# Patient Record
Sex: Male | Born: 1940 | Race: White | Hispanic: No | Smoking: Former smoker
Health system: Southern US, Community
[De-identification: ages and names within clinical notes are randomized; demographics above are authoritative.]

## PROBLEM LIST (undated history)

## (undated) DIAGNOSIS — N189 Chronic kidney disease, unspecified: Secondary | ICD-10-CM

## (undated) DIAGNOSIS — C801 Malignant (primary) neoplasm, unspecified: Secondary | ICD-10-CM

## (undated) DIAGNOSIS — J45909 Unspecified asthma, uncomplicated: Secondary | ICD-10-CM

## (undated) DIAGNOSIS — I10 Essential (primary) hypertension: Secondary | ICD-10-CM

## (undated) DIAGNOSIS — I509 Heart failure, unspecified: Secondary | ICD-10-CM

## (undated) DIAGNOSIS — J449 Chronic obstructive pulmonary disease, unspecified: Secondary | ICD-10-CM

## (undated) DIAGNOSIS — K709 Alcoholic liver disease, unspecified: Secondary | ICD-10-CM

## (undated) DIAGNOSIS — E119 Type 2 diabetes mellitus without complications: Secondary | ICD-10-CM

## (undated) DIAGNOSIS — I739 Peripheral vascular disease, unspecified: Secondary | ICD-10-CM

## (undated) DIAGNOSIS — Z944 Liver transplant status: Secondary | ICD-10-CM

## (undated) DIAGNOSIS — K219 Gastro-esophageal reflux disease without esophagitis: Secondary | ICD-10-CM

## (undated) DIAGNOSIS — E785 Hyperlipidemia, unspecified: Secondary | ICD-10-CM

## (undated) HISTORY — PX: HERNIA REPAIR: SHX51

## (undated) HISTORY — DX: Liver transplant status: Z94.4

## (undated) HISTORY — DX: Chronic obstructive pulmonary disease, unspecified: J44.9

## (undated) HISTORY — DX: Heart failure, unspecified: I50.9

## (undated) HISTORY — PX: IR ANGIOGRAM EXTREMITY BILATERAL: IMG653

## (undated) HISTORY — PX: CAROTID ENDARTERECTOMY: SUR193

## (undated) HISTORY — PX: TONSILLECTOMY: SUR1361

## (undated) HISTORY — DX: Unspecified asthma, uncomplicated: J45.909

## (undated) HISTORY — PX: CHOLECYSTECTOMY: SHX55

## (undated) HISTORY — DX: Hyperlipidemia, unspecified: E78.5

## (undated) HISTORY — DX: Chronic kidney disease, unspecified: N18.9

## (undated) HISTORY — DX: Type 2 diabetes mellitus without complications: E11.9

---

## 1975-11-01 HISTORY — PX: VASECTOMY: SHX75

## 2001-10-31 HISTORY — PX: LIVER TRANSPLANT: SHX410

## 2011-07-18 DIAGNOSIS — E119 Type 2 diabetes mellitus without complications: Secondary | ICD-10-CM | POA: Insufficient documentation

## 2011-07-18 DIAGNOSIS — K7689 Other specified diseases of liver: Secondary | ICD-10-CM | POA: Insufficient documentation

## 2011-07-18 DIAGNOSIS — Z944 Liver transplant status: Secondary | ICD-10-CM | POA: Insufficient documentation

## 2011-07-18 DIAGNOSIS — E1121 Type 2 diabetes mellitus with diabetic nephropathy: Secondary | ICD-10-CM | POA: Insufficient documentation

## 2011-08-12 DIAGNOSIS — N1832 Chronic kidney disease, stage 3b: Secondary | ICD-10-CM | POA: Insufficient documentation

## 2011-08-12 DIAGNOSIS — F329 Major depressive disorder, single episode, unspecified: Secondary | ICD-10-CM | POA: Insufficient documentation

## 2011-08-12 DIAGNOSIS — M109 Gout, unspecified: Secondary | ICD-10-CM | POA: Insufficient documentation

## 2011-08-12 DIAGNOSIS — E785 Hyperlipidemia, unspecified: Secondary | ICD-10-CM | POA: Insufficient documentation

## 2011-08-12 DIAGNOSIS — F39 Unspecified mood [affective] disorder: Secondary | ICD-10-CM | POA: Insufficient documentation

## 2011-08-12 DIAGNOSIS — I10 Essential (primary) hypertension: Secondary | ICD-10-CM | POA: Insufficient documentation

## 2011-08-14 DIAGNOSIS — F17210 Nicotine dependence, cigarettes, uncomplicated: Secondary | ICD-10-CM | POA: Insufficient documentation

## 2011-08-14 DIAGNOSIS — E669 Obesity, unspecified: Secondary | ICD-10-CM | POA: Insufficient documentation

## 2011-08-14 DIAGNOSIS — C4490 Unspecified malignant neoplasm of skin, unspecified: Secondary | ICD-10-CM | POA: Insufficient documentation

## 2011-08-14 DIAGNOSIS — M8580 Other specified disorders of bone density and structure, unspecified site: Secondary | ICD-10-CM | POA: Insufficient documentation

## 2015-08-20 DIAGNOSIS — I739 Peripheral vascular disease, unspecified: Secondary | ICD-10-CM | POA: Insufficient documentation

## 2017-01-05 DIAGNOSIS — D849 Immunodeficiency, unspecified: Secondary | ICD-10-CM | POA: Insufficient documentation

## 2018-10-10 ENCOUNTER — Other Ambulatory Visit (INDEPENDENT_AMBULATORY_CARE_PROVIDER_SITE_OTHER): Payer: Self-pay | Admitting: Nurse Practitioner

## 2018-10-10 ENCOUNTER — Ambulatory Visit (INDEPENDENT_AMBULATORY_CARE_PROVIDER_SITE_OTHER): Payer: Medicare Other | Admitting: Nurse Practitioner

## 2018-10-10 ENCOUNTER — Other Ambulatory Visit: Payer: Self-pay

## 2018-10-10 ENCOUNTER — Ambulatory Visit (INDEPENDENT_AMBULATORY_CARE_PROVIDER_SITE_OTHER): Payer: Medicare Other

## 2018-10-10 ENCOUNTER — Encounter (INDEPENDENT_AMBULATORY_CARE_PROVIDER_SITE_OTHER): Payer: Self-pay | Admitting: Nurse Practitioner

## 2018-10-10 VITALS — BP 140/75 | HR 97 | Resp 18 | Ht 67.0 in | Wt 210.8 lb

## 2018-10-10 DIAGNOSIS — E785 Hyperlipidemia, unspecified: Secondary | ICD-10-CM

## 2018-10-10 DIAGNOSIS — I739 Peripheral vascular disease, unspecified: Secondary | ICD-10-CM | POA: Diagnosis not present

## 2018-10-10 DIAGNOSIS — F17200 Nicotine dependence, unspecified, uncomplicated: Secondary | ICD-10-CM | POA: Diagnosis not present

## 2018-10-10 DIAGNOSIS — I10 Essential (primary) hypertension: Secondary | ICD-10-CM

## 2018-10-10 NOTE — Progress Notes (Signed)
Subjective:    Patient ID: Brian Pacheco, male    DOB: 1941-01-11, 77 y.o.   MRN: [id number] Chief Complaint  Patient presents with  . New Patient (Initial Visit)    consult    HPI  Brian Pacheco is a 77 y.o. male that is referred by Dr. Jimmie Molly for complaints of bilateral lower extremity claudication.  The patient is relocating from [facility] and has an extensive vascular history.  The patient states that he has had stents placed in both lower extremities.  He states that a stent in his right lower extremity was placed approximately 8 years ago and that he had 2-3 placed in his left.  He also had a recent left carotid endarterectomy which he estimates have been in early 2018 or late 2017.  The patient is seen for evaluation of painful lower extremities and diminished pulses. Patient notes the pain is always associated with activity and is very consistent day today. Typically, the pain occurs at less than one block, progress is as activity continues to the point that the patient must stop walking. Resting including standing still for several minutes allowed resumption of the activity and the ability to walk a similar distance before stopping again. Uneven terrain and inclined shorten the distance. The pain has been progressive over the past several years. The patient states the inability to walk is now having a profound negative impact on quality of life and daily activities.  The patient denies rest pain or dangling of an extremity off the side of the bed during the night for relief. No open wounds or sores at this time.  No history of back problems or DJD of the lumbar sacral spine.   The patient denies changes in claudication symptoms or new rest pain symptoms.  No new ulcers or wounds of the foot.  The patient is a current smoker.  The patient's blood pressure has been stable and relatively well controlled. The patient denies amaurosis fugax or recent TIA symptoms. There are no  recent neurological changes noted. The patient denies history of DVT, PE or superficial thrombophlebitis. The patient denies recent episodes of angina or shortness of breath.   The patient underwent bilateral ABIs today which revealed an ABI of 0.84 on the right and an ABI of 0.47 on the left.  His TBI's were 0.49 on the right and 0.00 on the left.  His tibial waveforms in the left lower extremity were monophasic with a nearly flat toe waveform.  The right lower extremity tibials have biphasic waveforms. Past Medical History:  Diagnosis Date  . Asthma   . Diabetes mellitus without complication ([place])   . Hyperlipidemia     Past Surgical History:  Procedure Laterality Date  . APPENDECTOMY    . CAROTID ENDARTERECTOMY    . IR ANGIOGRAM EXTREMITY BILATERAL Bilateral    patient states he has stents in both legs    Social History   Socioeconomic History  . Marital status: Unknown    Spouse name: Not on file  . Number of children: Not on file  . Years of education: Not on file  . Highest education level: Not on file  Occupational History  . Not on file  Social Needs  . Financial resource strain: Not on file  . Food insecurity:    Worry: Not on file    Inability: Not on file  . Transportation needs:    Medical: Not on file    Non-medical: Not on file  Tobacco  Use  . Smoking status: Current Every Day Smoker  . Smokeless tobacco: Never Used  Substance and Sexual Activity  . Alcohol use: Not on file  . Drug use: Not on file  . Sexual activity: Not on file  Lifestyle  . Physical activity:    Days per week: Not on file    Minutes per session: Not on file  . Stress: Not on file  Relationships  . Social connections:    Talks on phone: Not on file    Gets together: Not on file    Attends religious service: Not on file    Active member of club or organization: Not on file    Attends meetings of clubs or organizations: Not on file    Relationship status: Not on file  . Intimate  partner violence:    Fear of current or ex partner: Not on file    Emotionally abused: Not on file    Physically abused: Not on file    Forced sexual activity: Not on file  Other Topics Concern  . Not on file  Social History Narrative  . Not on file    History reviewed. No pertinent family history.  Allergies  Allergen Reactions  . Sitagliptin Other (See Comments)    blisters     Review of Systems   Review of Systems: Negative Unless Checked Constitutional: [] Weight loss  [] Fever  [] Chills Cardiac: [] Chest pain   []  Atrial Fibrillation  [] Palpitations   [] Shortness of breath when laying flat   [] Shortness of breath with exertion. Vascular:  [x] Pain in legs with walking   [x] Pain in legs with standing  [] History of DVT   [] Phlebitis   [] Swelling in legs   [x] Varicose veins   [] Non-healing ulcers Pulmonary:   [] Uses home oxygen   [] Productive cough   [] Hemoptysis   [] Wheeze  [] COPD   [] Asthma Neurologic:  [] Dizziness   [] Seizures   [] History of stroke   [] History of TIA  [] Aphasia   [] Vissual changes   [] Weakness or numbness in arm   [x] Weakness or numbness in leg Musculoskeletal:   [] Joint swelling   [] Joint pain   [] Low back pain  []  History of Knee Replacement Hematologic:  [] Easy bruising  [] Easy bleeding   [] Hypercoagulable state   [] Anemic Gastrointestinal:  [] Diarrhea   [] Vomiting  [] Gastroesophageal reflux/heartburn   [] Difficulty swallowing. Genitourinary:  [] Chronic kidney disease   [] Difficult urination  [] Anuric   [] Blood in urine Skin:  [] Rashes   [] Ulcers  Psychological:  [] History of anxiety   []  History of major depression  []  Memory Difficulties     Objective:   Physical Exam  BP 140/75 (BP Location: Right Arm, Patient Position: Sitting)   Pulse 97   Resp 18   Ht 5\' 7"  (1.702 m)   Wt 210 lb 12.8 oz (95.6 kg)   BMI 33.02 kg/m   Gen: WD/WN, NAD Head: [place]/AT, No temporalis wasting.  Ear/Nose/Throat: Hearing grossly intact, nares w/o erythema or  drainage Eyes: PER, EOMI, sclera nonicteric.  Neck: Supple, no masses.  No JVD.  Pulmonary:  Good air movement, no use of accessory muscles.  Cardiac: RRR Vascular:  Vessel Right Left  Radial Palpable Palpable  Dorsalis Pedis Not Palpable Not Palpable  Posterior Tibial Not Palpable Not Palpable   Gastrointestinal: soft, non-distended. No guarding/no peritoneal signs.  Musculoskeletal: M/S 5/5 throughout.  No deformity or atrophy.  Neurologic: Pain and light touch intact in extremities.  Symmetrical.  Speech is fluent. Motor exam as  listed above. Psychiatric: Judgment intact, Mood & affect appropriate for pt's clinical situation. Dermatologic: No Venous rashes. No Ulcers Noted.  No changes consistent with cellulitis. Lymph : No Cervical lymphadenopathy, no lichenification or skin changes of chronic lymphedema.      Assessment & Plan:   1. 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) The patient underwent bilateral ABIs today which revealed an ABI of 0.84 on the right and an ABI of 0.47 on the left.  His TBI's were 0.49 on the right and 0.00 on the left.  His tibial waveforms in the left lower extremity were monophasic with a nearly flat toe waveform.  The right lower extremity tibials have biphasic waveforms.  Recommend:  The patient has experienced increased symptoms and is now describing lifestyle limiting claudication and mild rest pain.  Given the severity of the patient's left lower extremity symptoms the patient should undergo angiography and intervention.  Risk and benefits were reviewed the patient.  Indications for the procedure were reviewed.  All questions were answered, the patient agrees to proceed.   The patient should continue walking and begin a more formal exercise program.  The patient should continue antiplatelet therapy and aggressive treatment of the lipid abnormalities  I also counseled the patient to stop smoking as this may decrease the  length of effectiveness of interventions.  He states that he is currently enrolled in a smoking cessation clinic.  The patient will follow up with me after the angiogram.   2. Essential hypertension Continue antihypertensive medications as already ordered, these medications have been reviewed and there are no changes at this time.   3. Dyslipidemia Continue statin as ordered and reviewed, no changes at this time    Current Outpatient Medications on File Prior to Visit  Medication Sig Dispense Refill  . fenofibrate 160 MG tablet Take 160 mg by mouth daily.     Marland Kitchen glipiZIDE (GLUCOTROL) 5 MG tablet Take 5 mg by mouth 2 (two) times daily before a meal.     . metFORMIN (GLUCOPHAGE) 500 MG tablet Take 500 mg by mouth 2 (two) times daily with a meal.     . omeprazole (PRILOSEC) 20 MG capsule Take 20 mg by mouth daily.     . rosuvastatin (CRESTOR) 5 MG tablet Take 5 mg by mouth at bedtime.     Marland Kitchen albuterol (PROVENTIL HFA;VENTOLIN HFA) 108 (90 Base) MCG/ACT inhaler Inhale into the lungs.    Marland Kitchen amLODipine (NORVASC) 5 MG tablet Take 5 mg by mouth daily.     . clopidogrel (PLAVIX) 75 MG tablet Take 75 mg by mouth daily.     . insulin aspart (NOVOLOG) 100 UNIT/ML FlexPen Inject into the skin. Sliding scale    . insulin glargine (LANTUS) 100 UNIT/ML injection Inject into the skin. Sliding scale    . losartan (COZAAR) 100 MG tablet Take 100 mg by mouth daily.     . mycophenolate (CELLCEPT) 500 MG tablet Take 500 mg by mouth 2 (two) times daily.     . sildenafil (VIAGRA) 100 MG tablet Take by mouth.    . tamsulosin (FLOMAX) 0.4 MG CAPS capsule Take 0.4 mg by mouth daily.     . traMADol (ULTRAM) 50 MG tablet Take by mouth.    . venlafaxine (EFFEXOR) 75 MG tablet Take 75 mg by mouth 2 (two) times daily with a meal.      No current facility-administered medications on file prior to visit.     There are no Patient  Instructions on file for this visit. No follow-ups on file.   Kris Hartmann,  NP  This note was completed with Sales executive.  Any errors are purely unintentional.

## 2018-10-11 ENCOUNTER — Encounter (INDEPENDENT_AMBULATORY_CARE_PROVIDER_SITE_OTHER): Payer: Self-pay

## 2018-10-11 ENCOUNTER — Telehealth (INDEPENDENT_AMBULATORY_CARE_PROVIDER_SITE_OTHER): Payer: Self-pay

## 2018-10-11 NOTE — Telephone Encounter (Signed)
Spoke with the patient and gave him pre-procedure instructions as well as the day/time of his procedure and pre-op. Patient is scheduled for 1219/19, arrival time 8:15 am. Pre-op is 10/16/18 arrival time 10:45 am.

## 2018-10-15 ENCOUNTER — Encounter (INDEPENDENT_AMBULATORY_CARE_PROVIDER_SITE_OTHER): Payer: Medicare Other

## 2018-10-15 ENCOUNTER — Encounter (INDEPENDENT_AMBULATORY_CARE_PROVIDER_SITE_OTHER): Payer: Medicare Other | Admitting: Vascular Surgery

## 2018-10-15 NOTE — Telephone Encounter (Signed)
Patient called back and was given his instructions again as to where and when of his pre-admit appt.

## 2018-10-16 ENCOUNTER — Encounter
Admission: RE | Admit: 2018-10-16 | Discharge: 2018-10-16 | Disposition: A | Discharge status: Home / Self Care | Payer: Medicare Other | Source: Ambulatory Visit | Attending: Vascular Surgery | Admitting: Vascular Surgery

## 2018-10-16 ENCOUNTER — Other Ambulatory Visit (INDEPENDENT_AMBULATORY_CARE_PROVIDER_SITE_OTHER): Payer: Self-pay | Admitting: Nurse Practitioner

## 2018-10-16 ENCOUNTER — Other Ambulatory Visit: Payer: Self-pay

## 2018-10-16 DIAGNOSIS — E119 Type 2 diabetes mellitus without complications: Secondary | ICD-10-CM | POA: Diagnosis not present

## 2018-10-16 DIAGNOSIS — Z01812 Encounter for preprocedural laboratory examination: Secondary | ICD-10-CM

## 2018-10-16 DIAGNOSIS — Z95820 Peripheral vascular angioplasty status with implants and grafts: Secondary | ICD-10-CM | POA: Diagnosis not present

## 2018-10-16 DIAGNOSIS — Z888 Allergy status to other drugs, medicaments and biological substances status: Secondary | ICD-10-CM | POA: Diagnosis not present

## 2018-10-16 DIAGNOSIS — Z794 Long term (current) use of insulin: Secondary | ICD-10-CM | POA: Diagnosis not present

## 2018-10-16 DIAGNOSIS — J45909 Unspecified asthma, uncomplicated: Secondary | ICD-10-CM | POA: Diagnosis not present

## 2018-10-16 DIAGNOSIS — F17200 Nicotine dependence, unspecified, uncomplicated: Secondary | ICD-10-CM | POA: Diagnosis not present

## 2018-10-16 DIAGNOSIS — I70212 Atherosclerosis of native arteries of extremities with intermittent claudication, left leg: Secondary | ICD-10-CM | POA: Diagnosis not present

## 2018-10-16 DIAGNOSIS — E785 Hyperlipidemia, unspecified: Secondary | ICD-10-CM | POA: Diagnosis not present

## 2018-10-16 DIAGNOSIS — Z7902 Long term (current) use of antithrombotics/antiplatelets: Secondary | ICD-10-CM | POA: Diagnosis not present

## 2018-10-16 DIAGNOSIS — Z79899 Other long term (current) drug therapy: Secondary | ICD-10-CM | POA: Diagnosis not present

## 2018-10-16 DIAGNOSIS — I10 Essential (primary) hypertension: Secondary | ICD-10-CM | POA: Diagnosis not present

## 2018-10-16 HISTORY — DX: Gastro-esophageal reflux disease without esophagitis: K21.9

## 2018-10-16 HISTORY — DX: Peripheral vascular disease, unspecified: I73.9

## 2018-10-16 LAB — CREATININE, SERUM
Creatinine, Ser: 1.6 mg/dL — ABNORMAL HIGH (ref 0.61–1.24)
GFR calc non Af Amer: 42 mL/min — ABNORMAL LOW (ref >60)
GFR, EST AFRICAN AMERICAN: 48 mL/min — AB (ref >60)

## 2018-10-16 LAB — BUN: BUN: 28 mg/dL — ABNORMAL HIGH (ref 8–23)

## 2018-10-16 NOTE — Pre-Procedure Instructions (Signed)
Abnormal labs faxed to Dr Bunnie Domino office

## 2018-10-17 MED ORDER — DEXTROSE 5 % IV SOLN
2.0000 g | Freq: Once | INTRAVENOUS | Status: AC
  Administered 2018-10-18: 2 g via INTRAVENOUS
  Filled 2018-10-17: qty 20

## 2018-10-18 ENCOUNTER — Ambulatory Visit
Admission: RE | Admit: 2018-10-18 | Discharge: 2018-10-18 | Disposition: A | Discharge status: Home / Self Care | Payer: Medicare Other | Attending: Vascular Surgery | Admitting: Vascular Surgery

## 2018-10-18 ENCOUNTER — Encounter
Admission: RE | Disposition: A | Discharge status: Home / Self Care | Payer: Self-pay | Source: Home / Self Care | Attending: Vascular Surgery

## 2018-10-18 ENCOUNTER — Other Ambulatory Visit: Payer: Self-pay

## 2018-10-18 ENCOUNTER — Encounter: Payer: Self-pay | Admitting: *Deleted

## 2018-10-18 DIAGNOSIS — I70219 Atherosclerosis of native arteries of extremities with intermittent claudication, unspecified extremity: Secondary | ICD-10-CM

## 2018-10-18 DIAGNOSIS — Z79899 Other long term (current) drug therapy: Secondary | ICD-10-CM | POA: Insufficient documentation

## 2018-10-18 DIAGNOSIS — Z95820 Peripheral vascular angioplasty status with implants and grafts: Secondary | ICD-10-CM | POA: Insufficient documentation

## 2018-10-18 DIAGNOSIS — F17200 Nicotine dependence, unspecified, uncomplicated: Secondary | ICD-10-CM | POA: Insufficient documentation

## 2018-10-18 DIAGNOSIS — Z888 Allergy status to other drugs, medicaments and biological substances status: Secondary | ICD-10-CM | POA: Insufficient documentation

## 2018-10-18 DIAGNOSIS — Z794 Long term (current) use of insulin: Secondary | ICD-10-CM | POA: Insufficient documentation

## 2018-10-18 DIAGNOSIS — I70212 Atherosclerosis of native arteries of extremities with intermittent claudication, left leg: Secondary | ICD-10-CM | POA: Diagnosis not present

## 2018-10-18 DIAGNOSIS — I10 Essential (primary) hypertension: Secondary | ICD-10-CM | POA: Insufficient documentation

## 2018-10-18 DIAGNOSIS — I70222 Atherosclerosis of native arteries of extremities with rest pain, left leg: Secondary | ICD-10-CM

## 2018-10-18 DIAGNOSIS — Z7902 Long term (current) use of antithrombotics/antiplatelets: Secondary | ICD-10-CM | POA: Insufficient documentation

## 2018-10-18 DIAGNOSIS — E119 Type 2 diabetes mellitus without complications: Secondary | ICD-10-CM | POA: Insufficient documentation

## 2018-10-18 DIAGNOSIS — J45909 Unspecified asthma, uncomplicated: Secondary | ICD-10-CM | POA: Insufficient documentation

## 2018-10-18 DIAGNOSIS — E785 Hyperlipidemia, unspecified: Secondary | ICD-10-CM | POA: Insufficient documentation

## 2018-10-18 HISTORY — DX: Malignant (primary) neoplasm, unspecified: C80.1

## 2018-10-18 HISTORY — DX: Alcoholic liver disease, unspecified: K70.9

## 2018-10-18 HISTORY — DX: Essential (primary) hypertension: I10

## 2018-10-18 HISTORY — PX: LOWER EXTREMITY ANGIOGRAPHY: CATH118251

## 2018-10-18 LAB — GLUCOSE, CAPILLARY
GLUCOSE-CAPILLARY: 91 mg/dL (ref 70–99)
Glucose-Capillary: 103 mg/dL — ABNORMAL HIGH (ref 70–99)
Glucose-Capillary: 70 mg/dL (ref 70–99)

## 2018-10-18 SURGERY — LOWER EXTREMITY ANGIOGRAPHY
Anesthesia: Moderate Sedation | Site: Leg Lower | Laterality: Left

## 2018-10-18 MED ORDER — HYDROMORPHONE HCL 1 MG/ML IJ SOLN: 1.0000 mg | Freq: Once | INTRAMUSCULAR | Status: DC | PRN

## 2018-10-18 MED ORDER — SODIUM CHLORIDE 0.9 % IV SOLN: INTRAVENOUS | Status: DC

## 2018-10-18 MED ORDER — HEPARIN (PORCINE) IN NACL 1000-0.9 UT/500ML-% IV SOLN
INTRAVENOUS | Status: AC
  Filled 2018-10-18: qty 1000

## 2018-10-18 MED ORDER — ONDANSETRON HCL 4 MG/2ML IJ SOLN: 4.0000 mg | Freq: Four times a day (QID) | INTRAMUSCULAR | Status: DC | PRN

## 2018-10-18 MED ORDER — FENTANYL CITRATE (PF) 100 MCG/2ML IJ SOLN
INTRAMUSCULAR | Status: AC
  Filled 2018-10-18: qty 2

## 2018-10-18 MED ORDER — HEPARIN SODIUM (PORCINE) 1000 UNIT/ML IJ SOLN
INTRAMUSCULAR | Status: AC
  Filled 2018-10-18: qty 1

## 2018-10-18 MED ORDER — HEPARIN SODIUM (PORCINE) 1000 UNIT/ML IJ SOLN
INTRAMUSCULAR | Status: DC | PRN
  Administered 2018-10-18: 5000 [IU] via INTRAVENOUS

## 2018-10-18 MED ORDER — SODIUM CHLORIDE 0.9 % IV SOLN
INTRAVENOUS | Status: DC
  Administered 2018-10-18: 09:00:00 via INTRAVENOUS

## 2018-10-18 MED ORDER — SODIUM CHLORIDE 0.9% FLUSH: 3.0000 mL | INTRAVENOUS | Status: DC | PRN

## 2018-10-18 MED ORDER — SODIUM CHLORIDE 0.9 % IV SOLN: 250.0000 mL | INTRAVENOUS | Status: DC | PRN

## 2018-10-18 MED ORDER — ASPIRIN EC 81 MG PO TBEC: 81.0000 mg | DELAYED_RELEASE_TABLET | Freq: Every day | ORAL | 2 refills | Status: DC

## 2018-10-18 MED ORDER — HYDRALAZINE HCL 20 MG/ML IJ SOLN: 5.0000 mg | INTRAMUSCULAR | Status: DC | PRN

## 2018-10-18 MED ORDER — MIDAZOLAM HCL 2 MG/2ML IJ SOLN
INTRAMUSCULAR | Status: DC | PRN
  Administered 2018-10-18 (×2): 1 mg via INTRAVENOUS
  Administered 2018-10-18: 2 mg via INTRAVENOUS

## 2018-10-18 MED ORDER — SODIUM CHLORIDE 0.9 % IV SOLN
INTRAVENOUS | Status: AC | PRN
  Administered 2018-10-18: 250 mL via INTRAVENOUS

## 2018-10-18 MED ORDER — CEFAZOLIN SODIUM-DEXTROSE 2-4 GM/100ML-% IV SOLN
INTRAVENOUS | Status: AC
  Filled 2018-10-18: qty 100

## 2018-10-18 MED ORDER — MIDAZOLAM HCL 5 MG/5ML IJ SOLN
INTRAMUSCULAR | Status: AC
  Filled 2018-10-18: qty 5

## 2018-10-18 MED ORDER — IOPAMIDOL (ISOVUE-300) INJECTION 61%
INTRAVENOUS | Status: DC | PRN
  Administered 2018-10-18: 90 mL via INTRA_ARTERIAL

## 2018-10-18 MED ORDER — DIPHENHYDRAMINE HCL 50 MG/ML IJ SOLN
INTRAMUSCULAR | Status: DC | PRN
  Administered 2018-10-18: 50 mg via INTRAVENOUS

## 2018-10-18 MED ORDER — ASPIRIN EC 81 MG PO TBEC: 81.0000 mg | DELAYED_RELEASE_TABLET | Freq: Every day | ORAL | Status: DC

## 2018-10-18 MED ORDER — LABETALOL HCL 5 MG/ML IV SOLN: 10.0000 mg | INTRAVENOUS | Status: DC | PRN

## 2018-10-18 MED ORDER — DIPHENHYDRAMINE HCL 50 MG/ML IJ SOLN
INTRAMUSCULAR | Status: AC
  Filled 2018-10-18: qty 1

## 2018-10-18 MED ORDER — ACETAMINOPHEN 325 MG PO TABS: 650.0000 mg | ORAL_TABLET | ORAL | Status: DC | PRN

## 2018-10-18 MED ORDER — FENTANYL CITRATE (PF) 100 MCG/2ML IJ SOLN
INTRAMUSCULAR | Status: DC | PRN
  Administered 2018-10-18 (×3): 50 ug via INTRAVENOUS

## 2018-10-18 MED ORDER — SODIUM CHLORIDE 0.9% FLUSH: 3.0000 mL | Freq: Two times a day (BID) | INTRAVENOUS | Status: DC

## 2018-10-18 MED ORDER — LIDOCAINE-EPINEPHRINE (PF) 1 %-1:200000 IJ SOLN
INTRAMUSCULAR | Status: AC
  Filled 2018-10-18: qty 10

## 2018-10-18 SURGICAL SUPPLY — 24 items
BALLN LUTONIX 018 5X220X130 (BALLOONS) ×3
BALLN LUTONIX 018 6X220X130 (BALLOONS) ×3
BALLN ULTRVRSE 3X300X150 (BALLOONS) ×2
BALLN ULTRVRSE 3X300X150 OTW (BALLOONS) ×1
BALLN ULTRVRSE 6X250X130 (BALLOONS) ×3
BALLOON LUTONIX 018 5X220X130 (BALLOONS) ×1 IMPLANT
BALLOON LUTONIX 018 6X220X130 (BALLOONS) ×1 IMPLANT
BALLOON ULTRVRSE 3X300X150 OTW (BALLOONS) ×1 IMPLANT
BALLOON ULTRVRSE 6X250X130 (BALLOONS) ×1 IMPLANT
CATH BEACON 5 .038 100 VERT TP (CATHETERS) ×3 IMPLANT
CATH CXI SUPP ANG 4FR 135 (CATHETERS) ×1 IMPLANT
CATH CXI SUPP ANG 4FR 135CM (CATHETERS) ×3
CATH PIG 70CM (CATHETERS) ×3 IMPLANT
DEVICE PRESTO INFLATION (MISCELLANEOUS) ×3 IMPLANT
DEVICE STARCLOSE SE CLOSURE (Vascular Products) ×3 IMPLANT
DEVICE TORQUE .025-.038 (MISCELLANEOUS) ×3 IMPLANT
GLIDEWIRE ADV .035X260CM (WIRE) ×3 IMPLANT
PACK ANGIOGRAPHY (CUSTOM PROCEDURE TRAY) ×3 IMPLANT
SHEATH ANL2 6FRX45 HC (SHEATH) ×3 IMPLANT
SHEATH BRITE TIP 5FRX11 (SHEATH) ×3 IMPLANT
STENT VIABAHN 6X250X120 (Permanent Stent) ×3 IMPLANT
TUBING CONTRAST HIGH PRESS 72 (TUBING) ×3 IMPLANT
WIRE G V18X300CM (WIRE) ×3 IMPLANT
WIRE J 3MM .035X145CM (WIRE) ×3 IMPLANT

## 2018-10-18 NOTE — Op Note (Signed)
[facility]  Percutaneous Study/Intervention Procedural Note   Date of Surgery: 10/18/2018  Surgeon(s):Jeanne Diefendorf    Assistants:none  Pre-operative Diagnosis: PAD with claudication and early rest pain LLE  Post-operative diagnosis:  Same  Procedure(s) Performed:             1.  Ultrasound guidance for vascular access right femoral artery             2.  Catheter placement into left common femoral artery from right femoral approach             3.  Aortogram and selective left lower extremity angiogram             4.  Percutaneous transluminal angioplasty of left peroneal artery and tibioperoneal trunk with 3 mm diameter by 30 cm length angioplasty balloon             5.   Percutaneous transluminal angioplasty of the left SFA and popliteal arteries with 5 and 6 mm diameter Lutonix drug-coated angioplasty balloons  6.  Viabahn stent placement to left SFA and popliteal arteries with 6 mm diameter by 25 cm length Viabahn stent  7.  Percutaneous transluminal angioplasty of the left posterior tibial artery and tibioperoneal trunk with 3 mm diameter by 30 cm length angioplasty balloon             8.  StarClose closure device right femoral artery  EBL: 5 cc  Contrast: 90 cc  Fluoro Time: 11 minutes  Moderate Conscious Sedation Time: approximately 50 minutes using 4 mg of Versed and 150 mcg of Fentanyl              Indications:  Patient is a 77 y.o.male with labeling claudication and early rest pain of the left leg.  He has a history of peripheral arterial disease treated elsewhere and has had both lower extremities treated in the past. The patient has noninvasive study showing mildly reduced right ABI but a markedly reduced left ABI of less than 0.5. The patient is brought in for angiography for further evaluation and potential treatment.  Due to the limb threatening nature of the situation, angiogram was performed for attempted limb salvage. The patient is aware that  if the procedure fails, amputation would be expected.  The patient also understands that even with successful revascularization, amputation may still be required due to the severity of the situation. Risks and benefits are discussed and informed consent is obtained.   Procedure:  The patient was identified and appropriate procedural time out was performed.  The patient was then placed supine on the table and prepped and draped in the usual sterile fashion. Moderate conscious sedation was administered during a face to face encounter with the patient throughout the procedure with my supervision of the RN administering medicines and monitoring the patient's vital signs, pulse oximetry, telemetry and mental status throughout from the start of the procedure until the patient was taken to the recovery room. Ultrasound was used to evaluate the right common femoral artery.  It was patent .  A digital ultrasound image was acquired.  A Seldinger needle was used to access the right common femoral artery under direct ultrasound guidance and a permanent image was performed.  A 0.035 J wire was advanced without resistance and a 5Fr sheath was placed.  Pigtail catheter was placed into the aorta and an AP aortogram was performed. This demonstrated that the previously placed iliac stents were patent without significant stenosis.  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  There appeared to be mild left renal artery stenosis but no hemodynamically significant stenosis in either renal artery. I then crossed the aortic bifurcation and advanced to the left femoral head. Selective left lower extremity angiogram was then performed. This demonstrated reasonably normal common femoral artery, profunda femoris artery, and proximal superficial femoral artery.  The mid to distal superficial femoral artery became disease and then occluded several centimeters above the previously placed stent.  The stent terminated in the below-knee popliteal  artery and there was faint reconstitution of the popliteal artery but then occlusion of the distal popliteal artery and tibioperoneal trunk as well as the proximal portions of the peroneal artery and posterior tibial arteries.  The anterior tibial artery was patent proximally, but then occluded in the proximal segment without good reconstitution distally. It was felt that it was in the patient's best interest to proceed with intervention after these images to avoid a second procedure and a larger amount of contrast and fluoroscopy based off of the findings from the initial angiogram. The patient was systemically heparinized and a 6 [place] Ansell sheath was then placed over the [employer] wire. I then used a Kumpe catheter and the advantage wire to navigate through the SFA and popliteal occlusions and down into the area of the tibioperoneal trunk.  The wire initially wanted to go out the anterior tibial artery with each pass, but this was not a good distal runoff target.  Tediously, I was able to advance into the tibioperoneal trunk and then across the occlusion of the tibioperoneal trunk and the peroneal artery confirming intraluminal flow in the peroneal artery beyond the occlusion with a CXI catheter.  I then replaced a 0.018 wire.  A 3 mm diameter by 30 cm length angioplasty balloon was then inflated in the proximal and mid peroneal arteries up to the tibioperoneal trunk up to 8 atm for 1 minute.  I then used a 5 mm diameter by 22 cm length Lutonix drug-coated angioplasty balloon inflated from the below-knee popliteal artery up through Hunter's canal.  This was taken up to 14 atm for a minute.  Proximally, this was somewhat undersized and I used a 6 mm diameter by 22 cm length Lutonix drug-coated angioplasty balloon from the mid to distal SFA down to the midportion of the previously placed stent in the popliteal artery.  This was inflated to 8 atm for 1 minute.  Completion imaging showed greater than 50%  residual stenosis proximal to the previously placed stent as well as within the proximal portion of the previously placed stent that appeared to be a combination of chronic thrombus and plaque.  I elected to place a covered stent in this area and from the mid to distal SFA down to the below-knee popliteal artery a 6 mm diameter by 25 cm length Viabahn stent was then deployed and postdilated with a 6 mm balloon.  Following this, there was less than 10% residual stenosis.  Now, visualization of the posterior tibial artery was better and this being the better runoff distally I proceeded with cannulation of the posterior tibial artery with a Kumpe catheter and the V 18 wire.  I crossed the occlusion of the proximal portion of the posterior tibial artery without difficulty and then confirmed intraluminal flow with a CXI catheter in the midportion of the posterior tibial artery.  The wire was then replaced and treatment was performed.  The 3 mm diameter by 30 cm length angioplasty  balloon was inflated from the mid posterior tibial artery up to the proximal posterior tibial artery in the tibioperoneal trunk.  This was taken to 10 atm for 1 minute.  Completion imaging showed no greater than 10% residual stenosis in the posterior tibial artery or tibioperoneal trunk or within the SFA or popliteal arteries after treatment.  There remained some disease that was likely a combination of thrombus and chronic disease in the peroneal artery and its proximal portion.  I did not feel treating this would be of benefit as it may harm the posterior tibial artery at this point which has now been restored and was widely patent and provide the better runoff distally. I elected to terminate the procedure. The sheath was removed and StarClose closure device was deployed in the right femoral artery with excellent hemostatic result. The patient was taken to the recovery room in stable condition having tolerated the procedure well.  Findings:                Aortogram:  Previously placed iliac stents were patent without significant stenosis.  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  There appeared to be mild left renal artery stenosis but no hemodynamically significant stenosis in either renal artery             Left lower Extremity:  This demonstrated reasonably normal common femoral artery, profunda femoris artery, and proximal superficial femoral artery.  The mid to distal superficial femoral artery became disease and then occluded several centimeters above the previously placed stent.  The stent terminated in the below-knee popliteal artery and there was faint reconstitution of the popliteal artery but then occlusion of the distal popliteal artery and tibioperoneal trunk as well as the proximal portions of the peroneal artery and posterior tibial arteries.  The anterior tibial artery was patent proximally, but then occluded in the proximal segment without good reconstitution distally   Disposition: Patient was taken to the recovery room in stable condition having tolerated the procedure well.  Complications: None  Leotis Pain 10/18/2018 11:03 AM   This note was created with Dragon Medical transcription system. Any errors in dictation are purely unintentional.

## 2018-10-18 NOTE — H&P (Signed)
[facility] History & Physical Update  The patient was interviewed and re-examined.  The patient's previous History and Physical has been reviewed and is unchanged.  There is no change in the plan of care. We plan to proceed with the scheduled procedure.  Leotis Pain, MD  10/18/2018, 8:48 AM

## 2018-10-18 NOTE — Discharge Instructions (Signed)
Moderate Conscious Sedation, Adult, Care After  These instructions provide you with information about caring for yourself after your procedure. Your health care provider may also give you more specific instructions. Your treatment has been planned according to current medical practices, but problems sometimes occur. Call your health care provider if you have any problems or questions after your procedure.  What can I expect after the procedure?  After your procedure, it is common:   To feel sleepy for several hours.   To feel clumsy and have poor balance for several hours.   To have poor judgment for several hours.   To vomit if you eat too soon.  Follow these instructions at home:  For at least 24 hours after the procedure:     Do not:  ? Participate in activities where you could fall or become injured.  ? Drive.  ? Use heavy machinery.  ? Drink alcohol.  ? Take sleeping pills or medicines that cause drowsiness.  ? Make important decisions or sign legal documents.  ? Take care of children on your own.   Rest.  Eating and drinking   Follow the diet recommended by your health care provider.   If you vomit:  ? Drink water, juice, or soup when you can drink without vomiting.  ? Make sure you have little or no nausea before eating solid foods.  General instructions   Have a responsible adult stay with you until you are awake and alert.   Take over-the-counter and prescription medicines only as told by your health care provider.   If you smoke, do not smoke without supervision.   Keep all follow-up visits as told by your health care provider. This is important.  Contact a health care provider if:   You keep feeling nauseous or you keep vomiting.   You feel light-headed.   You develop a rash.   You have a fever.  Get help right away if:   You have trouble breathing.  This information is not intended to replace advice given to you by your health care provider. Make sure you discuss any questions you have  with your health care provider.  Document Released: 08/07/2013 Document Revised: 03/21/2016 Document Reviewed: 02/06/2016  Elsevier Interactive Patient Education  2019 [employer]  Angiogram, Care After  This sheet gives you information about how to care for yourself after your procedure. Your health care provider may also give you more specific instructions. If you have problems or questions, contact your health care provider.  What can I expect after the procedure?  After the procedure, it is common to have bruising and tenderness at the catheter insertion area.  Follow these instructions at home:  Insertion site care   Follow instructions from your health care provider about how to take care of your insertion site. Make sure you:  ? Wash your hands with soap and water before you change your bandage (dressing). If soap and water are not available, use hand sanitizer.  ? Change your dressing as told by your health care provider.  ? Leave stitches (sutures), skin glue, or adhesive strips in place. These skin closures may need to stay in place for 2 weeks or longer. If adhesive strip edges start to loosen and curl up, you may trim the loose edges. Do not remove adhesive strips completely unless your health care provider tells you to do that.   Do not take baths, swim, or use a hot tub until your health care provider   approves.   You may shower 24-48 hours after the procedure or as told by your health care provider.  ? Gently wash the site with plain soap and water.  ? Pat the area dry with a clean towel.  ? Do not rub the site. This may cause bleeding.   Do not apply powder or lotion to the site. Keep the site clean and dry.   Check your insertion site every day for signs of infection. Check for:  ? Redness, swelling, or pain.  ? Fluid or blood.  ? Warmth.  ? Pus or a bad smell.  Activity   Rest as told by your health care provider, usually for 1-2 days.   Do not lift anything that is heavier than 10 lbs.  (4.5 kg) or as told by your health care provider.   Do not drive for 24 hours if you were given a medicine to help you relax (sedative).   Do not drive or use heavy machinery while taking prescription pain medicine.  General instructions     Return to your normal activities as told by your health care provider, usually in about a week. Ask your health care provider what activities are safe for you.   If the catheter site starts bleeding, lie flat and put pressure on the site. If the bleeding does not stop, get help right away. This is a medical emergency.   Drink enough fluid to keep your urine clear or pale yellow. This helps flush the contrast dye from your body.   Take over-the-counter and prescription medicines only as told by your health care provider.   Keep all follow-up visits as told by your health care provider. This is important.  Contact a health care provider if:   You have a fever or chills.   You have redness, swelling, or pain around your insertion site.   You have fluid or blood coming from your insertion site.   The insertion site feels warm to the touch.   You have pus or a bad smell coming from your insertion site.   You have bruising around the insertion site.   You notice blood collecting in the tissue around the catheter site (hematoma). The hematoma may be painful to the touch.  Get help right away if:   You have severe pain at the catheter insertion area.   The catheter insertion area swells very fast.   The catheter insertion area is bleeding, and the bleeding does not stop when you hold steady pressure on the area.   The area near or just beyond the catheter insertion site becomes pale, cool, tingly, or numb.  These symptoms may represent a serious problem that is an emergency. Do not wait to see if the symptoms will go away. Get medical help right away. Call your local emergency services (911 in the [place] Do not drive yourself to the hospital.  Summary   After the  procedure, it is common to have bruising and tenderness at the catheter insertion area.   After the procedure, it is important to rest and drink plenty of fluids.   Do not take baths, swim, or use a hot tub until your health care provider says it is okay to do so. You may shower 24-48 hours after the procedure or as told by your health care provider.   If the catheter site starts bleeding, lie flat and put pressure on the site. If the bleeding does not stop, get help right away.   This is a medical emergency.  This information is not intended to replace advice given to you by your health care provider. Make sure you discuss any questions you have with your health care provider.  Document Released: 05/05/2005 Document Revised: 09/21/2016 Document Reviewed: 09/21/2016  Elsevier Interactive Patient Education  2019 [employer]

## 2018-10-29 ENCOUNTER — Telehealth (INDEPENDENT_AMBULATORY_CARE_PROVIDER_SITE_OTHER): Payer: Self-pay

## 2018-10-29 NOTE — Telephone Encounter (Signed)
Spoke with patient and gave the recommendation from Dr. Lucky Cowboy and let the patient know that if his swelling got worse to give [place] a call back.

## 2018-10-29 NOTE — Telephone Encounter (Signed)
Patient called stating that 3 days after his left leg angio on 10/18/18, his left leg started swelling up to the knee. Patient states he does elevate his leg and it goes down at night, but swells in the morning.

## 2018-11-14 ENCOUNTER — Other Ambulatory Visit (INDEPENDENT_AMBULATORY_CARE_PROVIDER_SITE_OTHER): Payer: Self-pay | Admitting: Vascular Surgery

## 2018-11-14 DIAGNOSIS — Z95820 Peripheral vascular angioplasty status with implants and grafts: Secondary | ICD-10-CM

## 2018-11-15 ENCOUNTER — Ambulatory Visit (INDEPENDENT_AMBULATORY_CARE_PROVIDER_SITE_OTHER): Payer: Medicare Other

## 2018-11-15 ENCOUNTER — Ambulatory Visit (INDEPENDENT_AMBULATORY_CARE_PROVIDER_SITE_OTHER): Payer: Medicare Other | Admitting: Nurse Practitioner

## 2018-11-15 ENCOUNTER — Encounter (INDEPENDENT_AMBULATORY_CARE_PROVIDER_SITE_OTHER): Payer: Self-pay | Admitting: Nurse Practitioner

## 2018-11-15 VITALS — BP 140/81 | HR 93 | Resp 18 | Ht 67.0 in | Wt 212.2 lb

## 2018-11-15 DIAGNOSIS — I10 Essential (primary) hypertension: Secondary | ICD-10-CM | POA: Diagnosis not present

## 2018-11-15 DIAGNOSIS — Z95820 Peripheral vascular angioplasty status with implants and grafts: Secondary | ICD-10-CM

## 2018-11-15 DIAGNOSIS — E785 Hyperlipidemia, unspecified: Secondary | ICD-10-CM

## 2018-11-15 DIAGNOSIS — I739 Peripheral vascular disease, unspecified: Secondary | ICD-10-CM

## 2018-11-15 NOTE — Progress Notes (Signed)
Subjective:    Patient ID: Brian Pacheco, male    DOB: [birth year], 78 y.o.   MRN: [id number] Chief Complaint  Patient presents with  . Follow-up    HPI  Brian Pacheco is a 78 y.o. male The patient returns to the office for followup and review status post angiogram with intervention. The patient notes improvement in the lower extremity symptoms. No interval shortening of the patient's claudication distance or rest pain symptoms. Previous wounds have now healed.  No new ulcers or wounds have occurred since the last visit.  There have been no significant changes to the patient's overall health care.  The patient denies amaurosis fugax or recent TIA symptoms. There are no recent neurological changes noted. The patient denies history of DVT, PE or superficial thrombophlebitis. The patient denies recent episodes of angina or shortness of breath.   ABI's Rt=0.61 and Lt=0.94  (previous ABI's Rt=0.84 and Lt=0.47) TBI's Rt=0.65 and Lt=0.65  (previous TBI's Rt=0.49 and Lt=0.00) The patient has triphasic tibial waveforms of the left lower extremity with strong toe waveforms, the right lower extremity has biphasic waveforms in the tibial arteries.  Past Medical History:  Diagnosis Date  . Asthma   . Cancer ([place])    skin cancer  . Diabetes mellitus without complication ([place])   . GERD (gastroesophageal reflux disease)   . Hyperlipidemia   . Hypertension   . 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 Peripheral vascular disease [facility])     Past Surgical History:  Procedure Laterality Date  . CAROTID ENDARTERECTOMY    . CHOLECYSTECTOMY    . HERNIA REPAIR     umbilical incarcerated  . IR ANGIOGRAM EXTREMITY BILATERAL Bilateral    patient states he has stents in both legs  . LIVER TRANSPLANT  2003  . LOWER EXTREMITY ANGIOGRAPHY Left 10/18/2018   Procedure: LOWER EXTREMITY ANGIOGRAPHY;  Surgeon: Algernon Huxley, MD;  Location: [place] [facility];  Service:  Cardiovascular;  Laterality: Left;  . TONSILLECTOMY    . VASECTOMY  1977    Social History   Socioeconomic History  . Marital status: Married    Spouse name: Not on file  . Number of children: Not on file  . Years of education: Not on file  . Highest education level: Not on file  Occupational History  . Not on file  Social Needs  . Financial resource strain: Not on file  . Food insecurity:    Worry: Not on file    Inability: Not on file  . Transportation needs:    Medical: Not on file    Non-medical: Not on file  Tobacco Use  . Smoking status: Former Smoker    Packs/day: 0.25    Years: 60.00    Pack years: 15.00    Last attempt to quit: 10/15/2018    Years since quitting: 0.0  . Smokeless tobacco: Never Used  . Tobacco comment: currently going to smoking cessation clinic at [facility], smoked 1 ppd until recently, down to 7 a day  Substance and Sexual Activity  . Alcohol use: Not Currently    Comment: history of alcohol abuse quit 2003  . Drug use: Never  . Sexual activity: Not on file  Lifestyle  . Physical activity:    Days per week: Not on file    Minutes per session: Not on file  . Stress: Not on file  Relationships  . Social connections:    Talks on  phone: Not on file    Gets together: Not on file    Attends religious service: Not on file    Active member of club or organization: Not on file    Attends meetings of clubs or organizations: Not on file    Relationship status: Not on file  . Intimate partner violence:    Fear of current or ex partner: Not on file    Emotionally abused: Not on file    Physically abused: Not on file    Forced sexual activity: Not on file  Other Topics Concern  . Not on file  Social History Narrative  . Not on file    History reviewed. No pertinent family history.  Allergies  Allergen Reactions  . Sitagliptin Other (See Comments)    blisters     Review of Systems   Review of Systems: Negative Unless  Checked Constitutional: [] Weight loss  [] Fever  [] Chills Cardiac: [] Chest pain   []  Atrial Fibrillation  [] Palpitations   [] Shortness of breath when laying flat   [] Shortness of breath with exertion. [] Shortness of breath at rest Vascular:  [] Pain in legs with walking   [] Pain in legs with standing [] Pain in legs when laying flat   [] Claudication    [] Pain in feet when laying flat    [] History of DVT   [] Phlebitis   [x] Swelling in legs   [x] Varicose veins   [] Non-healing ulcers Pulmonary:   [] Uses home oxygen   [] Productive cough   [] Hemoptysis   [] Wheeze  [] COPD   [] Asthma Neurologic:  [] Dizziness   [] Seizures  [] Blackouts [] History of stroke   [] History of TIA  [] Aphasia   [] Temporary Blindness   [] Weakness or numbness in arm   [] Weakness or numbness in leg Musculoskeletal:   [] Joint swelling   [] Joint pain   [] Low back pain  []  History of Knee Replacement [] Arthritis [] back Surgeries  []  Spinal Stenosis    Hematologic:  [] Easy bruising  [] Easy bleeding   [] Hypercoagulable state   [] Anemic Gastrointestinal:  [] Diarrhea   [] Vomiting  [] Gastroesophageal reflux/heartburn   [] Difficulty swallowing. [] Abdominal pain Genitourinary:  [] Chronic kidney disease   [] Difficult urination  [] Anuric   [] Blood in urine [] Frequent urination  [] Burning with urination   [] Hematuria Skin:  [] Rashes   [] Ulcers [] Wounds Psychological:  [] History of anxiety   []  History of major depression  []  Memory Difficulties     Objective:   Physical Exam  BP 140/81 (BP Location: Right Arm, Patient Position: Sitting)   Pulse 93   Resp 18   Ht 5\' 7"  (1.702 m)   Wt 212 lb 3.2 oz (96.3 kg)   BMI 33.24 kg/m   Gen: WD/WN, NAD Head: [place]/AT, No temporalis wasting.  Ear/Nose/Throat: Hearing grossly intact, nares w/o erythema or drainage Eyes: PER, EOMI, sclera nonicteric.  Neck: Supple, no masses.  No JVD.  Pulmonary:  Good air movement, no use of accessory muscles.  Cardiac: RRR Vascular:  1+ soft edema on left lower  extremity Vessel Right Left  Radial Palpable Palpable  Dorsalis Pedis  not palpable Palpable  Posterior Tibial  not palpable Palpable   Gastrointestinal: soft, non-distended. No guarding/no peritoneal signs.  Musculoskeletal: M/S 5/5 throughout.  No deformity or atrophy.  Neurologic: Pain and light touch intact in extremities.  Symmetrical.  Speech is fluent. Motor exam as listed above. Psychiatric: Judgment intact, Mood & affect appropriate for pt's clinical situation. Dermatologic: No Venous rashes. No Ulcers Noted.  No changes consistent with cellulitis. Lymph : No  Cervical lymphadenopathy, no lichenification or skin changes of chronic lymphedema.      Assessment & Plan:   1. 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  Recommend:  The patient is status post successful angiogram with intervention.  The patient reports that the claudication symptoms and leg pain is essentially gone.   The patient denies lifestyle limiting changes at this point in time.  No further invasive studies, angiography or surgery at this time The patient should continue walking and begin a more formal exercise program.  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  Patient should undergo noninvasive studies as ordered in 3 months. The patient will follow up with me after the studies.   - VAS [place] ABI WITH/WO TBI; Future  2. Dyslipidemia Continue statin as  ordered and reviewed, no changes at this time   3. Essential hypertension Continue antihypertensive medications as already ordered, these medications have been reviewed and there are no changes at this time.    Current Outpatient Medications on File Prior to Visit  Medication Sig Dispense Refill  . albuterol (PROVENTIL HFA;VENTOLIN HFA) 108 (90 Base) MCG/ACT inhaler Inhale into the lungs.    Marland Kitchen amLODipine (NORVASC) 5 MG tablet Take 5 mg by mouth daily.     Marland Kitchen aspirin EC 81 MG tablet Take 1 tablet (81 mg total) by mouth daily. 150 tablet 2  . clopidogrel (PLAVIX) 75 MG tablet Take 75 mg by mouth daily.     . fenofibrate 160 MG tablet Take 160 mg by mouth daily.     Marland Kitchen glipiZIDE (GLUCOTROL) 5 MG tablet Take 5 mg by mouth 2 (two) times daily before a meal.     . insulin aspart (NOVOLOG) 100 UNIT/ML FlexPen Inject into the skin. Sliding scale    . insulin glargine (LANTUS) 100 UNIT/ML injection Inject into the skin. Sliding scale    . losartan (COZAAR) 100 MG tablet Take 100 mg by mouth daily.     . metFORMIN (GLUCOPHAGE) 500 MG tablet Take 500 mg by mouth 2 (two) times daily with a meal.     . mycophenolate (CELLCEPT) 500 MG tablet Take 500 mg by mouth 2 (two) times daily.     Marland Kitchen omeprazole (PRILOSEC) 20 MG capsule Take 20 mg by mouth daily.     . rosuvastatin (CRESTOR) 5 MG tablet Take 5 mg by mouth at bedtime.     . sildenafil (VIAGRA) 100 MG tablet Take by mouth.    . tamsulosin (FLOMAX) 0.4 MG CAPS capsule Take 0.4 mg by mouth daily.     . traMADol (ULTRAM) 50 MG tablet Take by mouth.    . venlafaxine (EFFEXOR) 75 MG tablet Take 75 mg by mouth 2 (two) times daily with a meal.      No current facility-administered medications on file prior to visit.     There are no Patient Instructions on file for this visit. Return in about 3 months (around 02/14/2019).   Kris Hartmann, NP  This note was completed with Sales executive.  Any  errors are purely unintentional.

## 2019-02-19 ENCOUNTER — Encounter (INDEPENDENT_AMBULATORY_CARE_PROVIDER_SITE_OTHER): Payer: Self-pay | Admitting: Vascular Surgery

## 2019-02-19 ENCOUNTER — Ambulatory Visit (INDEPENDENT_AMBULATORY_CARE_PROVIDER_SITE_OTHER): Payer: Medicare Other | Admitting: Vascular Surgery

## 2019-02-19 ENCOUNTER — Ambulatory Visit (INDEPENDENT_AMBULATORY_CARE_PROVIDER_SITE_OTHER): Payer: Medicare Other

## 2019-02-19 ENCOUNTER — Other Ambulatory Visit: Payer: Self-pay

## 2019-02-19 VITALS — BP 158/75 | HR 98 | Resp 18 | Ht 67.0 in | Wt 217.8 lb

## 2019-02-19 DIAGNOSIS — E1151 Type 2 diabetes mellitus with diabetic peripheral angiopathy without gangrene: Secondary | ICD-10-CM | POA: Diagnosis not present

## 2019-02-19 DIAGNOSIS — Z79899 Other long term (current) drug therapy: Secondary | ICD-10-CM

## 2019-02-19 DIAGNOSIS — E785 Hyperlipidemia, unspecified: Secondary | ICD-10-CM | POA: Diagnosis not present

## 2019-02-19 DIAGNOSIS — I739 Peripheral vascular disease, unspecified: Secondary | ICD-10-CM

## 2019-02-19 DIAGNOSIS — Z794 Long term (current) use of insulin: Secondary | ICD-10-CM

## 2019-02-19 DIAGNOSIS — I10 Essential (primary) hypertension: Secondary | ICD-10-CM

## 2019-02-19 DIAGNOSIS — Z87891 Personal history of nicotine dependence: Secondary | ICD-10-CM

## 2019-02-19 NOTE — Assessment & Plan Note (Signed)
ABIs today are stable at 0.53 on the right and 0.95 on the left.  Currently not having any disabling claudication symptoms with only mild claudication.  Continue current medical regimen.  We will stretch out his follow-up to 6 months at this point.  He will contact our office with any problems in the interim.

## 2019-02-19 NOTE — Assessment & Plan Note (Signed)
lipid control important in reducing the progression of atherosclerotic disease. Continue statin therapy  

## 2019-02-19 NOTE — Assessment & Plan Note (Signed)
blood glucose control important in reducing the progression of atherosclerotic disease. Also, involved in wound healing. On appropriate medications.  

## 2019-02-19 NOTE — Progress Notes (Signed)
MRN : [id number]  Brian Pacheco is a 78 y.o. (1941/06/13) male who presents with chief complaint of  Chief Complaint  Patient presents with  . Follow-up    3 month abi  .  History of Present Illness: Patient returns today in follow up of his PAD.  He is doing well today without specific complaints.  He has occasional left foot and lower leg swelling.  Mild claudication symptoms in the right leg but nothing disabling.  He underwent extensive left lower extremity revascularization about 4 months ago. ABIs today are stable at 0.53 on the right and 0.95 on the left.  Current Outpatient Medications  Medication Sig Dispense Refill  . albuterol (PROVENTIL HFA;VENTOLIN HFA) 108 (90 Base) MCG/ACT inhaler Inhale into the lungs.    Marland Kitchen amLODipine (NORVASC) 5 MG tablet Take 5 mg by mouth daily.     Marland Kitchen aspirin EC 81 MG tablet Take 1 tablet (81 mg total) by mouth daily. 150 tablet 2  . clopidogrel (PLAVIX) 75 MG tablet Take 75 mg by mouth daily.     . fenofibrate 160 MG tablet Take 160 mg by mouth daily.     Marland Kitchen glipiZIDE (GLUCOTROL) 5 MG tablet Take 5 mg by mouth 2 (two) times daily before a meal.     . insulin aspart (NOVOLOG) 100 UNIT/ML FlexPen Inject into the skin. Sliding scale    . insulin glargine (LANTUS) 100 UNIT/ML injection Inject into the skin. Sliding scale    . losartan (COZAAR) 100 MG tablet Take 100 mg by mouth daily.     . metFORMIN (GLUCOPHAGE) 500 MG tablet Take 500 mg by mouth 2 (two) times daily with a meal.     . mycophenolate (CELLCEPT) 500 MG tablet Take 500 mg by mouth 2 (two) times daily.     Marland Kitchen omeprazole (PRILOSEC) 20 MG capsule Take 20 mg by mouth daily.     . rosuvastatin (CRESTOR) 5 MG tablet Take 5 mg by mouth at bedtime.     . sildenafil (VIAGRA) 100 MG tablet Take by mouth.    . tamsulosin (FLOMAX) 0.4 MG CAPS capsule Take 0.4 mg by mouth daily.     . traMADol (ULTRAM) 50 MG tablet Take by mouth.    . venlafaxine (EFFEXOR) 75 MG tablet Take 75 mg by mouth 2 (two)  times daily with a meal.      No current facility-administered medications for this visit.     Past Medical History:  Diagnosis Date  . Asthma   . Cancer ([place])    skin cancer  . Diabetes mellitus without complication ([place])   . GERD (gastroesophageal reflux disease)   . Hyperlipidemia   . Hypertension   . 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 Peripheral vascular disease [facility])     Past Surgical History:  Procedure Laterality Date  . CAROTID ENDARTERECTOMY    . CHOLECYSTECTOMY    . HERNIA REPAIR     umbilical incarcerated  . IR ANGIOGRAM EXTREMITY BILATERAL Bilateral    patient states he has stents in both legs  . LIVER TRANSPLANT  2003  . LOWER EXTREMITY ANGIOGRAPHY Left 10/18/2018   Procedure: LOWER EXTREMITY ANGIOGRAPHY;  Surgeon: Algernon Huxley, MD;  Location: [place] [facility];  Service: Cardiovascular;  Laterality: Left;  . TONSILLECTOMY    . VASECTOMY  53    Social History Social History   Tobacco Use  . Smoking status: Former Smoker  Packs/day: 0.25    Years: 60.00    Pack years: 15.00    Last attempt to quit: 10/15/2018    Years since quitting: 0.3  . Smokeless tobacco: Never Used  . Tobacco comment: currently going to smoking cessation clinic at [facility], smoked 1 ppd until recently, down to 7 a day  Substance Use Topics  . Alcohol use: Not Currently    Comment: history of alcohol abuse quit 2003  . Drug use: Never     Family History No bleeding or clotting disorders  Allergies  Allergen Reactions  . Sitagliptin Other (See Comments)    blisters     REVIEW OF SYSTEMS (Negative unless checked)  Constitutional: [] Weight loss  [] Fever  [] Chills Cardiac: [] Chest pain   [] Chest pressure   [] Palpitations   [] Shortness of breath when laying flat   [] Shortness of breath at rest   [] Shortness of breath with exertion. Vascular:  [x] Pain in legs with walking   [] Pain in legs at rest   [] Pain in legs when laying flat    [x] Claudication   [] Pain in feet when walking  [] Pain in feet at rest  [] Pain in feet when laying flat   [] History of DVT   [] Phlebitis   [] Swelling in legs   [] Varicose veins   [] Non-healing ulcers Pulmonary:   [] Uses home oxygen   [] Productive cough   [] Hemoptysis   [] Wheeze  [] COPD   [] Asthma Neurologic:  [] Dizziness  [] Blackouts   [] Seizures   [] History of stroke   [] History of TIA  [] Aphasia   [] Temporary blindness   [] Dysphagia   [] Weakness or numbness in arms   [] Weakness or numbness in legs Musculoskeletal:  [x] Arthritis   [] Joint swelling   [] Joint pain   [] Low back pain Hematologic:  [] Easy bruising  [] Easy bleeding   [] Hypercoagulable state   [] Anemic   Gastrointestinal:  [] Blood in stool   [] Vomiting blood  [] Gastroesophageal reflux/heartburn   [] Abdominal pain Genitourinary:  [] Chronic kidney disease   [] Difficult urination  [] Frequent urination  [] Burning with urination   [] Hematuria Skin:  [] Rashes   [] Ulcers   [] Wounds Psychological:  [] History of anxiety   []  History of major depression.  Physical Examination  BP (!) 158/75 (BP Location: Right Arm)   Pulse 98   Resp 18   Ht 5\' 7"  (1.702 m)   Wt 217 lb 12.8 oz (98.8 kg)   BMI 34.11 kg/m  Gen:  WD/WN, NAD Head: [place]/AT, No temporalis wasting. Ear/Nose/Throat: Hearing grossly intact, nares w/o erythema or drainage Eyes: Conjunctiva clear. Sclera non-icteric Neck: Supple.  Trachea midline Pulmonary:  Good air movement, no use of accessory muscles.  Cardiac: RRR, no JVD Vascular:  Vessel Right Left  Radial Palpable Palpable                          PT  1+ palpable  1+ palpable  DP  1+ palpable  2+ palpable   Musculoskeletal: M/S 5/5 throughout.  No deformity or atrophy.  Trace left lower extremity edema. Neurologic: Sensation grossly intact in extremities.  Symmetrical.  Speech is fluent.  Psychiatric: Judgment intact, Mood & affect appropriate for pt's clinical situation. Dermatologic: No rashes or ulcers noted.   No cellulitis or open wounds.       Labs No results found for this or any previous visit (from the past 2160 hour(s)).  Radiology No results found.  Assessment/Plan  Essential hypertension blood pressure control important in reducing the progression  of atherosclerotic disease. On appropriate oral medications.   Diabetes ([place]) blood glucose control important in reducing the progression of atherosclerotic disease. Also, involved in wound healing. On appropriate medications.   Dyslipidemia lipid control important in reducing the progression of atherosclerotic disease. 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  Currently not having any disabling claudication symptoms with only mild claudication.  Continue current medical regimen.  We will stretch out his follow-up to 6 months at this point.  He will contact our office with any problems in the interim.    Leotis Pain, MD  02/19/2019 11:46 AM    This note was created with Dragon medical transcription system.  Any errors from dictation are purely unintentional

## 2019-02-19 NOTE — Assessment & Plan Note (Signed)
blood pressure control important in reducing the progression of atherosclerotic disease. On appropriate oral medications.  

## 2019-04-02 ENCOUNTER — Inpatient Hospital Stay
Admission: EM | Admit: 2019-04-02 | Discharge: 2019-04-04 | DRG: 871 | Disposition: A | Discharge status: Home Health | Payer: Medicare Other | Attending: Specialist | Admitting: Specialist

## 2019-04-02 ENCOUNTER — Inpatient Hospital Stay: Payer: Medicare Other

## 2019-04-02 ENCOUNTER — Emergency Department: Payer: Medicare Other

## 2019-04-02 ENCOUNTER — Other Ambulatory Visit: Payer: Self-pay

## 2019-04-02 DIAGNOSIS — Z20828 Contact with and (suspected) exposure to other viral communicable diseases: Secondary | ICD-10-CM | POA: Diagnosis present

## 2019-04-02 DIAGNOSIS — Z8249 Family history of ischemic heart disease and other diseases of the circulatory system: Secondary | ICD-10-CM

## 2019-04-02 DIAGNOSIS — Z79899 Other long term (current) drug therapy: Secondary | ICD-10-CM

## 2019-04-02 DIAGNOSIS — J189 Pneumonia, unspecified organism: Secondary | ICD-10-CM | POA: Diagnosis present

## 2019-04-02 DIAGNOSIS — Z9049 Acquired absence of other specified parts of digestive tract: Secondary | ICD-10-CM

## 2019-04-02 DIAGNOSIS — E1122 Type 2 diabetes mellitus with diabetic chronic kidney disease: Secondary | ICD-10-CM | POA: Diagnosis present

## 2019-04-02 DIAGNOSIS — Z85828 Personal history of other malignant neoplasm of skin: Secondary | ICD-10-CM | POA: Diagnosis not present

## 2019-04-02 DIAGNOSIS — J441 Chronic obstructive pulmonary disease with (acute) exacerbation: Secondary | ICD-10-CM | POA: Diagnosis present

## 2019-04-02 DIAGNOSIS — Z888 Allergy status to other drugs, medicaments and biological substances status: Secondary | ICD-10-CM

## 2019-04-02 DIAGNOSIS — Z7982 Long term (current) use of aspirin: Secondary | ICD-10-CM | POA: Diagnosis not present

## 2019-04-02 DIAGNOSIS — K219 Gastro-esophageal reflux disease without esophagitis: Secondary | ICD-10-CM | POA: Diagnosis present

## 2019-04-02 DIAGNOSIS — F17210 Nicotine dependence, cigarettes, uncomplicated: Secondary | ICD-10-CM | POA: Diagnosis present

## 2019-04-02 DIAGNOSIS — E785 Hyperlipidemia, unspecified: Secondary | ICD-10-CM | POA: Diagnosis present

## 2019-04-02 DIAGNOSIS — J440 Chronic obstructive pulmonary disease with acute lower respiratory infection: Secondary | ICD-10-CM | POA: Diagnosis present

## 2019-04-02 DIAGNOSIS — F329 Major depressive disorder, single episode, unspecified: Secondary | ICD-10-CM | POA: Diagnosis present

## 2019-04-02 DIAGNOSIS — E1151 Type 2 diabetes mellitus with diabetic peripheral angiopathy without gangrene: Secondary | ICD-10-CM

## 2019-04-02 DIAGNOSIS — I129 Hypertensive chronic kidney disease with stage 1 through stage 4 chronic kidney disease, or unspecified chronic kidney disease: Secondary | ICD-10-CM | POA: Diagnosis present

## 2019-04-02 DIAGNOSIS — J9601 Acute respiratory failure with hypoxia: Secondary | ICD-10-CM | POA: Diagnosis present

## 2019-04-02 DIAGNOSIS — Z944 Liver transplant status: Secondary | ICD-10-CM | POA: Diagnosis not present

## 2019-04-02 DIAGNOSIS — N183 Chronic kidney disease, stage 3 (moderate): Secondary | ICD-10-CM | POA: Diagnosis present

## 2019-04-02 DIAGNOSIS — Z7902 Long term (current) use of antithrombotics/antiplatelets: Secondary | ICD-10-CM | POA: Diagnosis not present

## 2019-04-02 DIAGNOSIS — Z794 Long term (current) use of insulin: Secondary | ICD-10-CM

## 2019-04-02 DIAGNOSIS — A419 Sepsis, unspecified organism: Secondary | ICD-10-CM | POA: Diagnosis present

## 2019-04-02 DIAGNOSIS — R140 Abdominal distension (gaseous): Secondary | ICD-10-CM

## 2019-04-02 LAB — URINALYSIS, COMPLETE (UACMP) WITH MICROSCOPIC
Bacteria, UA: NONE SEEN
Bilirubin Urine: NEGATIVE
Glucose, UA: NEGATIVE mg/dL
Ketones, ur: NEGATIVE mg/dL
Leukocytes,Ua: NEGATIVE
Nitrite: NEGATIVE
Protein, ur: 100 mg/dL — AB
Specific Gravity, Urine: 1.019 (ref 1.005–1.030)
pH: 5 (ref 5.0–8.0)

## 2019-04-02 LAB — CBC WITH DIFFERENTIAL/PLATELET
Abs Immature Granulocytes: 0.24 K/uL — ABNORMAL HIGH (ref 0.00–0.07)
Basophils Absolute: 0.1 K/uL (ref 0.0–0.1)
Basophils Relative: 0 %
Eosinophils Absolute: 0.1 K/uL (ref 0.0–0.5)
Eosinophils Relative: 0 %
HCT: 39.3 % (ref 39.0–52.0)
Hemoglobin: 13.5 g/dL (ref 13.0–17.0)
Immature Granulocytes: 2 %
Lymphocytes Relative: 6 %
Lymphs Abs: 1 K/uL (ref 0.7–4.0)
MCH: 31 pg (ref 26.0–34.0)
MCHC: 34.4 g/dL (ref 30.0–36.0)
MCV: 90.1 fL (ref 80.0–100.0)
Monocytes Absolute: 1.1 K/uL — ABNORMAL HIGH (ref 0.1–1.0)
Monocytes Relative: 7 %
Neutro Abs: 13.6 K/uL — ABNORMAL HIGH (ref 1.7–7.7)
Neutrophils Relative %: 85 %
Platelets: 173 K/uL (ref 150–400)
RBC: 4.36 MIL/uL (ref 4.22–5.81)
RDW: 12.4 % (ref 11.5–15.5)
WBC: 16 K/uL — ABNORMAL HIGH (ref 4.0–10.5)
nRBC: 0 % (ref 0.0–0.2)

## 2019-04-02 LAB — COMPREHENSIVE METABOLIC PANEL WITH GFR
ALT: 27 U/L (ref 0–44)
AST: 22 U/L (ref 15–41)
Albumin: 3.7 g/dL (ref 3.5–5.0)
Alkaline Phosphatase: 71 U/L (ref 38–126)
Anion gap: 12 (ref 5–15)
BUN: 21 mg/dL (ref 8–23)
CO2: 21 mmol/L — ABNORMAL LOW (ref 22–32)
Calcium: 8.7 mg/dL — ABNORMAL LOW (ref 8.9–10.3)
Chloride: 99 mmol/L (ref 98–111)
Creatinine, Ser: 1.46 mg/dL — ABNORMAL HIGH (ref 0.61–1.24)
GFR calc Af Amer: 54 mL/min — ABNORMAL LOW (ref >60)
GFR calc non Af Amer: 46 mL/min — ABNORMAL LOW (ref >60)
Glucose, Bld: 172 mg/dL — ABNORMAL HIGH (ref 70–99)
Potassium: 4.4 mmol/L (ref 3.5–5.1)
Sodium: 132 mmol/L — ABNORMAL LOW (ref 135–145)
Total Bilirubin: 1 mg/dL (ref 0.3–1.2)
Total Protein: 7.4 g/dL (ref 6.5–8.1)

## 2019-04-02 LAB — PROCALCITONIN: Procalcitonin: 0.17 ng/mL

## 2019-04-02 LAB — GLUCOSE, CAPILLARY: Glucose-Capillary: 200 mg/dL — ABNORMAL HIGH (ref 70–99)

## 2019-04-02 LAB — PROTIME-INR
INR: 1 (ref 0.8–1.2)
Prothrombin Time: 13.3 seconds (ref 11.4–15.2)

## 2019-04-02 LAB — SARS CORONAVIRUS 2 BY RT PCR (HOSPITAL ORDER, PERFORMED IN ~~LOC~~ HOSPITAL LAB): SARS Coronavirus 2: NEGATIVE

## 2019-04-02 LAB — LACTIC ACID, PLASMA: Lactic Acid, Venous: 0.9 mmol/L (ref 0.5–1.9)

## 2019-04-02 MED ORDER — TAMSULOSIN HCL 0.4 MG PO CAPS
0.4000 mg | ORAL_CAPSULE | Freq: Every day | ORAL | Status: DC
  Administered 2019-04-02 – 2019-04-04 (×3): 0.4 mg via ORAL
  Filled 2019-04-02 (×3): qty 1

## 2019-04-02 MED ORDER — LOSARTAN POTASSIUM 50 MG PO TABS: 100.0000 mg | ORAL_TABLET | Freq: Every day | ORAL | Status: DC

## 2019-04-02 MED ORDER — SODIUM CHLORIDE 0.9 % IV SOLN
2.0000 g | INTRAVENOUS | Status: DC
  Administered 2019-04-02 – 2019-04-03 (×2): 2 g via INTRAVENOUS
  Filled 2019-04-02: qty 20
  Filled 2019-04-02 (×2): qty 2
  Filled 2019-04-02: qty 20

## 2019-04-02 MED ORDER — LOSARTAN POTASSIUM 50 MG PO TABS
100.0000 mg | ORAL_TABLET | Freq: Every day | ORAL | Status: DC
  Administered 2019-04-03 – 2019-04-04 (×2): 100 mg via ORAL
  Filled 2019-04-02 (×2): qty 2

## 2019-04-02 MED ORDER — ONDANSETRON HCL 4 MG PO TABS: 4.0000 mg | ORAL_TABLET | Freq: Four times a day (QID) | ORAL | Status: DC | PRN

## 2019-04-02 MED ORDER — AMLODIPINE BESYLATE 10 MG PO TABS
10.0000 mg | ORAL_TABLET | Freq: Every day | ORAL | Status: DC
  Administered 2019-04-02 – 2019-04-04 (×3): 10 mg via ORAL
  Filled 2019-04-02 (×3): qty 1

## 2019-04-02 MED ORDER — VENLAFAXINE HCL 37.5 MG PO TABS
75.0000 mg | ORAL_TABLET | Freq: Three times a day (TID) | ORAL | Status: DC
  Administered 2019-04-02 – 2019-04-04 (×6): 75 mg via ORAL
  Filled 2019-04-02 (×8): qty 2

## 2019-04-02 MED ORDER — INSULIN GLARGINE 100 UNIT/ML ~~LOC~~ SOLN
10.0000 [IU] | Freq: Every day | SUBCUTANEOUS | Status: DC
  Administered 2019-04-02: 22:00:00 10 [IU] via SUBCUTANEOUS
  Filled 2019-04-02 (×2): qty 0.1

## 2019-04-02 MED ORDER — ACETAMINOPHEN 325 MG PO TABS
650.0000 mg | ORAL_TABLET | Freq: Four times a day (QID) | ORAL | Status: DC | PRN
  Administered 2019-04-02: 650 mg via ORAL
  Filled 2019-04-02: qty 2

## 2019-04-02 MED ORDER — FENOFIBRATE 160 MG PO TABS
160.0000 mg | ORAL_TABLET | Freq: Every day | ORAL | Status: DC
  Administered 2019-04-03 – 2019-04-04 (×2): 160 mg via ORAL
  Filled 2019-04-02 (×2): qty 1

## 2019-04-02 MED ORDER — ACETAMINOPHEN 650 MG RE SUPP: 650.0000 mg | Freq: Four times a day (QID) | RECTAL | Status: DC | PRN

## 2019-04-02 MED ORDER — ROSUVASTATIN CALCIUM 5 MG PO TABS
2.5000 mg | ORAL_TABLET | Freq: Every day | ORAL | Status: DC
  Administered 2019-04-02 – 2019-04-03 (×2): 2.5 mg via ORAL
  Filled 2019-04-02 (×3): qty 0.5

## 2019-04-02 MED ORDER — SODIUM CHLORIDE 0.9 % IV SOLN
500.0000 mg | INTRAVENOUS | Status: DC
  Administered 2019-04-02 – 2019-04-03 (×2): 500 mg via INTRAVENOUS
  Filled 2019-04-02 (×4): qty 500

## 2019-04-02 MED ORDER — TRAMADOL HCL 50 MG PO TABS
50.0000 mg | ORAL_TABLET | Freq: Two times a day (BID) | ORAL | Status: DC
  Administered 2019-04-02 – 2019-04-04 (×3): 50 mg via ORAL
  Filled 2019-04-02 (×4): qty 1

## 2019-04-02 MED ORDER — SODIUM CHLORIDE 0.9 % IV SOLN
1000.0000 mL | Freq: Once | INTRAVENOUS | Status: AC
  Administered 2019-04-02: 1000 mL via INTRAVENOUS

## 2019-04-02 MED ORDER — IPRATROPIUM-ALBUTEROL 0.5-2.5 (3) MG/3ML IN SOLN
3.0000 mL | Freq: Four times a day (QID) | RESPIRATORY_TRACT | Status: DC
  Administered 2019-04-02 – 2019-04-04 (×8): 3 mL via RESPIRATORY_TRACT
  Filled 2019-04-02 (×8): qty 3

## 2019-04-02 MED ORDER — METRONIDAZOLE IN NACL 5-0.79 MG/ML-% IV SOLN
500.0000 mg | Freq: Once | INTRAVENOUS | Status: AC
  Administered 2019-04-02: 500 mg via INTRAVENOUS
  Filled 2019-04-02: qty 100

## 2019-04-02 MED ORDER — ENOXAPARIN SODIUM 40 MG/0.4ML ~~LOC~~ SOLN
40.0000 mg | SUBCUTANEOUS | Status: DC
  Administered 2019-04-02 – 2019-04-03 (×2): 40 mg via SUBCUTANEOUS
  Filled 2019-04-02 (×2): qty 0.4

## 2019-04-02 MED ORDER — SODIUM CHLORIDE 0.9 % IV SOLN
INTRAVENOUS | Status: DC
  Administered 2019-04-02: 19:00:00 via INTRAVENOUS

## 2019-04-02 MED ORDER — ONDANSETRON HCL 4 MG/2ML IJ SOLN: 4.0000 mg | Freq: Four times a day (QID) | INTRAMUSCULAR | Status: DC | PRN

## 2019-04-02 MED ORDER — POLYETHYLENE GLYCOL 3350 17 G PO PACK: 17.0000 g | PACK | Freq: Every day | ORAL | Status: DC | PRN

## 2019-04-02 MED ORDER — VANCOMYCIN HCL IN DEXTROSE 1-5 GM/200ML-% IV SOLN
1000.0000 mg | Freq: Once | INTRAVENOUS | Status: AC
  Administered 2019-04-02: 1000 mg via INTRAVENOUS
  Filled 2019-04-02: qty 200

## 2019-04-02 MED ORDER — INSULIN ASPART 100 UNIT/ML ~~LOC~~ SOLN
0.0000 [IU] | Freq: Every day | SUBCUTANEOUS | Status: DC
  Administered 2019-04-03: 22:00:00 3 [IU] via SUBCUTANEOUS
  Filled 2019-04-02 (×2): qty 1

## 2019-04-02 MED ORDER — CLOPIDOGREL BISULFATE 75 MG PO TABS
75.0000 mg | ORAL_TABLET | Freq: Every day | ORAL | Status: DC
  Administered 2019-04-03 – 2019-04-04 (×2): 75 mg via ORAL
  Filled 2019-04-02 (×3): qty 1

## 2019-04-02 MED ORDER — PANTOPRAZOLE SODIUM 40 MG PO TBEC
40.0000 mg | DELAYED_RELEASE_TABLET | Freq: Every day | ORAL | Status: DC
  Administered 2019-04-03 – 2019-04-04 (×2): 40 mg via ORAL
  Filled 2019-04-02 (×2): qty 1

## 2019-04-02 MED ORDER — IPRATROPIUM-ALBUTEROL 0.5-2.5 (3) MG/3ML IN SOLN
RESPIRATORY_TRACT | Status: AC
  Filled 2019-04-02: qty 3

## 2019-04-02 MED ORDER — ASPIRIN EC 81 MG PO TBEC
81.0000 mg | DELAYED_RELEASE_TABLET | ORAL | Status: DC
  Administered 2019-04-02 – 2019-04-04 (×2): 81 mg via ORAL
  Filled 2019-04-02 (×3): qty 1

## 2019-04-02 MED ORDER — SODIUM CHLORIDE 0.9 % IV SOLN
2.0000 g | Freq: Once | INTRAVENOUS | Status: AC
  Administered 2019-04-02: 13:00:00 2 g via INTRAVENOUS
  Filled 2019-04-02: qty 2

## 2019-04-02 MED ORDER — INSULIN ASPART 100 UNIT/ML ~~LOC~~ SOLN
0.0000 [IU] | Freq: Three times a day (TID) | SUBCUTANEOUS | Status: DC
  Administered 2019-04-03 (×2): 11 [IU] via SUBCUTANEOUS
  Administered 2019-04-03: 8 [IU] via SUBCUTANEOUS
  Administered 2019-04-04: 3 [IU] via SUBCUTANEOUS
  Administered 2019-04-04: 13:00:00 5 [IU] via SUBCUTANEOUS
  Filled 2019-04-02 (×5): qty 1

## 2019-04-02 MED ORDER — VANCOMYCIN HCL 1.5 G IV SOLR
1500.0000 mg | Freq: Once | INTRAVENOUS | Status: AC
  Administered 2019-04-02: 15:00:00 1500 mg via INTRAVENOUS
  Filled 2019-04-02: qty 1500

## 2019-04-02 MED ORDER — BUDESONIDE 0.25 MG/2ML IN SUSP
0.2500 mg | Freq: Two times a day (BID) | RESPIRATORY_TRACT | Status: DC
  Administered 2019-04-02 – 2019-04-04 (×4): 0.25 mg via RESPIRATORY_TRACT
  Filled 2019-04-02 (×4): qty 2

## 2019-04-02 MED ORDER — MYCOPHENOLATE MOFETIL 250 MG PO CAPS
500.0000 mg | ORAL_CAPSULE | Freq: Two times a day (BID) | ORAL | Status: DC
  Administered 2019-04-02 – 2019-04-04 (×4): 500 mg via ORAL
  Filled 2019-04-02 (×5): qty 2

## 2019-04-02 MED ORDER — MYCOPHENOLATE MOFETIL 500 MG PO TABS: 500.0000 mg | ORAL_TABLET | Freq: Two times a day (BID) | ORAL | Status: DC

## 2019-04-02 MED ORDER — METHYLPREDNISOLONE SODIUM SUCC 125 MG IJ SOLR
60.0000 mg | Freq: Two times a day (BID) | INTRAMUSCULAR | Status: DC
  Administered 2019-04-02 – 2019-04-03 (×2): 60 mg via INTRAVENOUS
  Filled 2019-04-02 (×2): qty 2

## 2019-04-02 NOTE — ED Triage Notes (Signed)
flu like symptoms fever 101 this AM CBG 139 SOB, rhochi A&O, ambulatory  low 90's RA for EMS

## 2019-04-02 NOTE — Consult Note (Signed)
Pharmacy Antibiotic Note  Brian Pacheco is a 78 y.o. male admitted on 04/02/2019 with sepsis.  Pharmacy has been consulted for Vancomycin and Cefepime dosing in ED.  Plan: Patient received Vancomycin 1g IV once initially, and an additional 1,500mg  IV dose was order to both be given in the ED as the patient's loading dose, for a total of 2,500mg . Patient received 2g Cefepime IV once.  Will await admission plan for further dosing.  Height: 5\' 7"  (170.2 cm) Weight: 217 lb (98.4 kg) IBW/kg (Calculated) : 66.1  Temp (24hrs), Avg:100.9 F (38.3 C), Min:100 F (37.8 C), Max:101.7 F (38.7 C)  Recent Labs  Lab 04/02/19 1121 04/02/19 1126  WBC 16.0*  --   CREATININE 1.46*  --   LATICACIDVEN  --  0.9    Estimated Creatinine Clearance: 48.8 mL/min (A) (by C-G formula based on SCr of 1.46 mg/dL (H)).    Allergies  Allergen Reactions  . Sitagliptin Other (See Comments)    blisters    Antimicrobials this admission: Vancomycin 6/2 >>  Cefepime 6/2 >>    Microbiology results: 6/2 BCx: pending 6/2 UCx: pending    Thank you for allowing pharmacy to be a part of this patient's care.  Pearla Dubonnet, PharmD Clinical Pharmacist 04/02/2019 3:19 PM

## 2019-04-02 NOTE — Progress Notes (Signed)
Family Meeting Note  Advance Directive:yes  Today a meeting took place with the Patient.  Patient is able to participate.  The following clinical team members were present during this meeting:MD  The following were discussed:Patient's diagnosis: sepsis 2/2 cap, Patient's progosis: Unable to determine and Goals for treatment: Full Code  Additional follow-up to be provided: prn  Time spent during discussion:20 minutes  Evette Doffing, MD

## 2019-04-02 NOTE — ED Notes (Signed)
Pt took 1 tylenol at 7:30. Took 2 tylenol last night. Denies being around anyone sick but has been out to [place] recently. Is in process of being diagnosed with COPD. Has albuterol and nebs at home. Has been taking spiriva but states he doesn't like it and is trying to stop taking it.

## 2019-04-02 NOTE — H&P (Signed)
[place] [facility] [place] NAME: Brian Pacheco    MR#:  [id number]  DATE OF BIRTH:  11-16-1940  DATE OF ADMISSION:  04/02/2019  PRIMARY CARE PHYSICIAN: Derinda Late, MD   REQUESTING/REFERRING PHYSICIAN: Lavonia Drafts, MD  CHIEF COMPLAINT:   Chief Complaint  Patient presents with   Shortness of Breath    HISTORY OF PRESENT ILLNESS:  Brian Pacheco  is a 78 y.o. male with a known history of hypertension, hyperlipidemia, type 2 diabetes, peripheral vascular disease, liver cirrhosis s/p liver transplant in 2004 who presented to the ED with shortness of breath for the last 4 days.  He states that he was started on Spiriva about 1.5 weeks ago.  Since starting the Spiriva, he has "felt really bad".  He has been fatigued and had a headache.  He called his PCP, and was advised to stop the Spiriva.  He has been off the Spiriva for about 4 days, and noted that his shortness of breath got significantly worse after stopping this medicine.  He also developed cough productive of green sputum.  He endorses fever to 101F at home.  He also endorses chills.  In the ED, he was meeting sepsis criteria with tachycardia to 117, tachypnea to 30, leukocytosis to 16.  He required 2 L O2 to maintain his O2 sats greater than 90%.  Lactic acid was 1.9.  Chest x-ray was negative. He was started on empiric vancomycin, cefepime, and Flagyl.  Hospitalists were called for admission.  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[place])    liver replacement per pt report   Peripheral vascular disease ([place])     PAST SURGICAL HISTORY:   Past Surgical History:  Procedure Laterality Date   CAROTID ENDARTERECTOMY     CHOLECYSTECTOMY     HERNIA REPAIR       umbilical incarcerated   IR ANGIOGRAM EXTREMITY BILATERAL Bilateral    patient states he has stents in both legs   LIVER TRANSPLANT  2003   LOWER EXTREMITY ANGIOGRAPHY Left 10/18/2018   Procedure: LOWER EXTREMITY ANGIOGRAPHY;  Surgeon: Algernon Huxley, MD;  Location: [facility];  Service: Cardiovascular;  Laterality: Left;   TONSILLECTOMY     VASECTOMY  1977    SOCIAL HISTORY:   Social History   Tobacco Use   Smoking status: Former Smoker    Packs/day: 0.25    Years: 60.00    Pack years: 15.00    Last attempt to quit: 10/15/2018    Years since quitting: 0.4   Smokeless tobacco: Never Used   Tobacco comment: currently going to smoking cessation clinic at [facility], smoked 1 ppd until recently, down to 7 a day  Substance Use Topics   Alcohol use: Not Currently    Comment: history of alcohol abuse quit 2003    FAMILY HISTORY:  Father-heart disease DRUG ALLERGIES:   Allergies  Allergen Reactions   Sitagliptin Other (See Comments)    blisters    REVIEW OF SYSTEMS:   Review of Systems  Constitutional: Positive for chills, fever and malaise/fatigue.  HENT: Negative for congestion and sore throat.   Eyes: Negative for blurred vision and double vision.  Respiratory: Positive  for cough, sputum production, shortness of breath and wheezing.   Cardiovascular: Negative for chest pain.  Gastrointestinal: Negative for nausea and vomiting.  Genitourinary: Negative for dysuria and urgency.  Musculoskeletal: Negative for back pain and neck pain.  Neurological: Negative for dizziness and headaches.  Psychiatric/Behavioral: Negative for depression. The patient is not nervous/anxious.     MEDICATIONS AT HOME:   Prior to Admission medications   Medication Sig Start Date End Date Taking? Authorizing Provider  albuterol (PROVENTIL HFA;VENTOLIN HFA) 108 (90 Base) MCG/ACT inhaler Inhale into the lungs.    [provider]  amLODipine (NORVASC) 5 MG tablet Take 5  mg by mouth daily.     [provider]  aspirin EC 81 MG tablet Take 1 tablet (81 mg total) by mouth daily. 10/18/18   Algernon Huxley, MD  clopidogrel (PLAVIX) 75 MG tablet Take 75 mg by mouth daily.     [provider]  fenofibrate 160 MG tablet Take 160 mg by mouth daily.  03/07/18   [provider]  glipiZIDE (GLUCOTROL) 5 MG tablet Take 5 mg by mouth 2 (two) times daily before a meal.  03/24/10   [provider]  insulin aspart (NOVOLOG) 100 UNIT/ML FlexPen Inject into the skin. Sliding scale    [provider]  insulin glargine (LANTUS) 100 UNIT/ML injection Inject into the skin. Sliding scale    [provider]  losartan (COZAAR) 100 MG tablet Take 100 mg by mouth daily.     [provider]  metFORMIN (GLUCOPHAGE) 500 MG tablet Take 500 mg by mouth 2 (two) times daily with a meal.  03/24/10   [provider]  mycophenolate (CELLCEPT) 500 MG tablet Take 500 mg by mouth 2 (two) times daily.     [provider]  omeprazole (PRILOSEC) 20 MG capsule Take 20 mg by mouth daily.  08/13/10   [provider]  rosuvastatin (CRESTOR) 5 MG tablet Take 5 mg by mouth at bedtime.  09/18/18 09/18/19  [provider]  sildenafil (VIAGRA) 100 MG tablet Take by mouth.    [provider]  tamsulosin (FLOMAX) 0.4 MG CAPS capsule Take 0.4 mg by mouth daily.     [provider]  traMADol (ULTRAM) 50 MG tablet Take by mouth.    [provider]  venlafaxine (EFFEXOR) 75 MG tablet Take 75 mg by mouth 2 (two) times daily with a meal.     [provider]      VITAL SIGNS:  Blood pressure (!) 166/98, pulse (!) 116, temperature 100 F (37.8 C), temperature source Oral, resp. rate (!) 27, height 5\' 7"  (1.702 m), weight 98.4 kg, SpO2 94 %.  PHYSICAL EXAMINATION:  Physical Exam  GENERAL:  78 y.o.-year-old patient lying in the bed with no acute distress.  EYES: Pupils equal, round, reactive  to light and accommodation. No scleral icterus. Extraocular muscles intact.  HEENT: Head atraumatic, normocephalic. Oropharynx and nasopharynx clear.  NECK:  Supple, no jugular venous distention. No thyroid enlargement, no tenderness.  LUNGS:+ Diffuse inspiratory and expiratory wheezing present.  Able to speak in full sentences.  No use of accessory muscles of respiration.  CARDIOVASCULAR: RRR, S1, S2 normal. No murmurs, rubs, or gallops.  ABDOMEN: Soft, nontender. Bowel sounds present. No organomegaly or mass. + Mild abdominal distention present. + Well-healed surgical incision across the upper abdomen. EXTREMITIES: No pedal edema, cyanosis, or clubbing.  NEUROLOGIC: Cranial nerves II through XII are intact. Muscle strength 5/5 in all extremities. Sensation  intact. Gait not checked.  PSYCHIATRIC: The patient is alert and oriented x 3.  SKIN: No obvious rash, lesion, or ulcer.   LABORATORY PANEL:   CBC Recent Labs  Lab 04/02/19 1121  WBC 16.0*  HGB 13.5  HCT 39.3  PLT 173   ------------------------------------------------------------------------------------------------------------------  Chemistries  Recent Labs  Lab 04/02/19 1121  NA 132*  K 4.4  CL 99  CO2 21*  GLUCOSE 172*  BUN 21  CREATININE 1.46*  CALCIUM 8.7*  AST 22  ALT 27  ALKPHOS 71  BILITOT 1.0   ------------------------------------------------------------------------------------------------------------------  Cardiac Enzymes No results for input(s): TROPONINI in the last 168 hours. ------------------------------------------------------------------------------------------------------------------  RADIOLOGY:  Dg Chest Port 1 View  Result Date: 04/02/2019 CLINICAL DATA:  Fever and shortness of breath today. EXAM: PORTABLE CHEST 1 VIEW COMPARISON:  None. FINDINGS: The lungs are clear. Heart size is normal. No pneumothorax or pleural fluid. No bony abnormality. IMPRESSION: Negative chest. Electronically Signed    By: Inge Rise M.D.   On: 04/02/2019 12:05      IMPRESSION AND PLAN:   Sepsis-likely secondary to community-acquired pneumonia based on clinical presentation, although chest x-ray is negative.  No other sources of infection identified.  Meeting sepsis criteria on admission with tachycardia, tachypnea, and leukocytosis.  Also with fever at home. -COVID negative -Given vancomycin, cefepime, and Flagyl in the ED.  Will change to ceftriaxone and azithromycin. -IV Solumedrol given patient's diffuse wheezing -Follow-up blood and urine cultures -Repeat two-view chest x-ray in the morning -Check procalcitonin  Acute hypoxic respiratory failure- due to community-acquired pneumonia and COPD exacerbation -Antibiotics and steroids as above -DuoNebs PRN -Pulmicort nebs twice daily -Wean O2 as able  History of liver cirrhosis-S/P liver transplant in 2004 -Continue home CellCept  Hypertension- blood pressures mildly elevated in the ED -Continue home Norvasc and losartan  Type 2 diabetes- blood sugars elevated in the ED -Lantus and SSI  CKD 3- creatinine at baseline. -Avoid nephrotoxic agents -Monitor  Hyperlipidemia-stable -Continue home Crestor and fenofibrate  Depression-stable -Continue home Effexor  All the records are reviewed and case discussed with ED provider. Management plans discussed with the patient, family and they are in agreement.  CODE STATUS: FULL  TOTAL TIME TAKING CARE OF THIS PATIENT: 45 minutes.    Berna Spare Kahil Agner M.D on 04/02/2019 at 1:55 PM  Between 7am to 6pm - Pager - [phone]  After 6pm go to [email or web address] - Proofreader  [facility]  Hospitalists  Office  [phone]  CC: Primary care physician; Derinda Late, MD   Note: This dictation was prepared with Dragon dictation along with smaller phrase technology. Any transcriptional errors that result from this process are unintentional.

## 2019-04-02 NOTE — ED Notes (Signed)
Dr. Mayo at bedside 

## 2019-04-02 NOTE — ED Notes (Signed)
X-ray at bedside

## 2019-04-02 NOTE — ED Notes (Signed)
ED TO INPATIENT HANDOFF REPORT  ED Nurse Name and Phone #:  Anda Kraft [phone]  S Name/Age/Gender Brian Pacheco 78 y.o. male Room/Bed: ED35A/ED35A  Code Status   Code Status: Prior  Home/SNF/Other Home Patient oriented to: self, place, time and situation Is this baseline? Yes   Triage Complete: Triage complete  Chief Complaint fever ems  Triage Note flu like symptoms fever 101 this AM CBG 139 SOB, rhochi A&O, ambulatory  low 90's RA for EMS   Allergies Allergies  Allergen Reactions  . Sitagliptin Other (See Comments)    blisters    Level of Care/Admitting Diagnosis ED Disposition    ED Disposition Condition [facility] Area: [place] [[id number]]  Level of Care: Med-Surg [16]  Covid Evaluation: Confirmed COVID Negative  Diagnosis: Sepsis [facility]) [[id number]]  Admitting Physician: Hyman Bible DODD [[id number]]  Attending Physician: Hyman Bible DODD [[id number]]  Estimated length of stay: past midnight tomorrow  Certification:: I certify this patient will need inpatient services for at least 2 midnights  PT Class (Do Not Modify): Inpatient [101]  PT Acc Code (Do Not Modify): Private [1]       B Medical/Surgery History Past Medical History:  Diagnosis Date  . Asthma   . Cancer ([place])    skin cancer  . Diabetes mellitus without complication ([place])   . GERD (gastroesophageal reflux disease)   . Hyperlipidemia   . Hypertension   . 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 Peripheral vascular disease [facility])    Past Surgical History:  Procedure Laterality Date  . CAROTID ENDARTERECTOMY    . CHOLECYSTECTOMY    . HERNIA REPAIR     umbilical incarcerated  . IR ANGIOGRAM EXTREMITY BILATERAL Bilateral    patient states he has stents in both legs  . LIVER TRANSPLANT  2003  . LOWER EXTREMITY ANGIOGRAPHY Left 10/18/2018   Procedure: LOWER EXTREMITY ANGIOGRAPHY;  Surgeon: Algernon Huxley, MD;  Location: [place]  CV LAB;  Service: Cardiovascular;  Laterality: Left;  . TONSILLECTOMY    . VASECTOMY  1977     A IV Location/Drains/Wounds Patient Lines/Drains/Airways Status   Active Line/Drains/Airways    Name:   Placement date:   Placement time:   Site:   Days:   Peripheral IV 04/02/19 Right Forearm   04/02/19    1150    Forearm   less than 1   Peripheral IV 04/02/19 Left Forearm   04/02/19    1150    Forearm   less than 1          Intake/Output Last 24 hours  Intake/Output Summary (Last 24 hours) at 04/02/2019 1645 Last data filed at 04/02/2019 1525 Gross per 24 hour  Intake 1287.11 ml  Output 200 ml  Net 1087.11 ml    Labs/Imaging Results for orders placed or performed during the hospital encounter of 04/02/19 (from the past 48 hour(s))  Comprehensive metabolic panel     Status: Abnormal   Collection Time: 04/02/19 11:21 AM  Result Value Ref Range   Sodium 132 (L) 135 - 145 mmol/L   Potassium 4.4 3.5 - 5.1 mmol/L   Chloride 99 98 - 111 mmol/L   CO2 21 (L) 22 - 32 mmol/L   Glucose, Bld 172 (H) 70 - 99 mg/dL   BUN 21 8 - 23 mg/dL   Creatinine, Ser 1.46 (H) 0.61 - 1.24 mg/dL   Calcium 8.7 (L) 8.9 -  10.3 mg/dL   Total Protein 7.4 6.5 - 8.1 g/dL   Albumin 3.7 3.5 - 5.0 g/dL   AST 22 15 - 41 U/L   ALT 27 0 - 44 U/L   Alkaline Phosphatase 71 38 - 126 U/L   Total Bilirubin 1.0 0.3 - 1.2 mg/dL   GFR calc non Af Amer 46 (L) >60 mL/min   GFR calc Af Amer 54 (L) >60 mL/min   Anion gap 12 5 - 15    Comment: Performed at [facility], [address] [place], [place] [address]  CBC with Differential     Status: Abnormal   Collection Time: 04/02/19 11:21 AM  Result Value Ref Range   WBC 16.0 (H) 4.0 - 10.5 K/uL   RBC 4.36 4.22 - 5.81 MIL/uL   Hemoglobin 13.5 13.0 - 17.0 g/dL   HCT 39.3 39.0 - 52.0 %   MCV 90.1 80.0 - 100.0 fL   MCH 31.0 26.0 - 34.0 pg   MCHC 34.4 30.0 - 36.0 g/dL   RDW 12.4 11.5 - 15.5 %   Platelets 173 150 - 400 K/uL   nRBC 0.0 0.0 - 0.2 %   Neutrophils  Relative % 85 %   Neutro Abs 13.6 (H) 1.7 - 7.7 K/uL   Lymphocytes Relative 6 %   Lymphs Abs 1.0 0.7 - 4.0 K/uL   Monocytes Relative 7 %   Monocytes Absolute 1.1 (H) 0.1 - 1.0 K/uL   Eosinophils Relative 0 %   Eosinophils Absolute 0.1 0.0 - 0.5 K/uL   Basophils Relative 0 %   Basophils Absolute 0.1 0.0 - 0.1 K/uL   Immature Granulocytes 2 %   Abs Immature Granulocytes 0.24 (H) 0.00 - 0.07 K/uL    Comment: Performed at [facility], [address] [place], [place] [address]  Protime-INR     Status: None   Collection Time: 04/02/19 11:21 AM  Result Value Ref Range   Prothrombin Time 13.3 11.4 - 15.2 seconds   INR 1.0 0.8 - 1.2    Comment: (NOTE) INR goal varies based on device and disease states. Performed at [facility], [address] [place], [place] [address]   Lactic acid, plasma     Status: None   Collection Time: 04/02/19 11:26 AM  Result Value Ref Range   Lactic Acid, Venous 0.9 0.5 - 1.9 mmol/L    Comment: Performed at [facility], [address], [place], [place] [address]  SARS Coronavirus 2 (CEPHEID- Performed in [place] hospital lab), Hosp Order     Status: None   Collection Time: 04/02/19 11:26 AM  Result Value Ref Range   SARS Coronavirus 2 NEGATIVE NEGATIVE    Comment: (NOTE) If result is NEGATIVE SARS-CoV-2 target nucleic acids are NOT DETECTED. The SARS-CoV-2 RNA is generally detectable in upper and lower  respiratory specimens during the acute phase of infection. The lowest  concentration of SARS-CoV-2 viral copies this assay can detect is 250  copies / mL. A negative result does not preclude SARS-CoV-2 infection  and should not be used as the sole basis for treatment or other  patient management decisions.  A negative result may occur with  improper specimen collection / handling, submission of specimen other  than nasopharyngeal swab, presence of viral mutation(s) within the  areas targeted by this assay, and  inadequate number of viral copies  (<250 copies / mL). A negative result must be combined with clinical  observations, patient history, and epidemiological  information. If result is POSITIVE SARS-CoV-2 target nucleic acids are DETECTED. The SARS-CoV-2 RNA is generally detectable in upper and lower  respiratory specimens dur ing the acute phase of infection.  Positive  results are indicative of active infection with SARS-CoV-2.  Clinical  correlation with patient history and other diagnostic information is  necessary to determine patient infection status.  Positive results do  not rule out bacterial infection or co-infection with other viruses. If result is PRESUMPTIVE POSTIVE SARS-CoV-2 nucleic acids MAY BE PRESENT.   A presumptive positive result was obtained on the submitted specimen  and confirmed on repeat testing.  While 2019 novel coronavirus  (SARS-CoV-2) nucleic acids may be present in the submitted sample  additional confirmatory testing may be necessary for epidemiological  and / or clinical management purposes  to differentiate between  SARS-CoV-2 and other Sarbecovirus currently known to infect humans.  If clinically indicated additional testing with an alternate test  methodology [phone]) is advised. The SARS-CoV-2 RNA is generally  detectable in upper and lower respiratory sp ecimens during the acute  phase of infection. The expected result is Negative. Fact Sheet for Patients:  StrictlyIdeas.no Fact Sheet for Healthcare Providers: BankingDealers.co.za This test is not yet approved or cleared by the [place] FDA and has been authorized for detection and/or diagnosis of SARS-CoV-2 by FDA under an Emergency Use Authorization (EUA).  This EUA will remain in effect (meaning this test can be used) for the duration of the COVID-19 declaration under Section 564(b)(1) of the Act, 21 U.S.C. section 360bbb-3(b)(1), unless the  authorization is terminated or revoked sooner. Performed at [facility], [address] [place], [place] [address]   Urinalysis, Complete w Microscopic     Status: Abnormal   Collection Time: 04/02/19  2:20 PM  Result Value Ref Range   Color, Urine YELLOW (A) YELLOW   APPearance CLEAR (A) CLEAR   Specific Gravity, Urine 1.019 1.005 - 1.030   pH 5.0 5.0 - 8.0   Glucose, UA NEGATIVE NEGATIVE mg/dL   Hgb urine dipstick SMALL (A) NEGATIVE   Bilirubin Urine NEGATIVE NEGATIVE   Ketones, ur NEGATIVE NEGATIVE mg/dL   Protein, ur 100 (A) NEGATIVE mg/dL   Nitrite NEGATIVE NEGATIVE   Leukocytes,Ua NEGATIVE NEGATIVE   RBC / HPF 0-5 0 - 5 RBC/hpf   WBC, UA 0-5 0 - 5 WBC/hpf   Bacteria, UA NONE SEEN NONE SEEN   Squamous Epithelial / LPF 0-5 0 - 5    Comment: Performed at [facility], [address] [place],  [address]   Dg Chest Port 1 View  Result Date: 04/02/2019 CLINICAL DATA:  Fever and shortness of breath today. EXAM: PORTABLE CHEST 1 VIEW COMPARISON:  None. FINDINGS: The lungs are clear. Heart size is normal. No pneumothorax or pleural fluid. No bony abnormality. IMPRESSION: Negative chest. Electronically Signed   By: Inge Rise M.D.   On: 04/02/2019 12:05    Pending Labs Unresulted Labs (From admission, onward)    Start     Ordered   04/02/19 1226  Urine culture  ONCE - STAT,   STAT     04/02/19 1225   43/32/95 1884  Mycophenolic Acid (CellCept)  Once,   STAT     04/02/19 1125   04/02/19 1121  Culture, blood (Routine x 2)  BLOOD CULTURE X 2,   STAT     04/02/19 1120   Signed and Held  Procalcitonin  ONCE - STAT,   R  Signed and Held   Signed and Held  Basic metabolic panel  Tomorrow morning,   R     Signed and Held   Signed and Held  CBC  Tomorrow morning,   R     Signed and Held   Signed and Held  Respiratory Panel by PCR  (Respiratory virus panel with precautions)  Once,   R     Signed and Held          Vitals/Pain Today's Vitals    04/02/19 1359 04/02/19 1419 04/02/19 1527 04/02/19 1548  BP: (!) 157/106     Pulse: (!) 117 (!) 112  (!) 128  Resp: (!) 29 (!) 30  (!) 27  Temp:   (!) 103.1 F (39.5 C)   TempSrc:   Oral   SpO2: 95% 95%  94%  Weight:      Height:      PainSc:        Isolation Precautions Droplet and Contact precautions  Medications Medications  vancomycin (VANCOCIN) 1,500 mg in sodium chloride 0.9 % 500 mL IVPB (1,500 mg Intravenous New Bag/Given 04/02/19 1526)  acetaminophen (TYLENOL) tablet 650 mg (650 mg Oral Given 04/02/19 1534)    Or  acetaminophen (TYLENOL) suppository 650 mg ( Rectal See Alternative 04/02/19 1534)  ipratropium-albuterol (DUONEB) 0.5-2.5 (3) MG/3ML nebulizer solution 3 mL (3 mLs Nebulization Given 04/02/19 1537)  ceFEPIme (MAXIPIME) 2 g in sodium chloride 0.9 % 100 mL IVPB ( Intravenous Stopped 04/02/19 1314)  metroNIDAZOLE (FLAGYL) IVPB 500 mg (0 mg Intravenous Stopped 04/02/19 1427)  vancomycin (VANCOCIN) IVPB 1000 mg/200 mL premix ( Intravenous Stopped 04/02/19 1346)  0.9 %  sodium chloride infusion (0 mLs Intravenous Stopped 04/02/19 1525)    Mobility walks Low fall risk   Focused Assessments    R Recommendations: See Admitting Provider Note  Report given to:   Additional Notes:

## 2019-04-02 NOTE — ED Provider Notes (Signed)
[facility] Emergency Department Provider Note   ____________________________________________    I have reviewed the triage vital signs and the nursing notes.   HISTORY  Chief Complaint Shortness of Breath     HPI Brian Pacheco is a 78 y.o. male with a history of liver transplant, COPD, diabetes, hypertension, peripheral arterial disease who presents today with complaints of worsening shortness of breath and chills.  Patient reports breathing has worsened in the last several days despite compliance with his COPD medications.  Last night he had severe chills which made him concerned and so he presented to the emergency department.  He has not taken anything for this besides his COPD medications.  Has been trying to avoid other people, has not knowingly been exposed to COVID-19  Past Medical History:  Diagnosis Date  . Asthma   . Cancer ([place])    skin cancer  . Diabetes mellitus without complication ([place])   . GERD (gastroesophageal reflux disease)   . Hyperlipidemia   . Hypertension   . 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 Peripheral vascular disease [facility])     Patient Active Problem List   Diagnosis Date Noted  . Sepsis ([place]) 04/02/2019  . Peripheral vascular disease of extremity with claudication ([place]) 10/10/2018  . Peripheral arterial disease ([place]) 08/20/2015  . Dyslipidemia 08/12/2011  . Essential hypertension 08/12/2011  . Diabetes ([place]) 07/18/2011    Past Surgical History:  Procedure Laterality Date  . CAROTID ENDARTERECTOMY    . CHOLECYSTECTOMY    . HERNIA REPAIR     umbilical incarcerated  . IR ANGIOGRAM EXTREMITY BILATERAL Bilateral    patient states he has stents in both legs  . LIVER TRANSPLANT  2003  . LOWER EXTREMITY ANGIOGRAPHY Left 10/18/2018   Procedure: LOWER EXTREMITY ANGIOGRAPHY;  Surgeon: Algernon Huxley, MD;  Location: [place] CV LAB;  Service: Cardiovascular;  Laterality: Left;   . TONSILLECTOMY    . VASECTOMY  1977    Prior to Admission medications   Medication Sig Start Date End Date Taking? Authorizing Provider  albuterol (PROVENTIL HFA;VENTOLIN HFA) 108 (90 Base) MCG/ACT inhaler Inhale 2 puffs into the lungs every 4 (four) hours as needed.    Yes [provider]  amLODipine (NORVASC) 10 MG tablet Take 10 mg by mouth daily.    Yes [provider]  aspirin EC 81 MG tablet Take 1 tablet (81 mg total) by mouth daily. Patient taking differently: Take 81 mg by mouth every other day.  10/18/18  Yes Dew, Erskine Squibb, MD  clopidogrel (PLAVIX) 75 MG tablet Take 75 mg by mouth daily.    Yes [provider]  fenofibrate 160 MG tablet Take 160 mg by mouth daily.  03/07/18  Yes [provider]  glipiZIDE (GLUCOTROL) 5 MG tablet Take 5 mg by mouth 2 (two) times daily before a meal.  03/24/10  Yes [provider]  insulin aspart (NOVOLOG) 100 UNIT/ML FlexPen Inject into the skin. Sliding scale   Yes [provider]  insulin glargine (LANTUS) 100 UNIT/ML injection Inject into the skin. Sliding scale   Yes [provider]  losartan (COZAAR) 100 MG tablet Take 100 mg by mouth daily.    Yes [provider]  metFORMIN (GLUCOPHAGE) 500 MG tablet Take 500 mg by mouth 2 (two) times daily with a meal.  03/24/10  Yes [provider]  mycophenolate (CELLCEPT) 500 MG tablet Take 500 mg  by mouth 2 (two) times daily.    Yes [provider]  omeprazole (PRILOSEC) 20 MG capsule Take 20 mg by mouth daily.  08/13/10  Yes [provider]  rosuvastatin (CRESTOR) 5 MG tablet Take 2.5 mg by mouth at bedtime.  09/18/18 09/18/19 Yes [provider]  sildenafil (VIAGRA) 100 MG tablet Take by mouth.   Yes [provider]  tamsulosin (FLOMAX) 0.4 MG CAPS capsule Take 0.4 mg by mouth daily.    Yes [provider]  traMADol (ULTRAM) 50 MG tablet Take 50 mg by mouth 2 (two) times daily.    Yes  [provider]  venlafaxine (EFFEXOR) 75 MG tablet Take 75 mg by mouth 3 (three) times daily.    Yes [provider]  budesonide-formoterol (SYMBICORT) 80-4.5 MCG/ACT inhaler Inhale 2 puffs into the lungs 2 (two) times a day. 04/04/19   Henreitta Leber, MD  predniSONE (DELTASONE) 10 MG tablet Label  & dispense according to the schedule below. 5 Pills PO for 1 day then, 4 Pills PO for 1 day, 3 Pills PO for 1 day, 2 Pills PO for 1 day, 1 Pill PO for 1 days then STOP. 04/04/19   Sainani, Belia Heman, MD     Allergies Sitagliptin  History reviewed. No pertinent family history.  Social History Social History   Tobacco Use  . Smoking status: Former Smoker    Packs/day: 0.25    Years: 60.00    Pack years: 15.00    Last attempt to quit: 10/15/2018    Years since quitting: 0.4  . Smokeless tobacco: Never Used  . Tobacco comment: currently going [facility] smoking cessation clinic at [facility], smoked 1 ppd until recently, down to 7 a day  Substance Use Topics  . Alcohol use: Not Currently    Comment: history of alcohol abuse quit 2003  . Drug use: Never    Review of Systems  Constitutional: As above Eyes: No visual changes.  ENT: No sore throat. Cardiovascular: Denies chest pain. Respiratory: As above Gastrointestinal: No abdominal pain.  No nausea, no vomiting.   Genitourinary: Negative for dysuria. Musculoskeletal: Negative for back pain. Skin: Negative for rash. Neurological: Negative for headaches or weakness   ____________________________________________   PHYSICAL EXAM:  VITAL SIGNS: ED Triage Vitals  Enc Vitals Group     BP 04/02/19 1119 (!) 177/97     Pulse Rate 04/02/19 1119 (!) 120     Resp 04/02/19 1119 (!) 24     Temp 04/02/19 1119 (!) 101.7 F (38.7 C)     Temp Source 04/02/19 1119 Oral     SpO2 04/02/19 1119 94 %     Weight 04/02/19 1121 98.4 kg (217 lb)     Height 04/02/19 1121 1.702 m (5\' 7" )     Head Circumference --      Peak Flow --      Pain  Score 04/02/19 1119 0     Pain Loc --      Pain Edu? --      Excl. in [place]? --     Constitutional: Alert and oriented.  Eyes: Conjunctivae are normal.   Nose: No congestion/rhinnorhea. Mouth/Throat: Mucous membranes are moist.    Cardiovascular: Tachycardia regular rhythm. Grossly normal heart sounds.  Good peripheral circulation. Respiratory: Increased respiratory effort with tachypnea.  No retractions.  Scattered wheezes, bibasilar [place] Gastrointestinal: Soft and nontender. No distention.    Musculoskeletal: No lower extremity tenderness nor edema.  Warm and well perfused Neurologic:  Normal speech and language. No gross focal neurologic deficits are appreciated.  Skin:  Skin is warm, dry and intact. No rash noted. Psychiatric: Mood and affect are normal. Speech and behavior are normal.  ____________________________________________   LABS (all labs ordered are listed, but only abnormal results are displayed)  Labs Reviewed  COMPREHENSIVE METABOLIC PANEL - Abnormal; Notable for the following components:      Result Value   Sodium 132 (*)    CO2 21 (*)    Glucose, Bld 172 (*)    Creatinine, Ser 1.46 (*)    Calcium 8.7 (*)    GFR calc non Af Amer 46 (*)    GFR calc Af Amer 54 (*)    All other components within normal limits  CBC WITH DIFFERENTIAL/PLATELET - Abnormal; Notable for the following components:   WBC 16.0 (*)    Neutro Abs 13.6 (*)    Monocytes Absolute 1.1 (*)    Abs Immature Granulocytes 0.24 (*)    All other components within normal limits  URINALYSIS, COMPLETE (UACMP) WITH MICROSCOPIC - Abnormal; Notable for the following components:   Color, Urine YELLOW (*)    APPearance CLEAR (*)    Hgb urine dipstick SMALL (*)    Protein, ur 100 (*)    All other components within normal limits  MYCOPHENOLIC ACID (CELLCEPT) - Abnormal; Notable for the following components:   MPA 3.6 (*)    All other components within normal limits  BASIC METABOLIC PANEL - Abnormal;  Notable for the following components:   Sodium 130 (*)    CO2 18 (*)    Glucose, Bld 322 (*)    BUN 30 (*)    Creatinine, Ser 1.77 (*)    Calcium 7.5 (*)    GFR calc non Af Amer 37 (*)    GFR calc Af Amer 43 (*)    All other components within normal limits  CBC - Abnormal; Notable for the following components:   WBC 13.8 (*)    RBC 3.92 (*)    Hemoglobin 12.0 (*)    HCT 35.8 (*)    Platelets 149 (*)    All other components within normal limits  GLUCOSE, CAPILLARY - Abnormal; Notable for the following components:   Glucose-Capillary 200 (*)    All other components within normal limits  GLUCOSE, CAPILLARY - Abnormal; Notable for the following components:   Glucose-Capillary 313 (*)    All other components within normal limits  GLUCOSE, CAPILLARY - Abnormal; Notable for the following components:   Glucose-Capillary 309 (*)    All other components within normal limits  HEMOGLOBIN A1C - Abnormal; Notable for the following components:   Hgb A1c MFr Bld 6.9 (*)    All other components within normal limits  GLUCOSE, CAPILLARY - Abnormal; Notable for the following components:   Glucose-Capillary 260 (*)    All other components within normal limits  CBC - Abnormal; Notable for the following components:   WBC 16.7 (*)    RBC 3.67 (*)    Hemoglobin 11.4 (*)    HCT 33.6 (*)    All other components within normal limits  BASIC METABOLIC PANEL - Abnormal; Notable for the following components:   CO2 21 (*)    Glucose, Bld 264 (*)    BUN 42 (*)    Creatinine, Ser 1.88 (*)    Calcium 8.0 (*)    GFR calc non Af Amer 34 (*)    GFR calc Af Amer 40 (*)  All other components within normal limits  GLUCOSE, CAPILLARY - Abnormal; Notable for the following components:   Glucose-Capillary 350 (*)    All other components within normal limits  GLUCOSE, CAPILLARY - Abnormal; Notable for the following components:   Glucose-Capillary 275 (*)    All other components within normal limits  GLUCOSE,  CAPILLARY - Abnormal; Notable for the following components:   Glucose-Capillary 184 (*)    All other components within normal limits  GLUCOSE, CAPILLARY - Abnormal; Notable for the following components:   Glucose-Capillary 250 (*)    All other components within normal limits  GLUCOSE, CAPILLARY - Abnormal; Notable for the following components:   Glucose-Capillary 226 (*)    All other components within normal limits  CULTURE, BLOOD (ROUTINE X 2)  CULTURE, BLOOD (ROUTINE X 2)  SARS CORONAVIRUS 2 (HOSPITAL ORDER, [place] LAB)  URINE CULTURE  RESPIRATORY PANEL BY PCR  MRSA PCR SCREENING  LACTIC ACID, PLASMA  PROTIME-INR  PROCALCITONIN   ____________________________________________  EKG  ED ECG REPORT I, Lavonia Drafts, the attending physician, personally viewed and interpreted this ECG.  Date: 04/02/2019  Rhythm: Sinus tachycardia QRS Axis: normal Intervals: normal ST/T Wave abnormalities: normal Narrative Interpretation: no evidence of acute ischemia  ____________________________________________  RADIOLOGY  Chest x-ray unremarkable ____________________________________________   PROCEDURES  Procedure(s) performed: No  Procedures   Critical Care performed: yes  CRITICAL CARE Performed by: Lavonia Drafts   Total critical care time: 30 minutes  Critical care time was exclusive of separately billable procedures and treating other patients.  Critical care was necessary to treat or prevent imminent or life-threatening deterioration.  Critical care was time spent personally by me on the following activities: development of treatment plan with patient and/or surrogate as well as nursing, discussions with consultants, evaluation of patient's response to treatment, examination of patient, obtaining history from patient or surrogate, ordering and performing treatments and interventions, ordering and review of laboratory studies, ordering and  review of radiographic studies, pulse oximetry and re-evaluation of patient's condition.  ____________________________________________   INITIAL IMPRESSION / ASSESSMENT AND PLAN / ED COURSE  Pertinent labs & imaging results that were available during my care of the patient were reviewed by me and considered in my medical decision making (see chart for details).  Patient presents with shortness of breath, chills.  Vitals are remarkable for tachycardia, fever, tachypnea, patient is requiring oxygen here.  Presentation is concerning for pneumonia/sepsis versus COVID-19.  We will obtain chest x-ray, check labs including lactic acid, blood cultures, send COVID swab given nasal cannula oxygen   Work significant for elevated white blood cell count, given this fever and tachycardia will cover broadly with IV antibiotics, chest x-ray is unremarkable    ____________________________________________   FINAL CLINICAL IMPRESSION(S) / ED DIAGNOSES  Final diagnoses:  Sepsis without acute organ dysfunction, due to unspecified organism [facility])        Note:  This document was prepared using Dragon voice recognition software and may include unintentional dictation errors.   Lavonia Drafts, MD 04/10/19 2033

## 2019-04-02 NOTE — Consult Note (Signed)
CODE SEPSIS - PHARMACY COMMUNICATION  **Broad Spectrum Antibiotics should be administered within 1 hour of Sepsis diagnosis**  Time Code Sepsis Called/Page Received: 1224  Antibiotics Ordered: metronidazole, vancomycin and cefepime  Time of 1st antibiotic administration: 1246  Additional action taken by pharmacy: none required  If necessary, Name of Provider/Nurse Contacted: N/A  Dallie Piles ,PharmD Clinical Pharmacist  04/02/2019  1:23 PM

## 2019-04-03 ENCOUNTER — Inpatient Hospital Stay: Payer: Medicare Other

## 2019-04-03 LAB — MRSA PCR SCREENING: MRSA by PCR: NEGATIVE

## 2019-04-03 LAB — CBC
HCT: 35.8 % — ABNORMAL LOW (ref 39.0–52.0)
Hemoglobin: 12 g/dL — ABNORMAL LOW (ref 13.0–17.0)
MCH: 30.6 pg (ref 26.0–34.0)
MCHC: 33.5 g/dL (ref 30.0–36.0)
MCV: 91.3 fL (ref 80.0–100.0)
Platelets: 149 10*3/uL — ABNORMAL LOW (ref 150–400)
RBC: 3.92 MIL/uL — ABNORMAL LOW (ref 4.22–5.81)
RDW: 12.4 % (ref 11.5–15.5)
WBC: 13.8 10*3/uL — ABNORMAL HIGH (ref 4.0–10.5)
nRBC: 0 % (ref 0.0–0.2)

## 2019-04-03 LAB — GLUCOSE, CAPILLARY
Glucose-Capillary: 260 mg/dL — ABNORMAL HIGH (ref 70–99)
Glucose-Capillary: 275 mg/dL — ABNORMAL HIGH (ref 70–99)
Glucose-Capillary: 309 mg/dL — ABNORMAL HIGH (ref 70–99)
Glucose-Capillary: 313 mg/dL — ABNORMAL HIGH (ref 70–99)
Glucose-Capillary: 350 mg/dL — ABNORMAL HIGH (ref 70–99)

## 2019-04-03 LAB — BASIC METABOLIC PANEL
Anion gap: 10 (ref 5–15)
BUN: 30 mg/dL — ABNORMAL HIGH (ref 8–23)
CO2: 18 mmol/L — ABNORMAL LOW (ref 22–32)
Calcium: 7.5 mg/dL — ABNORMAL LOW (ref 8.9–10.3)
Chloride: 102 mmol/L (ref 98–111)
Creatinine, Ser: 1.77 mg/dL — ABNORMAL HIGH (ref 0.61–1.24)
GFR calc Af Amer: 43 mL/min — ABNORMAL LOW (ref >60)
GFR calc non Af Amer: 37 mL/min — ABNORMAL LOW (ref >60)
Glucose, Bld: 322 mg/dL — ABNORMAL HIGH (ref 70–99)
Potassium: 4.4 mmol/L (ref 3.5–5.1)
Sodium: 130 mmol/L — ABNORMAL LOW (ref 135–145)

## 2019-04-03 LAB — RESPIRATORY PANEL BY PCR

## 2019-04-03 LAB — URINE CULTURE: Culture: NO GROWTH

## 2019-04-03 LAB — HEMOGLOBIN A1C
Hgb A1c MFr Bld: 6.9 % — ABNORMAL HIGH (ref 4.8–5.6)
Mean Plasma Glucose: 151.33 mg/dL

## 2019-04-03 LAB — MYCOPHENOLIC ACID (CELLCEPT)
MPA Glucuronide: 45 ug/mL (ref 15–125)
MPA: 3.6 ug/mL — ABNORMAL HIGH (ref 1.0–3.5)

## 2019-04-03 MED ORDER — INSULIN GLARGINE 100 UNIT/ML ~~LOC~~ SOLN
15.0000 [IU] | Freq: Every day | SUBCUTANEOUS | Status: DC
  Filled 2019-04-03: qty 0.15

## 2019-04-03 MED ORDER — SODIUM CHLORIDE 0.9 % IV SOLN
INTRAVENOUS | Status: DC | PRN
  Administered 2019-04-03: 18:00:00 250 mL via INTRAVENOUS

## 2019-04-03 MED ORDER — METHYLPREDNISOLONE SODIUM SUCC 125 MG IJ SOLR
60.0000 mg | Freq: Every day | INTRAMUSCULAR | Status: DC
  Administered 2019-04-04: 09:00:00 60 mg via INTRAVENOUS
  Filled 2019-04-03: qty 2

## 2019-04-03 MED ORDER — INSULIN GLARGINE 100 UNIT/ML ~~LOC~~ SOLN
20.0000 [IU] | Freq: Every day | SUBCUTANEOUS | Status: DC
  Administered 2019-04-03: 22:00:00 20 [IU] via SUBCUTANEOUS
  Filled 2019-04-03 (×2): qty 0.2

## 2019-04-03 MED ORDER — INSULIN REGULAR HUMAN 100 UNIT/ML IJ SOLN
5.0000 [IU] | Freq: Once | INTRAMUSCULAR | Status: AC
  Administered 2019-04-03: 02:00:00 5 [IU] via INTRAVENOUS
  Filled 2019-04-03 (×2): qty 10

## 2019-04-03 MED ORDER — INSULIN ASPART 100 UNIT/ML ~~LOC~~ SOLN
5.0000 [IU] | Freq: Three times a day (TID) | SUBCUTANEOUS | Status: DC
  Administered 2019-04-03 – 2019-04-04 (×3): 5 [IU] via SUBCUTANEOUS
  Filled 2019-04-03 (×3): qty 1

## 2019-04-03 NOTE — Progress Notes (Signed)
Pt blood sugar at 2200 was 200.  Gave 10 units Lantus.  Pt states he would take 35 units Lantus at home for blood sugar or 200.  Retook blood sugar ap pa's request.  Blood sugar 313.  Spoke with Dr Jannifer Franklin.  Pt given 5 units of regular insulin IV.  Dorna Bloom RN

## 2019-04-03 NOTE — Progress Notes (Signed)
[facility] [place] NAME: Brian Pacheco    MR#:  [id number]  DATE OF BIRTH:  1941-04-13  SUBJECTIVE:   he presented to the hospital due to shortness of breath and suspected sepsis.  Source of sepsis is secondary to pneumonia.  he feels better since yesterday.  REVIEW OF SYSTEMS:    Review of Systems  Constitutional: Negative for chills and fever.  HENT: Negative for congestion and tinnitus.   Eyes: Negative for blurred vision and double vision.  Respiratory: Positive for shortness of breath. Negative for cough and wheezing.   Cardiovascular: Negative for chest pain, orthopnea and PND.  Gastrointestinal: Negative for abdominal pain, diarrhea, nausea and vomiting.  Genitourinary: Negative for dysuria and hematuria.  Neurological: Negative for dizziness, sensory change and focal weakness.  All other systems reviewed and are negative.   Nutrition: Heart Healthy/Carb control Tolerating Diet: Yes Tolerating PT: Ambulatory   DRUG ALLERGIES:   Allergies  Allergen Reactions  . Sitagliptin Other (See Comments)    blisters    VITALS:  Blood pressure 130/79, pulse (!) 109, temperature 99.4 F (37.4 C), temperature source Oral, resp. rate 18, height 5\' 7"  (1.702 m), weight 98 kg, SpO2 96 %.  PHYSICAL EXAMINATION:   Physical Exam  GENERAL:  78 y.o.-year-old patient lying in bed in no acute distress.  EYES: Pupils equal, round, reactive to light and accommodation. No scleral icterus. Extraocular muscles intact.  HEENT: Head atraumatic, normocephalic. Oropharynx and nasopharynx clear.  NECK:  Supple, no jugular venous distention. No thyroid enlargement, no tenderness.  LUNGS: Normal breath sounds bilaterally, no wheezing, rales, rhonchi. No use of accessory muscles of respiration.  CARDIOVASCULAR: S1, S2 normal. No murmurs, rubs, or gallops.  ABDOMEN: Soft, nontender, nondistended. Bowel sounds present. No organomegaly or mass.   EXTREMITIES: No cyanosis, clubbing or edema b/l.    NEUROLOGIC: Cranial nerves II through XII are intact. No focal Motor or sensory deficits b/l.   PSYCHIATRIC: The patient is alert and oriented x 3.  SKIN: No obvious rash, lesion, or ulcer.    LABORATORY PANEL:   CBC Recent Labs  Lab 04/03/19 0500  WBC 13.8*  HGB 12.0*  HCT 35.8*  PLT 149*   ------------------------------------------------------------------------------------------------------------------  Chemistries  Recent Labs  Lab 04/02/19 1121 04/03/19 0500  NA 132* 130*  K 4.4 4.4  CL 99 102  CO2 21* 18*  GLUCOSE 172* 322*  BUN 21 30*  CREATININE 1.46* 1.77*  CALCIUM 8.7* 7.5*  AST 22  --   ALT 27  --   ALKPHOS 71  --   BILITOT 1.0  --    ------------------------------------------------------------------------------------------------------------------  Cardiac Enzymes No results for input(s): TROPONINI in the last 168 hours. ------------------------------------------------------------------------------------------------------------------  RADIOLOGY:  Dg Chest 2 View  Result Date: 04/03/2019 CLINICAL DATA:  Heavy wheezing EXAM: CHEST - 2 VIEW COMPARISON:  04/02/2019 FINDINGS: Cardiac shadows within normal limits. The lungs are well aerated bilaterally. Mild lingular atelectatic changes are noted. No sizable effusion is seen. No bony abnormality is noted. IMPRESSION: Mild lingular atelectasis. Electronically Signed   By: Inez Catalina M.D.   On: 04/03/2019 08:58   Dg Chest Port 1 View  Result Date: 04/02/2019 CLINICAL DATA:  Sepsis EXAM: PORTABLE CHEST 1 VIEW COMPARISON:  04/02/2019 FINDINGS: The cardiac silhouette, mediastinal and hilar contours are within normal limits and stable. Mild tortuosity of the thoracic aorta. The lungs are clear of an acute process. No infiltrates, edema or effusions. No worrisome pulmonary  lesions. The bony thorax is intact. IMPRESSION: No acute cardiopulmonary findings.  Electronically Signed   By: Marijo Sanes M.D.   On: 04/02/2019 17:55   Dg Chest Port 1 View  Result Date: 04/02/2019 CLINICAL DATA:  Fever and shortness of breath today. EXAM: PORTABLE CHEST 1 VIEW COMPARISON:  None. FINDINGS: The lungs are clear. Heart size is normal. No pneumothorax or pleural fluid. No bony abnormality. IMPRESSION: Negative chest. Electronically Signed   By: Inge Rise M.D.   On: 04/02/2019 12:05     ASSESSMENT AND PLAN:   78 y.o. male with a known history of hypertension, hyperlipidemia, type 2 diabetes, peripheral vascular disease, liver cirrhosis s/p liver transplant in 2004 who presented to the ED with shortness of breath.  ** Sepsis-likely secondary to community-acquired pneumonia based on clinical presentation, although chest x-ray is negative.  No other sources of infection identified.  Meeting sepsis criteria on admission with tachycardia, tachypnea, and leukocytosis.  Also with fever at home. -COVID negative -Continue IV ceftriaxone, Zithromax.  Cultures remain negative so far.  Currently afebrile. -Patient's procalcitonin level was normal.  ** Acute hypoxic respiratory failure- due to community-acquired pneumonia and COPD exacerbation -Continue IV steroids, duo nebs, Pulmicort nebs.  Will taper IV steroids. -Continue antibiotics as mentioned above.   ** History of liver cirrhosis-S/P liver transplant in 2004 -Continue home CellCept  ** Hypertension- Continue home Norvasc and losartan  ** Type 2 diabetes- appreciate Diabetes coordinator consult.  - cont. Lantus, Novolog with meals and SSI and follow BS  ** CKD 3- creatinine at baseline. -Avoid nephrotoxic agents -Monitor  ** Hyperlipidemia-stable -Continue home Crestor and fenofibrate  ** Depression-stable -Continue home Effexor     All the records are reviewed and case discussed with Care Management/Social Worker. Management plans discussed with the patient, family and they are in  agreement.  CODE STATUS: Full code  DVT Prophylaxis: Lovenox  TOTAL TIME TAKING CARE OF THIS PATIENT: 30 minutes.   POSSIBLE D/C IN 1-2 DAYS, DEPENDING ON CLINICAL CONDITION.   Henreitta Leber M.D on 04/03/2019 at 3:50 PM  Between 7am to 6pm - Pager - [phone]  After 6pm go to [email or web address] - Proofreader  [facility]  Office  [phone]  CC: Primary care physician; Derinda Late, MD

## 2019-04-03 NOTE — Evaluation (Signed)
Physical Therapy Evaluation Patient Details Name: Brian Pacheco MRN: [id number] DOB: Mar 26, 1941 Today's Date: 04/03/2019   History of Present Illness  Brian Pacheco is a 78 y.o male who presented to the ED on 04/02/2019 with complaints of shortness of breath and flu-like symptoms, ambulatory upon arrival. He was admitted to the hospital with diagnosis of sepsis and acute hypoxis respiratory failure. He is s/p liver transplant in 2004. 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    Clinical Impression  Patient is alert and oriented x 4 and able to provide a detailed history. Prior to hospitalization he was I in all aspects of care and lived in a level entrance one story home. Upon physical therapy evaluation, patient was I with bed mobility and transfers. He required CGA for safety during ambulation and cuing not to reach for and hold onto walls. He demo some increased sway during ambulation. He had significant dyspnea with all mobility and fatigued quickly. SpO2 remained mid to high 90s and HR between 107 and 115 bpm during functional mobility. Patient appears to have experienced a decrease in functional mobility and would benefit from physical therapy to address impairments and functional limitations (see PT Problem List below) to work towards return to PLOF or maximal functional independence.       Follow Up Recommendations Home health PT    Equipment Recommendations  None recommended by PT    Recommendations for Other Services OT consult     Precautions / Restrictions Precautions Precautions: Fall;Other (comment) Precaution Comments: gets short of breath easily, tachycardic Restrictions Weight Bearing Restrictions: No      Mobility  Bed Mobility Overal bed mobility: Independent             General bed mobility comments: Independent with Supine <> sit from flat bed.   Transfers Overall transfer level:  Independent Equipment used: None             General transfer comment: sit <> stand from bed  Ambulation/Gait Ambulation/Gait assistance: Supervision Gait Distance (Feet): 310 Feet Assistive device: None Gait Pattern/deviations: Drifts right/left Gait velocity: decreased   General Gait Details: Patient ambulated with no AD, drifting a bit L away from clinician at times with some reaching for the wall, but able to correct with cuing without LOB. Sway increased from normal.   Stairs            Wheelchair Mobility    Modified Rankin (Stroke Patients Only)       Balance Overall balance assessment: Needs assistance Sitting-balance support: No upper extremity supported;Feet supported Sitting balance-Leahy Scale: Good Sitting balance - Comments: able to don socks   Standing balance support: No upper extremity supported;During functional activity Standing balance-Leahy Scale: Good Standing balance comment: reaching for walls at times and increased sway but able to maintain balance duirng ambulation when cued not to reach for wall. reports no falls in last 5 months.                              Pertinent Vitals/Pain Pain Assessment: No/denies pain    Home Living Family/patient expects to be discharged to:: Private residence Living Arrangements: Spouse/significant other(wife does not require physical assistance) Available Help at Discharge: Family Type of Home: House(part of a triplex) Home Access: Level entry     Home Layout: One level Home Equipment: None      Prior Function  Level of Independence: Independent         Comments: patient was indpendent with all aspects of care including driving. He is retired.      Hand Dominance        Extremity/Trunk Assessment   Upper Extremity Assessment Upper Extremity Assessment: Overall WFL for tasks assessed    Lower Extremity Assessment Lower Extremity Assessment: Generalized weakness     Cervical / Trunk Assessment Cervical / Trunk Assessment: Normal  Communication   Communication: No difficulties  Cognition Arousal/Alertness: Awake/alert Behavior During Therapy: WFL for tasks assessed/performed Overall Cognitive Status: Within Functional Limits for tasks assessed                                 General Comments: patient A&Ox4      General Comments General comments (skin integrity, edema, etc.): tremor in hands at rest that mildly concerns pt. He states this is new for him. Patient quite short of breath with all activity but O2 sat remained in mid to high 90s. HR up to 114 bpm during exertion, tachycardic 108 bpm at rest.     Exercises Other Exercises Other Exercises: reviewed pursed lip breathing and techniques to reduce anxiety associated with feeling out of breath. Educated on good SpO2 numbers and what that means despite pt feeling very short of breath.    Assessment/Plan    PT Assessment Patient needs continued PT services  PT Problem List Decreased strength;Decreased balance;Decreased knowledge of precautions;Decreased mobility;Cardiopulmonary status limiting activity;Decreased activity tolerance       PT Treatment Interventions DME instruction;Functional mobility training;Balance training;Patient/family education;Gait training;Therapeutic activities;Neuromuscular re-education;Stair training;Therapeutic exercise    PT Goals (Current goals can be found in the Care Plan section)  Acute Rehab PT Goals Patient Stated Goal: return home PT Goal Formulation: With patient Time For Goal Achievement: 04/17/19 Potential to Achieve Goals: Good    Frequency Min 2X/week   Barriers to discharge        Co-evaluation               AM-PAC PT "6 Clicks" Mobility  Outcome Measure Help needed turning from your back to your side while in a flat bed without using bedrails?: None Help needed moving from lying on your back to sitting on the side of  a flat bed without using bedrails?: None Help needed moving to and from a bed to a chair (including a wheelchair)?: None Help needed standing up from a chair using your arms (e.g., wheelchair or bedside chair)?: None Help needed to walk in hospital room?: None Help needed climbing 3-5 steps with a railing? : A Little 6 Click Score: 23    End of Session Equipment Utilized During Treatment: Gait belt Activity Tolerance: Patient tolerated treatment well;Patient limited by fatigue Patient left: in bed;with call bell/phone within reach(seated on edge of bed) Nurse Communication: Mobility status;Other (comment)(confirmed no need for chair alarm due to fall risk score below 10) PT Visit Diagnosis: Unsteadiness on feet (R26.81);Difficulty in walking, not elsewhere classified (R26.2);Muscle weakness (generalized) (M62.81)    Time: 1005-1020 PT Time Calculation (min) (ACUTE ONLY): 15 min   Charges:   PT Evaluation $PT Eval Low Complexity: 1 Low PT Treatments $Gait Training: 8-22 mins        Everlean Alstrom. Graylon Good, PT, DPT 04/03/19, 11:09 AM

## 2019-04-03 NOTE — Progress Notes (Signed)
Inpatient Diabetes Program Recommendations  AACE/ADA: New Consensus Statement on Inpatient Glycemic Control   Target Ranges:  Prepandial:   less than 140 mg/dL      Peak postprandial:   less than 180 mg/dL (1-2 hours)      Critically ill patients:  140 - 180 mg/dL   Results for Brian Pacheco, Brian Pacheco (MRN [id number]) as of 04/03/2019 13:28  Ref. Range 04/02/2019 21:12 04/03/2019 00:08 04/03/2019 08:05 04/03/2019 11:51  Glucose-Capillary Latest Ref Range: 70 - 99 mg/dL 200 (H)  Lantus 10 units@21 :44 313 (H)  Regular 5 units 309 (H)  Novolog 11 units 260 (H)  Novolog 8 units   Review of Glycemic Control  Diabetes history: DM2 Outpatient Diabetes medications: Lantus 50 units QAM, Novolog QAM (no specific dose or how often he actually takes it), Metformin 500 mg BID, Glipizide 5 mg BID Current orders for Inpatient glycemic control: Lantus 15 units QHS, Novolog 0-15 units TID with meals, Novolog 0-5 units QHS; Solumedrol 60 mg Q12H  Inpatient Diabetes Program Recommendations:   Insulin - Basal: Please consider increasing Lantus further to 20 units QHS (based on 98 kg x 0.2 units).  Insulin - Meal Coverage: Please consider ordering Novolog 5 units TID with meals for meal coverage if patient eats at least 50% of meals.  HgbA1C: A1C in process. Patient reports last A1C was 7% on 03/14/19 (verified in [place]).  NOTE: Spoke with patient over the phone about diabetes and home regimen for diabetes control. Patient reports he use to go to the [place] [facility] in [place] but now has a local PCP that manages his DM.  When inquiring about patient's outpatient DM medications, patient reports that he takes Lantus 50 units QAM (but changes the dose sometimes), Novolog QAM (not able to provide any specific dose or when he actually takes it as he does not always take the Novolog each day), Glipzide 5 mg BID, and Metformin 500 mg BID. Patient states that he has never really had a good understanding about the insulins he  takes or exactly how he is suppose to be taking them. Discussed Lantus and Novolog insulin in detail and explained how they both work for DM control.  Patient states that he had read that insulins should not be mixed so he is careful not to take them close together. Explained that Lantus should not be mixed in same syringe as any other insulins and that is what is meant by "not mixing the insulin".  Explained that Lantus and Novolog can be given around the same time but they would need to be in separate syringes and given in 2 different locations.  Patient reports checking glucose 5-6 times per day and notes that his glucose has ranged from 40-200's mg/dl over the past 2 weeks. Patient notes that he experiences hypoglycemia 1 time a week and it usually occurs on days he is more active.  Inquired about prior A1C and patient reports his last A1C value was 7%. Informed patient that current A1C is in process. Discussed glucose and A1C goals. Discussed importance of checking CBGs and maintaining good CBG control to prevent long-term and short-term complications. Explained how hyperglycemia leads to damage within blood vessels which lead to the common complications seen with uncontrolled diabetes. Discussed impact of nutrition, exercise, stress, sickness, and medications on diabetes control.  Explained that on days he is more active, he is using glucose for energy which may be why he notes hypoglycemia at times when he is more active. Also informed  patient that he is ordered steroids which is contributing to hyperglycemia.   Encouraged patient to check glucose at least 4 times per day (before meals and at bedtime) and to keep a detailed log book of glucose readings and DM medication taken which patient will need to take to doctor appointments. Explained how the doctor can use the log book to continue to make adjustments with DM medications if needed.  Encouraged patient to ask PCP about being referred to local  Endocrinologist as well to assist with improving DM control. In talking with patient he also noted that his triglycerides are high. Discussed impact of hypertriglyceridemia on glycemic control as well and how it increases insulin resistance. Patient verbalized understanding of information discussed and reports no further questions at this time related to diabetes. Patient appreciative of information discussed.   Thanks, Barnie Alderman, RN, MSN, CDE Diabetes Coordinator Inpatient Diabetes Program [phone] (Team Pager)

## 2019-04-04 ENCOUNTER — Inpatient Hospital Stay: Payer: Medicare Other

## 2019-04-04 LAB — GLUCOSE, CAPILLARY
Glucose-Capillary: 184 mg/dL — ABNORMAL HIGH (ref 70–99)
Glucose-Capillary: 250 mg/dL — ABNORMAL HIGH (ref 70–99)
Glucose-Capillary: 226 mg/dL — ABNORMAL HIGH (ref 70–99)

## 2019-04-04 LAB — CBC
HCT: 33.6 % — ABNORMAL LOW (ref 39.0–52.0)
Hemoglobin: 11.4 g/dL — ABNORMAL LOW (ref 13.0–17.0)
MCH: 31.1 pg (ref 26.0–34.0)
MCHC: 33.9 g/dL (ref 30.0–36.0)
MCV: 91.6 fL (ref 80.0–100.0)
Platelets: 165 10*3/uL (ref 150–400)
RBC: 3.67 MIL/uL — ABNORMAL LOW (ref 4.22–5.81)
RDW: 12.6 % (ref 11.5–15.5)
WBC: 16.7 10*3/uL — ABNORMAL HIGH (ref 4.0–10.5)
nRBC: 0 % (ref 0.0–0.2)

## 2019-04-04 LAB — BASIC METABOLIC PANEL
Anion gap: 9 (ref 5–15)
BUN: 42 mg/dL — ABNORMAL HIGH (ref 8–23)
CO2: 21 mmol/L — ABNORMAL LOW (ref 22–32)
Calcium: 8 mg/dL — ABNORMAL LOW (ref 8.9–10.3)
Chloride: 107 mmol/L (ref 98–111)
Creatinine, Ser: 1.88 mg/dL — ABNORMAL HIGH (ref 0.61–1.24)
GFR calc Af Amer: 40 mL/min — ABNORMAL LOW (ref >60)
GFR calc non Af Amer: 34 mL/min — ABNORMAL LOW (ref >60)
Glucose, Bld: 264 mg/dL — ABNORMAL HIGH (ref 70–99)
Potassium: 4.4 mmol/L (ref 3.5–5.1)
Sodium: 137 mmol/L (ref 135–145)

## 2019-04-04 MED ORDER — BUDESONIDE-FORMOTEROL FUMARATE 80-4.5 MCG/ACT IN AERO: 2.0000 | INHALATION_SPRAY | Freq: Two times a day (BID) | RESPIRATORY_TRACT | 2 refills | Status: DC

## 2019-04-04 MED ORDER — PREDNISONE 10 MG PO TABS: ORAL_TABLET | ORAL | 0 refills | Status: DC

## 2019-04-04 MED ORDER — DOXYCYCLINE HYCLATE 100 MG PO CAPS: 100.0000 mg | ORAL_CAPSULE | Freq: Two times a day (BID) | ORAL | 0 refills | Status: AC

## 2019-04-04 MED ORDER — PHENOL 1.4 % MT LIQD
1.0000 | OROMUCOSAL | Status: DC | PRN
  Filled 2019-04-04: qty 177

## 2019-04-04 NOTE — Discharge Summary (Signed)
[facility] NAME: Brian Pacheco    MR#:  [id number]  DATE OF BIRTH:  1941-08-27  DATE OF ADMISSION:  04/02/2019 ADMITTING PHYSICIAN: Sela Hua, MD  DATE OF DISCHARGE: 04/04/2019  PRIMARY CARE PHYSICIAN: Derinda Late, MD    ADMISSION DIAGNOSIS:  Sepsis without acute organ dysfunction, due to unspecified organism ([place]) [A41.9]  DISCHARGE DIAGNOSIS:  Active Problems:   Sepsis ([place])   SECONDARY DIAGNOSIS:   Past Medical History:  Diagnosis Date  . Asthma   . Cancer ([place])    skin cancer  . Diabetes mellitus without complication ([place])   . GERD (gastroesophageal reflux disease)   . Hyperlipidemia   . Hypertension   . 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 Peripheral vascular disease [facility])     HOSPITAL COURSE:   78 y.o.malewith a known history of hypertension, hyperlipidemia, type 2 diabetes, peripheral vascular disease, liver cirrhosis s/p liver transplant in 2004 who presented to the ED with shortness of breath.  ** Sepsis-likely secondary to community-acquired pneumonia based on clinical presentation, although chest x-ray is negative. No other sources of infection identified. Meeting sepsis criteria on admission with tachycardia, tachypnea, and leukocytosis.  -Sepsis has not been ruled out.  Patient's COVID-19 test was negative, procalcitonin levels negative blood cultures remain negative. -Patient was empirically treated with IV ceftriaxone, Zithromax for suspected community-acquired pneumonia and now being discharged on oral doxycycline.  ** Acute hypoxic respiratory failure- due to community-acquired pneumonia and COPD exacerbation -Patient was treated with IV steroids, scheduled duo nebs, Pulmicort nebs.  Patient has improved.  He is not hypoxic. -He will be discharged on oral prednisone taper, doxycycline and maintenance of his inhalers and nebulizer treatments at home as  needed.  ** History of liver cirrhosis-S/P liver transplant in 2004 - Pt. Will Continue home CellCept  ** Hypertension- Continue home Norvasc and losartan  ** Type 2 diabetes- his hemoglobin A1c was noted to be 6.9.  Diabetes coordinator consult is obtained. -Patient to continue his Lantus and NovoLog with meals as stated below.  ** CKD 3- creatinine at baseline and can be further followed as outpatient.   ** Hyperlipidemia-stable -Continue home Crestor and fenofibrate  ** Depression-stable -Continue home Effexor  Discharge home with Home Health services today.   DISCHARGE CONDITIONS:   Stable.   CONSULTS OBTAINED:    DRUG ALLERGIES:   Allergies  Allergen Reactions  . Sitagliptin Other (See Comments)    blisters    DISCHARGE MEDICATIONS:   Allergies as of 04/04/2019      Reactions   Sitagliptin Other (See Comments)   blisters      Medication List    STOP taking these medications   tiotropium 18 MCG inhalation capsule Commonly known as:  SPIRIVA     TAKE these medications   albuterol 108 (90 Base) MCG/ACT inhaler Commonly known as:  VENTOLIN HFA Inhale 2 puffs into the lungs every 4 (four) hours as needed.   amLODipine 10 MG tablet Commonly known as:  NORVASC Take 10 mg by mouth daily.   aspirin EC 81 MG tablet Take 1 tablet (81 mg total) by mouth daily. What changed:  when to take this   budesonide-formoterol 80-4.5 MCG/ACT inhaler Commonly known as:  SYMBICORT Inhale 2 puffs into the lungs 2 (two) times a day.   clopidogrel 75 MG tablet Commonly known as:  PLAVIX Take 75 mg by mouth daily.  doxycycline 100 MG capsule Commonly known as:  VIBRAMYCIN Take 1 capsule (100 mg total) by mouth 2 (two) times daily for 5 days.   fenofibrate 160 MG tablet Take 160 mg by mouth daily.   glipiZIDE 5 MG tablet Commonly known as:  GLUCOTROL Take 5 mg by mouth 2 (two) times daily before a meal.   insulin aspart 100 UNIT/ML FlexPen Commonly known  as:  NOVOLOG Inject into the skin. Sliding scale   Lantus 100 UNIT/ML injection Generic drug:  insulin glargine Inject into the skin. Sliding scale   losartan 100 MG tablet Commonly known as:  COZAAR Take 100 mg by mouth daily.   metFORMIN 500 MG tablet Commonly known as:  GLUCOPHAGE Take 500 mg by mouth 2 (two) times daily with a meal.   mycophenolate 500 MG tablet Commonly known as:  CELLCEPT Take 500 mg by mouth 2 (two) times daily.   omeprazole 20 MG capsule Commonly known as:  PRILOSEC Take 20 mg by mouth daily.   predniSONE 10 MG tablet Commonly known as:  DELTASONE Label  & dispense according to the schedule below. 5 Pills PO for 1 day then, 4 Pills PO for 1 day, 3 Pills PO for 1 day, 2 Pills PO for 1 day, 1 Pill PO for 1 days then STOP.   rosuvastatin 5 MG tablet Commonly known as:  CRESTOR Take 2.5 mg by mouth at bedtime.   sildenafil 100 MG tablet Commonly known as:  VIAGRA Take by mouth.   tamsulosin 0.4 MG Caps capsule Commonly known as:  FLOMAX Take 0.4 mg by mouth daily.   traMADol 50 MG tablet Commonly known as:  ULTRAM Take 50 mg by mouth 2 (two) times daily.   venlafaxine 75 MG tablet Commonly known as:  EFFEXOR Take 75 mg by mouth 3 (three) times daily.         DISCHARGE INSTRUCTIONS:   DIET:  Cardiac diet and Diabetic diet  DISCHARGE CONDITION:  Stable  ACTIVITY:  Activity as tolerated  OXYGEN:  Home Oxygen: No.   Oxygen Delivery: room air  DISCHARGE LOCATION:  Home with Home Health PT, RN.    If you experience worsening of your admission symptoms, develop shortness of breath, life threatening emergency, suicidal or homicidal thoughts you must seek medical attention immediately by calling 911 or calling your MD immediately  if symptoms less severe.  You Must read complete instructions/literature along with all the possible adverse reactions/side effects for all the Medicines you take and that have been prescribed to you. Take  any new Medicines after you have completely understood and accpet all the possible adverse reactions/side effects.   Please note  You were cared for by a hospitalist during your hospital stay. If you have any questions about your discharge medications or the care you received while you were in the hospital after you are discharged, you can call the unit and asked to speak with the hospitalist on call if the hospitalist that took care of you is not available. Once you are discharged, your primary care physician will handle any further medical issues. Please note that NO REFILLS for any discharge medications will be authorized once you are discharged, as it is imperative that you return to your primary care physician (or establish a relationship with a primary care physician if you do not have one) for your aftercare needs so that they can reassess your need for medications and monitor your lab values.     Today  Patient has some mild wheezing and bronchospasm but otherwise much improved since admission.  Afebrile.  Patient complaining of some abdominal bloating and distention but KUB this morning showing no evidence of any acute pathology.  Will discharge home today on prednisone and empiric antibiotics.  VITAL SIGNS:  Blood pressure 131/73, pulse (!) 108, temperature 97.7 F (36.5 C), temperature source Oral, resp. rate 16, height 5\' 7"  (1.702 m), weight 98 kg, SpO2 94 %.  I/O:    Intake/Output Summary (Last 24 hours) at 04/04/2019 1437 Last data filed at 04/04/2019 1300 Gross per 24 hour  Intake 584.71 ml  Output -  Net 584.71 ml    PHYSICAL EXAMINATION:   GENERAL:  78 y.o.-year-old patient lying in bed in no acute distress.  EYES: Pupils equal, round, reactive to light and accommodation. No scleral icterus. Extraocular muscles intact.  HEENT: Head atraumatic, normocephalic. Oropharynx and nasopharynx clear.  NECK:  Supple, no jugular venous distention. No thyroid enlargement, no  tenderness.  LUNGS: Normal breath sounds bilaterally, minimal end-exp. Wheezing b/l, No rales, rhonchi. No use of accessory muscles of respiration.  CARDIOVASCULAR: S1, S2 normal. No murmurs, rubs, or gallops.  ABDOMEN: Soft, nontender, nondistended. Bowel sounds present. No organomegaly or mass.  EXTREMITIES: No cyanosis, clubbing or edema b/l.    NEUROLOGIC: Cranial nerves II through XII are intact. No focal Motor or sensory deficits b/l.   PSYCHIATRIC: The patient is alert and oriented x 3.  SKIN: No obvious rash, lesion, or ulcer.   DATA REVIEW:   CBC Recent Labs  Lab 04/04/19 0427  WBC 16.7*  HGB 11.4*  HCT 33.6*  PLT 165    Chemistries  Recent Labs  Lab 04/02/19 1121  04/04/19 0427  NA 132*   < > 137  K 4.4   < > 4.4  CL 99   < > 107  CO2 21*   < > 21*  GLUCOSE 172*   < > 264*  BUN 21   < > 42*  CREATININE 1.46*   < > 1.88*  CALCIUM 8.7*   < > 8.0*  AST 22  --   --   ALT 27  --   --   ALKPHOS 71  --   --   BILITOT 1.0  --   --    < > = values in this interval not displayed.    Cardiac Enzymes No results for input(s): TROPONINI in the last 168 hours.  Microbiology Results  Results for orders placed or performed during the hospital encounter of 04/02/19  Culture, blood (Routine x 2)     Status: None (Preliminary result)   Collection Time: 04/02/19 11:21 AM  Result Value Ref Range Status   Specimen Description BLOOD BLOOD LEFT FOREARM  Final   Special Requests   Final    BOTTLES DRAWN AEROBIC AND ANAEROBIC Blood Culture adequate volume   Culture   Final    NO GROWTH 2 DAYS Performed at [facility], [address] [place], [place] [address]    Report Status PENDING  Incomplete  Culture, blood (Routine x 2)     Status: None (Preliminary result)   Collection Time: 04/02/19 11:26 AM  Result Value Ref Range Status   Specimen Description BLOOD LEFT ANTECUBITAL  Final   Special Requests   Final    BOTTLES DRAWN AEROBIC AND ANAEROBIC Blood Culture  adequate volume   Culture   Final    NO GROWTH 2 DAYS Performed at [facility],  [place], [place] [address]    Report Status PENDING  Incomplete  SARS Coronavirus 2 (CEPHEID- Performed in [place] hospital lab), Hosp Order     Status: None   Collection Time: 04/02/19 11:26 AM  Result Value Ref Range Status   SARS Coronavirus 2 NEGATIVE NEGATIVE Final    Comment: (NOTE) If result is NEGATIVE SARS-CoV-2 target nucleic acids are NOT DETECTED. The SARS-CoV-2 RNA is generally detectable in upper and lower  respiratory specimens during the acute phase of infection. The lowest  concentration of SARS-CoV-2 viral copies this assay can detect is 250  copies / mL. A negative result does not preclude SARS-CoV-2 infection  and should not be used as the sole basis for treatment or other  patient management decisions.  A negative result may occur with  improper specimen collection / handling, submission of specimen other  than nasopharyngeal swab, presence of viral mutation(s) within the  areas targeted by this assay, and inadequate number of viral copies  (<250 copies / mL). A negative result must be combined with clinical  observations, patient history, and epidemiological information. If result is POSITIVE SARS-CoV-2 target nucleic acids are DETECTED. The SARS-CoV-2 RNA is generally detectable in upper and lower  respiratory specimens dur ing the acute phase of infection.  Positive  results are indicative of active infection with SARS-CoV-2.  Clinical  correlation with patient history and other diagnostic information is  necessary to determine patient infection status.  Positive results do  not rule out bacterial infection or co-infection with other viruses. If result is PRESUMPTIVE POSTIVE SARS-CoV-2 nucleic acids MAY BE PRESENT.   A presumptive positive result was obtained on the submitted specimen  and confirmed on repeat testing.  While 2019 novel coronavirus   (SARS-CoV-2) nucleic acids may be present in the submitted sample  additional confirmatory testing may be necessary for epidemiological  and / or clinical management purposes  to differentiate between  SARS-CoV-2 and other Sarbecovirus currently known to infect humans.  If clinically indicated additional testing with an alternate test  methodology [phone]) is advised. The SARS-CoV-2 RNA is generally  detectable in upper and lower respiratory sp ecimens during the acute  phase of infection. The expected result is Negative. Fact Sheet for Patients:  StrictlyIdeas.no Fact Sheet for Healthcare Providers: BankingDealers.co.za This test is not yet approved or cleared by the [place] FDA and has been authorized for detection and/or diagnosis of SARS-CoV-2 by FDA under an Emergency Use Authorization (EUA).  This EUA will remain in effect (meaning this test can be used) for the duration of the COVID-19 declaration under Section 564(b)(1) of the Act, 21 U.S.C. section 360bbb-3(b)(1), unless the authorization is terminated or revoked sooner. Performed at [facility], [address] [place], [place] [address]   Urine culture     Status: None   Collection Time: 04/02/19  2:20 PM  Result Value Ref Range Status   Specimen Description   Final    URINE, RANDOM Performed at [facility], [address] [place], [place] [address]    Special Requests   Final    NONE Performed at [facility], [address] [place], [place] [address]    Culture   Final    NO GROWTH Performed at [facility], [place] [address], [place], [place] [address]    Report Status 04/03/2019 FINAL  Final  Respiratory Panel by PCR     Status: None   Collection Time: 04/02/19  5:31 PM  Result Value Ref Range Status   Adenovirus NOT DETECTED NOT DETECTED Final   Coronavirus 229E NOT DETECTED NOT DETECTED Final    Comment: (NOTE) The  Coronavirus on the Respiratory Panel, DOES NOT test for the novel  Coronavirus (2019 nCoV)    Coronavirus HKU1 NOT DETECTED NOT DETECTED Final   Coronavirus NL63 NOT DETECTED NOT DETECTED Final   Coronavirus OC43 NOT DETECTED NOT DETECTED Final   Metapneumovirus NOT DETECTED NOT DETECTED Final   Rhinovirus / Enterovirus NOT DETECTED NOT DETECTED Final   Influenza A NOT DETECTED NOT DETECTED Final   Influenza B NOT DETECTED NOT DETECTED Final   Parainfluenza Virus 1 NOT DETECTED NOT DETECTED Final   Parainfluenza Virus 2 NOT DETECTED NOT DETECTED Final   Parainfluenza Virus 3 NOT DETECTED NOT DETECTED Final   Parainfluenza Virus 4 NOT DETECTED NOT DETECTED Final   Respiratory Syncytial Virus NOT DETECTED NOT DETECTED Final   Bordetella pertussis NOT DETECTED NOT DETECTED Final   Chlamydophila pneumoniae NOT DETECTED NOT DETECTED Final   Mycoplasma pneumoniae NOT DETECTED NOT DETECTED Final    Comment: Performed at [facility], [place] [address] [place], [place] [address]  MRSA PCR Screening     Status: None   Collection Time: 04/03/19 12:27 PM  Result Value Ref Range Status   MRSA by PCR NEGATIVE NEGATIVE Final    Comment:        The GeneXpert MRSA Assay (FDA approved for NASAL specimens only), is one component of a comprehensive MRSA colonization surveillance program. It is not intended to diagnose MRSA infection nor to guide or monitor treatment for MRSA infections. Performed at [facility], [address] [place], [place] [address]     RADIOLOGY:  Dg Chest 2 View  Result Date: 04/03/2019 CLINICAL DATA:  Heavy wheezing EXAM: CHEST - 2 VIEW COMPARISON:  04/02/2019 FINDINGS: Cardiac shadows within normal limits. The lungs are well aerated bilaterally. Mild lingular atelectatic changes are noted. No sizable effusion is seen. No bony abnormality is noted. IMPRESSION: Mild lingular atelectasis. Electronically Signed   By: Inez Catalina M.D.   On: 04/03/2019 08:58    Dg Abd 1 View  Result Date: 04/04/2019 CLINICAL DATA:  Abdominal distension EXAM: ABDOMEN - 1 VIEW COMPARISON:  None. FINDINGS: Scattered large and small bowel gas is noted. No obstructive changes are noted. No free air is seen. Mild degenerative changes of the lumbar spine are noted. IMPRESSION: No acute abnormality noted. Electronically Signed   By: Inez Catalina M.D.   On: 04/04/2019 10:29   Dg Chest Port 1 View  Result Date: 04/02/2019 CLINICAL DATA:  Sepsis EXAM: PORTABLE CHEST 1 VIEW COMPARISON:  04/02/2019 FINDINGS: The cardiac silhouette, mediastinal and hilar contours are within normal limits and stable. Mild tortuosity of the thoracic aorta. The lungs are clear of an acute process. No infiltrates, edema or effusions. No worrisome pulmonary lesions. The bony thorax is intact. IMPRESSION: No acute cardiopulmonary findings. Electronically Signed   By: Marijo Sanes M.D.   On: 04/02/2019 17:55      Management plans discussed with the patient, family and they are in agreement.  CODE STATUS:     Code Status Orders  (From admission, onward)         Start     Ordered   04/02/19 1731  Full code  Continuous     04/02/19 1730       TOTAL TIME TAKING CARE OF THIS PATIENT: 40 minutes.  Henreitta Leber M.D on 04/04/2019 at 2:37 PM  Between 7am to 6pm - Pager - [phone]  After 6pm go to [email or web address] - Proofreader  [facility]  Office  [phone]  CC: Primary care physician; Derinda Late, MD

## 2019-04-04 NOTE — Progress Notes (Signed)
Inpatient Diabetes Program Recommendations  AACE/ADA: New Consensus Statement on Inpatient Glycemic Control   Target Ranges:  Prepandial:   less than 140 mg/dL      Peak postprandial:   less than 180 mg/dL (1-2 hours)      Critically ill patients:  140 - 180 mg/dL  Results for Brian Pacheco, Brian Pacheco (MRN [id number]) as of 04/04/2019 08:00  Ref. Range 04/04/2019 04:27  Glucose Latest Ref Range: 70 - 99 mg/dL 264 (H)   Results for Brian Pacheco, Brian Pacheco (MRN [id number]) as of 04/04/2019 08:00  Ref. Range 04/03/2019 08:05 04/03/2019 11:51 04/03/2019 17:10 04/03/2019 22:17  Glucose-Capillary Latest Ref Range: 70 - 99 mg/dL 309 (H) 260 (H) 350 (H) 275 (H)  Results for Brian Pacheco, Brian Pacheco (MRN [id number]) as of 04/04/2019 08:00  Ref. Range 04/03/2019 05:50  Hemoglobin A1C Latest Ref Range: 4.8 - 5.6 % 6.9 (H)   Review of Glycemic Control  Diabetes history: DM2 Outpatient Diabetes medications: Lantus 50 units QAM, Novolog QAM (no specific dose or how often he actually takes it), Metformin 500 mg BID, Glipizide 5 mg BID Current orders for Inpatient glycemic control: Lantus 20 units QHS, Novolog 0-15 units TID with meals, Novolog 0-5 units QHS, Novolog 5 units TID with meals; Solumedrol 60 mg daily  Inpatient Diabetes Program Recommendations:   Insulin - Basal: Please consider increasing Lantus further to 30 units QHS (based on 98 kg x 0.3 units).  Thanks, Barnie Alderman, RN, MSN, CDE Diabetes Coordinator Inpatient Diabetes Program [phone] (Team Pager from 8am to 5pm)

## 2019-04-04 NOTE — Progress Notes (Signed)
OT Cancellation Note  Patient Details Name: Gordan Grell MRN: [id number] DOB: 03-14-1941   Cancelled Treatment:    Reason Eval/Treat Not Completed: Other (comment). Consult received, chart reviewed. Pt preparing for discharge home. Unavailable for OT evaluation at this time. Will re-attempt as appropriate.   Jeni Salles, MPH, MS, OTR/L ascom [phone] 04/04/19, 2:52 PM

## 2019-04-04 NOTE — TOC Transition Note (Signed)
Transition of Care [facility]) - CM/SW Discharge Note   Patient Details  Name: Brian Pacheco MRN: [id number] Date of Birth: [birth year]  [facility] of [facility]) CM/SW Contact:  Annamaria Boots, [place] Phone Number: 04/04/2019, 2:04 PM   Clinical Narrative:  Patient lives with his wife in [place]. CSW notified by MD that patient will need home health services. CSW met with patient and he is agreeable to home health through [facility]. Patient reports that he doesn't have any DME needs at this time. He is medically ready for discharge today and states that his son will transport him home. CSW notified Santiago Glad with [facility] of referral. Patient will discharge home today.      Final next level of care: Home w Home Health Services Barriers to Discharge: No Barriers Identified   Patient Goals and CMS Choice   CMS Medicare.gov Compare Post Acute Care list provided to:: Patient Choice offered to / list presented to : Patient  Discharge Placement                       Discharge Plan and Services     Post Acute Care Choice: Home Health                    HH Arranged: RN, PT [facility] Agency: [place] ([place]) Date [place]: 04/04/19 Time [place]: [place] Representative spoke with at [place]: [place] ([place]) Interventions     Readmission Risk Interventions No flowsheet data found.

## 2019-04-04 NOTE — Progress Notes (Signed)
Pt is d/ced home.  States he's feeling better although he has a sore throat and still has some expiratory wheezes.  He will go home with new inhalers and prednisone taper and doxycycline.  Will review d/c instructions, f/u appts and new medications.  Nurse Tech removed IV.  Pt may have to call a cab for transport if his son can't pick him up.

## 2019-04-07 LAB — CULTURE, BLOOD (ROUTINE X 2)
Culture: NO GROWTH
Culture: NO GROWTH
Special Requests: ADEQUATE
Special Requests: ADEQUATE

## 2019-04-15 ENCOUNTER — Other Ambulatory Visit (HOSPITAL_COMMUNITY): Payer: Self-pay | Admitting: Nephrology

## 2019-04-15 ENCOUNTER — Other Ambulatory Visit: Payer: Self-pay | Admitting: Nephrology

## 2019-04-15 DIAGNOSIS — N183 Chronic kidney disease, stage 3 unspecified: Secondary | ICD-10-CM

## 2019-04-29 ENCOUNTER — Telehealth (INDEPENDENT_AMBULATORY_CARE_PROVIDER_SITE_OTHER): Payer: Self-pay

## 2019-04-29 NOTE — Telephone Encounter (Signed)
Message was left on patient voicemail to return a call back to the office

## 2019-04-29 NOTE — Telephone Encounter (Signed)
Swelling is not uncommon during the day, especially if he is walking or sitting for a majority of the time.  He should try to utilize a pain of compression stockings as this would definitely assist with the swelling. If he would like to be seen we can bring him in for a bilateral venous reflux with me or dew

## 2019-04-29 NOTE — Telephone Encounter (Signed)
Patient was giving [name] NP medical recommendations and verbalize understanding.

## 2019-05-17 ENCOUNTER — Ambulatory Visit
Admission: RE | Admit: 2019-05-17 | Discharge: 2019-05-17 | Disposition: A | Discharge status: Home / Self Care | Payer: Medicare Other | Source: Ambulatory Visit | Attending: Nephrology | Admitting: Nephrology

## 2019-05-17 ENCOUNTER — Other Ambulatory Visit: Payer: Self-pay

## 2019-05-17 DIAGNOSIS — N183 Chronic kidney disease, stage 3 unspecified: Secondary | ICD-10-CM

## 2019-07-23 DIAGNOSIS — J449 Chronic obstructive pulmonary disease, unspecified: Secondary | ICD-10-CM | POA: Insufficient documentation

## 2019-08-23 ENCOUNTER — Ambulatory Visit (INDEPENDENT_AMBULATORY_CARE_PROVIDER_SITE_OTHER): Payer: Medicare Other | Admitting: Nurse Practitioner

## 2019-08-23 ENCOUNTER — Encounter (INDEPENDENT_AMBULATORY_CARE_PROVIDER_SITE_OTHER): Payer: Medicare Other

## 2019-08-27 ENCOUNTER — Other Ambulatory Visit: Payer: Self-pay

## 2019-08-27 ENCOUNTER — Ambulatory Visit (INDEPENDENT_AMBULATORY_CARE_PROVIDER_SITE_OTHER): Payer: Medicare Other

## 2019-08-27 ENCOUNTER — Ambulatory Visit (INDEPENDENT_AMBULATORY_CARE_PROVIDER_SITE_OTHER): Payer: Medicare Other | Admitting: Vascular Surgery

## 2019-08-27 ENCOUNTER — Encounter (INDEPENDENT_AMBULATORY_CARE_PROVIDER_SITE_OTHER): Payer: Self-pay | Admitting: Vascular Surgery

## 2019-08-27 VITALS — BP 175/94 | HR 96 | Resp 18 | Ht 67.0 in | Wt 217.0 lb

## 2019-08-27 DIAGNOSIS — Z794 Long term (current) use of insulin: Secondary | ICD-10-CM

## 2019-08-27 DIAGNOSIS — I739 Peripheral vascular disease, unspecified: Secondary | ICD-10-CM

## 2019-08-27 DIAGNOSIS — E1151 Type 2 diabetes mellitus with diabetic peripheral angiopathy without gangrene: Secondary | ICD-10-CM

## 2019-08-27 DIAGNOSIS — E785 Hyperlipidemia, unspecified: Secondary | ICD-10-CM | POA: Diagnosis not present

## 2019-08-27 DIAGNOSIS — I10 Essential (primary) hypertension: Secondary | ICD-10-CM

## 2019-08-27 NOTE — Progress Notes (Signed)
MRN : [id number]  Brian Pacheco is a 78 y.o. (12-21-1940) male who presents with chief complaint of  Chief Complaint  Patient presents with  . Follow-up    ultrasound  .  History of Present Illness: Patient returns today in follow up of his PAD. He is doing well without claudication that is lifestyle limiting, rest pain, or ulceration.  No new symptoms or problems since his last visit.  His noninvasive studies today demonstrate no change in his perfusion with ABIs of 0.65 on the right and 0.98 on the left.  His left leg intervention was done last December.  Current Outpatient Medications  Medication Sig Dispense Refill  . albuterol (PROVENTIL HFA;VENTOLIN HFA) 108 (90 Base) MCG/ACT inhaler Inhale 2 puffs into the lungs every 4 (four) hours as needed.     Marland Kitchen amLODipine (NORVASC) 10 MG tablet Take 10 mg by mouth daily.     Marland Kitchen aspirin EC 81 MG tablet Take 1 tablet (81 mg total) by mouth daily. (Patient taking differently: Take 81 mg by mouth every other day. ) 150 tablet 2  . budesonide-formoterol (SYMBICORT) 80-4.5 MCG/ACT inhaler Inhale 2 puffs into the lungs 2 (two) times a day. 1 Inhaler 2  . clopidogrel (PLAVIX) 75 MG tablet Take 75 mg by mouth daily.     . fenofibrate 160 MG tablet Take 160 mg by mouth daily.     Marland Kitchen glipiZIDE (GLUCOTROL) 5 MG tablet Take 5 mg by mouth 2 (two) times daily before a meal.     . insulin aspart (NOVOLOG) 100 UNIT/ML FlexPen Inject into the skin. Sliding scale    . insulin glargine (LANTUS) 100 UNIT/ML injection Inject into the skin. Sliding scale    . losartan (COZAAR) 100 MG tablet Take 100 mg by mouth daily.     . metFORMIN (GLUCOPHAGE) 500 MG tablet Take 500 mg by mouth 2 (two) times daily with a meal.     . mycophenolate (CELLCEPT) 500 MG tablet Take 500 mg by mouth 2 (two) times daily.     Marland Kitchen omeprazole (PRILOSEC) 20 MG capsule Take 20 mg by mouth daily.     . sildenafil (VIAGRA) 100 MG tablet Take by mouth.    . tamsulosin (FLOMAX) 0.4 MG CAPS capsule  Take 0.4 mg by mouth daily.     Marland Kitchen venlafaxine (EFFEXOR) 75 MG tablet Take 75 mg by mouth 3 (three) times daily.     . predniSONE (DELTASONE) 10 MG tablet Label  & dispense according to the schedule below. 5 Pills PO for 1 day then, 4 Pills PO for 1 day, 3 Pills PO for 1 day, 2 Pills PO for 1 day, 1 Pill PO for 1 days then STOP. (Patient not taking: Reported on 08/27/2019) 15 tablet 0  . rosuvastatin (CRESTOR) 5 MG tablet Take 2.5 mg by mouth at bedtime.     . traMADol (ULTRAM) 50 MG tablet Take 50 mg by mouth 2 (two) times daily.      No current facility-administered medications for this visit.     Past Medical History:  Diagnosis Date  . Asthma   . Cancer ([place])    skin cancer  . Diabetes mellitus without complication ([place])   . GERD (gastroesophageal reflux disease)   . Hyperlipidemia   . Hypertension   . 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 Peripheral vascular disease [facility])     Past Surgical History:  Procedure Laterality Date  .  CAROTID ENDARTERECTOMY    . CHOLECYSTECTOMY    . HERNIA REPAIR     umbilical incarcerated  . IR ANGIOGRAM EXTREMITY BILATERAL Bilateral    patient states he has stents in both legs  . LIVER TRANSPLANT  2003  . LOWER EXTREMITY ANGIOGRAPHY Left 10/18/2018   Procedure: LOWER EXTREMITY ANGIOGRAPHY;  Surgeon: Algernon Huxley, MD;  Location: [facility];  Service: Cardiovascular;  Laterality: Left;  . TONSILLECTOMY    . VASECTOMY  1977   Social History        Tobacco Use  . Smoking status: Former Smoker    Packs/day: 0.25    Years: 60.00    Pack years: 15.00    Last attempt to quit: 10/15/2018    Years since quitting: 0.3  . Smokeless tobacco: Never Used  . Tobacco comment: currently going to smoking cessation clinic at [facility], smoked 1 ppd until recently, down to 7 a day  Substance Use Topics  . Alcohol use: Not Currently    Comment: history of alcohol abuse quit 2003  . Drug use: Never      Family History No bleeding or clotting disorders       Allergies  Allergen Reactions  . Sitagliptin Other (See Comments)    blisters     REVIEW OF SYSTEMS (Negative unless checked)  Constitutional: [] ?Weight loss  [] ?Fever  [] ?Chills Cardiac: [] ?Chest pain   [] ?Chest pressure   [] ?Palpitations   [] ?Shortness of breath when laying flat   [] ?Shortness of breath at rest   [] ?Shortness of breath with exertion. Vascular:  [x] ?Pain in legs with walking   [] ?Pain in legs at rest   [] ?Pain in legs when laying flat   [x] ?Claudication   [] ?Pain in feet when walking  [] ?Pain in feet at rest  [] ?Pain in feet when laying flat   [] ?History of DVT   [] ?Phlebitis   [] ?Swelling in legs   [] ?Varicose veins   [] ?Non-healing ulcers Pulmonary:   [] ?Uses home oxygen   [] ?Productive cough   [] ?Hemoptysis   [] ?Wheeze  [] ?COPD   [] ?Asthma Neurologic:  [] ?Dizziness  [] ?Blackouts   [] ?Seizures   [] ?History of stroke   [] ?History of TIA  [] ?Aphasia   [] ?Temporary blindness   [] ?Dysphagia   [] ?Weakness or numbness in arms   [] ?Weakness or numbness in legs Musculoskeletal:  [x] ?Arthritis   [] ?Joint swelling   [] ?Joint pain   [] ?Low back pain Hematologic:  [] ?Easy bruising  [] ?Easy bleeding   [] ?Hypercoagulable state   [] ?Anemic   Gastrointestinal:  [] ?Blood in stool   [] ?Vomiting blood  [] ?Gastroesophageal reflux/heartburn   [] ?Abdominal pain Genitourinary:  [] ?Chronic kidney disease   [] ?Difficult urination  [] ?Frequent urination  [] ?Burning with urination   [] ?Hematuria Skin:  [] ?Rashes   [] ?Ulcers   [] ?Wounds Psychological:  [] ?History of anxiety   [] ? History of major depression.     Physical Examination  BP (!) 175/94 (BP Location: Right Arm)   Pulse 96   Resp 18   Ht 5\' 7"  (1.702 m)   Wt 217 lb (98.4 kg)   BMI 33.99 kg/m  Gen:  WD/WN, NAD Head: [place]/AT, No temporalis wasting. Ear/Nose/Throat: Hearing grossly intact, nares w/o erythema or drainage Eyes: Conjunctiva clear. Sclera  non-icteric Neck: Supple.  Trachea midline Pulmonary:  Good air movement, no use of accessory muscles.  Cardiac: RRR, no JVD Vascular:  Vessel Right Left  Radial Palpable Palpable  PT 1+ Palpable 1+ Palpable  DP 1+ Palpable Palpable    Musculoskeletal: M/S 5/5 throughout.  No deformity or atrophy. Mild LE edema. Neurologic: Sensation grossly intact in extremities.  Symmetrical.  Speech is fluent.  Psychiatric: Judgment intact, Mood & affect appropriate for pt's clinical situation. Dermatologic: No rashes or ulcers noted.  No cellulitis or open wounds.       Labs No results found for this or any previous visit (from the past 2160 hour(s)).  Radiology No results found.  Assessment/Plan Essential hypertension blood pressure control important in reducing the progression of atherosclerotic disease. On appropriate oral medications.   Diabetes ([place]) blood glucose control important in reducing the progression of atherosclerotic disease. Also, involved in wound healing. On appropriate medications.   Dyslipidemia lipid control important in reducing the progression of atherosclerotic disease. 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  His left leg intervention was done last December. He is doing well.  Continue current medical regimen and recheck in 6 months.     Leotis Pain, MD  08/27/2019 10:03 AM    This note was created with Dragon medical transcription system.  Any errors from dictation are purely unintentional

## 2019-08-27 NOTE — Assessment & Plan Note (Signed)
His noninvasive studies today demonstrate no change in his perfusion with ABIs of 0.65 on the right and 0.98 on the left.  His left leg intervention was done last December. He is doing well.  Continue current medical regimen and recheck in 6 months.

## 2019-08-27 NOTE — Patient Instructions (Signed)
Peripheral Vascular Disease  Peripheral vascular disease (PVD) is a disease of the blood vessels that are not part of your heart and brain. A simple term for PVD is poor circulation. In most cases, PVD narrows the blood vessels that carry blood from your heart to the rest of your body. This can reduce the supply of blood to your arms, legs, and internal organs, like your stomach or kidneys. However, PVD most often affects a person's lower legs and feet. Without treatment, PVD tends to get worse. PVD can also lead to acute ischemic limb. This is when an arm or leg suddenly cannot get enough blood. This is a medical emergency. Follow these instructions at home: Lifestyle  Do not use any products that contain nicotine or tobacco, such as cigarettes and e-cigarettes. If you need help quitting, ask your doctor.  Lose weight if you are overweight. Or, stay at a healthy weight as told by your doctor.  Eat a diet that is low in fat and cholesterol. If you need help, ask your doctor.  Exercise regularly. Ask your doctor for activities that are right for you. General instructions  Take over-the-counter and prescription medicines only as told by your doctor.  Take good care of your feet: ? Wear comfortable shoes that fit well. ? Check your feet often for any cuts or sores.  Keep all follow-up visits as told by your doctor This is important. Contact a doctor if:  You have cramps in your legs when you walk.  You have leg pain when you are at rest.  You have coldness in a leg or foot.  Your skin changes.  You are unable to get or have an erection (erectile dysfunction).  You have cuts or sores on your feet that do not heal. Get help right away if:  Your arm or leg turns cold, numb, and blue.  Your arms or legs become red, warm, swollen, painful, or numb.  You have chest pain.  You have trouble breathing.  You suddenly have weakness in your face, arm, or leg.  You become very  confused or you cannot speak.  You suddenly have a very bad headache.  You suddenly cannot see. Summary  Peripheral vascular disease (PVD) is a disease of the blood vessels.  A simple term for PVD is poor circulation. Without treatment, PVD tends to get worse.  Treatment may include exercise, low fat and low cholesterol diet, and quitting smoking. This information is not intended to replace advice given to you by your health care provider. Make sure you discuss any questions you have with your health care provider. Document Released: 01/11/2010 Document Revised: 09/29/2017 Document Reviewed: 11/24/2016 Elsevier Patient Education  2020 Elsevier Inc.  

## 2019-09-06 DIAGNOSIS — E875 Hyperkalemia: Secondary | ICD-10-CM | POA: Insufficient documentation

## 2019-09-06 DIAGNOSIS — R809 Proteinuria, unspecified: Secondary | ICD-10-CM | POA: Insufficient documentation

## 2019-09-06 DIAGNOSIS — I129 Hypertensive chronic kidney disease with stage 1 through stage 4 chronic kidney disease, or unspecified chronic kidney disease: Secondary | ICD-10-CM | POA: Insufficient documentation

## 2020-01-20 DIAGNOSIS — D692 Other nonthrombocytopenic purpura: Secondary | ICD-10-CM | POA: Insufficient documentation

## 2020-02-28 ENCOUNTER — Other Ambulatory Visit: Payer: Self-pay

## 2020-02-28 ENCOUNTER — Encounter (INDEPENDENT_AMBULATORY_CARE_PROVIDER_SITE_OTHER): Payer: Self-pay | Admitting: Vascular Surgery

## 2020-02-28 ENCOUNTER — Ambulatory Visit (INDEPENDENT_AMBULATORY_CARE_PROVIDER_SITE_OTHER): Payer: Medicare Other

## 2020-02-28 ENCOUNTER — Ambulatory Visit (INDEPENDENT_AMBULATORY_CARE_PROVIDER_SITE_OTHER): Payer: Medicare Other | Admitting: Vascular Surgery

## 2020-02-28 VITALS — BP 135/81 | HR 89 | Resp 16 | Wt 219.0 lb

## 2020-02-28 DIAGNOSIS — I739 Peripheral vascular disease, unspecified: Secondary | ICD-10-CM

## 2020-02-28 DIAGNOSIS — Z794 Long term (current) use of insulin: Secondary | ICD-10-CM

## 2020-02-28 DIAGNOSIS — E1151 Type 2 diabetes mellitus with diabetic peripheral angiopathy without gangrene: Secondary | ICD-10-CM | POA: Diagnosis not present

## 2020-02-28 DIAGNOSIS — I10 Essential (primary) hypertension: Secondary | ICD-10-CM

## 2020-02-28 DIAGNOSIS — E785 Hyperlipidemia, unspecified: Secondary | ICD-10-CM

## 2020-02-28 NOTE — Progress Notes (Signed)
MRN : [id number]  Roderic Lammert is a 79 y.o. ([birth year]) male who presents with chief complaint of  Chief Complaint  Patient presents with  . Follow-up    59month abi  .  History of Present Illness: Patient returns today in follow up of his peripheral arterial disease.  He underwent left lower extremity revascularization in 2019 and has done well from this.  He is tolerating his medicines without any problems currently.  Mild claudication symptoms that are not lifestyle limiting at this point in the right leg with no symptoms in the left leg. ABIs today are stable at 0.72 on the right and 1.07 on the left.  Current Outpatient Medications  Medication Sig Dispense Refill  . albuterol (PROVENTIL HFA;VENTOLIN HFA) 108 (90 Base) MCG/ACT inhaler Inhale 2 puffs into the lungs every 4 (four) hours as needed.     Marland Kitchen aspirin EC 81 MG tablet Take 1 tablet (81 mg total) by mouth daily. (Patient taking differently: Take 81 mg by mouth every other day. ) 150 tablet 2  . clopidogrel (PLAVIX) 75 MG tablet Take 75 mg by mouth daily.     . DPH-Lido-AlHydr-MgHydr-Simeth (FIRST-MOUTHWASH BLM MT) Use as directed in the mouth or throat as needed.    . fenofibrate 160 MG tablet Take 160 mg by mouth daily.     . Fluticasone-Umeclidin-Vilant (TRELEGY ELLIPTA) 100-62.5-25 MCG/INH AEPB Inhale into the lungs.    Marland Kitchen glipiZIDE (GLUCOTROL) 5 MG tablet Take 5 mg by mouth 2 (two) times daily before a meal.     . guaiFENesin (MUCINEX) 600 MG 12 hr tablet Take by mouth 2 (two) times daily.    . insulin aspart (NOVOLOG) 100 UNIT/ML FlexPen Inject into the skin. Sliding scale    . insulin glargine (LANTUS) 100 UNIT/ML injection Inject into the skin. Sliding scale    . losartan-hydrochlorothiazide (HYZAAR) 100-25 MG tablet Take by mouth.    . metFORMIN (GLUCOPHAGE) 500 MG tablet Take 500 mg by mouth 2 (two) times daily with a meal.     . mycophenolate (CELLCEPT) 500 MG tablet Take 500 mg by mouth 2 (two) times daily.     Marland Kitchen  omeprazole (PRILOSEC) 20 MG capsule Take 20 mg by mouth daily.     . sildenafil (VIAGRA) 100 MG tablet Take by mouth.    . tamsulosin (FLOMAX) 0.4 MG CAPS capsule Take 0.4 mg by mouth daily.     Marland Kitchen tiZANidine (ZANAFLEX) 4 MG tablet Take 4 mg by mouth every 6 (six) hours as needed for muscle spasms.    . traMADol (ULTRAM) 50 MG tablet Take 50 mg by mouth 2 (two) times daily.     Marland Kitchen venlafaxine (EFFEXOR) 75 MG tablet Take 75 mg by mouth 3 (three) times daily.     Marland Kitchen amLODipine (NORVASC) 10 MG tablet Take 10 mg by mouth daily.     . budesonide-formoterol (SYMBICORT) 80-4.5 MCG/ACT inhaler Inhale 2 puffs into the lungs 2 (two) times a day. (Patient not taking: Reported on 02/28/2020) 1 Inhaler 2  . losartan (COZAAR) 100 MG tablet Take 100 mg by mouth daily.     . predniSONE (DELTASONE) 10 MG tablet Label  & dispense according to the schedule below. 5 Pills PO for 1 day then, 4 Pills PO for 1 day, 3 Pills PO for 1 day, 2 Pills PO for 1 day, 1 Pill PO for 1 days then STOP. (Patient not taking: Reported on 08/27/2019) 15 tablet 0  . rosuvastatin (CRESTOR) 5 MG tablet  Take 2.5 mg by mouth at bedtime.      No current facility-administered medications for this visit.    Past Medical History:  Diagnosis Date  . Asthma   . Cancer ([place])    skin cancer  . Diabetes mellitus without complication ([place])   . GERD (gastroesophageal reflux disease)   . Hyperlipidemia   . Hypertension   . 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 Peripheral vascular disease [facility])     Past Surgical History:  Procedure Laterality Date  . CAROTID ENDARTERECTOMY    . CHOLECYSTECTOMY    . HERNIA REPAIR     umbilical incarcerated  . IR ANGIOGRAM EXTREMITY BILATERAL Bilateral    patient states he has stents in both legs  . LIVER TRANSPLANT  2003  . LOWER EXTREMITY ANGIOGRAPHY Left 10/18/2018   Procedure: LOWER EXTREMITY ANGIOGRAPHY;  Surgeon: Algernon Huxley, MD;  Location: [facility];   Service: Cardiovascular;  Laterality: Left;  . TONSILLECTOMY    . VASECTOMY  1977    Social History        Tobacco Use  . Smoking status: Former Smoker    Packs/day: 0.25    Years: 60.00    Pack years: 15.00    Last attempt to quit: 10/15/2018    Years since quitting: 0.3  . Smokeless tobacco: Never Used  . Tobacco comment: currently going to smoking cessation clinic at [facility], smoked 1 ppd until recently, down to 7 a day  Substance Use Topics  . Alcohol use: Not Currently    Comment: history of alcohol abuse quit 2003  . Drug use: Never     Family History No bleeding or clotting disorders       Allergies  Allergen Reactions  . Sitagliptin Other (See Comments)    blisters     REVIEW OF SYSTEMS(Negative unless checked)  Constitutional: [] ??Weight loss [] ??Fever [] ??Chills Cardiac: [] ??Chest pain [] ??Chest pressure [] ??Palpitations [] ??Shortness of breath when laying flat [] ??Shortness of breath at rest [] ??Shortness of breath with exertion. Vascular: [x] ??Pain in legs with walking [] ??Pain in legs at rest [] ??Pain in legs when laying flat [x] ??Claudication [] ??Pain in feet when walking [] ??Pain in feet at rest [] ??Pain in feet when laying flat [] ??History of DVT [] ??Phlebitis [] ??Swelling in legs [] ??Varicose veins [] ??Non-healing ulcers Pulmonary: [] ??Uses home oxygen [] ??Productive cough [] ??Hemoptysis [] ??Wheeze [] ??COPD [] ??Asthma Neurologic: [] ??Dizziness [] ??Blackouts [] ??Seizures [] ??History of stroke [] ??History of TIA [] ??Aphasia [] ??Temporary blindness [] ??Dysphagia [] ??Weakness or numbness in arms [] ??Weakness or numbness in legs Musculoskeletal: [x] ??Arthritis [] ??Joint swelling [] ??Joint pain [] ??Low back pain Hematologic: [] ??Easy bruising [] ??Easy bleeding [] ??Hypercoagulable state [] ??Anemic  Gastrointestinal: [] ??Blood in stool [] ??Vomiting blood  [] ??Gastroesophageal reflux/heartburn [] ??Abdominal pain Genitourinary: [] ??Chronic kidney disease [] ??Difficult urination [] ??Frequent urination [] ??Burning with urination [] ??Hematuria Skin: [] ??Rashes [] ??Ulcers [] ??Wounds Psychological: [] ??History of anxiety [] ??History of major depression.    Physical Examination  BP 135/81 (BP Location: Right Arm)   Pulse 89   Resp 16   Wt 219 lb (99.3 kg)   BMI 34.30 kg/m  Gen:  WD/WN, NAD Head: [place]/AT, No temporalis wasting. Ear/Nose/Throat: Hearing grossly intact, nares w/o erythema or drainage Eyes: Conjunctiva clear. Sclera non-icteric Neck: Supple.  Trachea midline Pulmonary:  Good air movement, no use of accessory muscles.  Cardiac: RRR, no JVD Vascular:  Vessel Right Left  Radial Palpable Palpable                          PT 1+  Palpable 2+ Palpable  DP 1+ Palpable 2+ Palpable   Gastrointestinal: soft, non-tender/non-distended. No guarding/reflex.  Musculoskeletal: M/S 5/5 throughout.  No deformity or atrophy.  No edema. Neurologic: Sensation grossly intact in extremities.  Symmetrical.  Speech is fluent.  Psychiatric: Judgment intact, Mood & affect appropriate for pt's clinical situation. Dermatologic: No rashes or ulcers noted.  No cellulitis or open wounds.       Labs No results found for this or any previous visit (from the past 2160 hour(s)).  Radiology No results found.  Assessment/Plan Essential hypertension blood pressure control important in reducing the progression of atherosclerotic disease. On appropriate oral medications.   Diabetes ([place]) blood glucose control important in reducing the progression of atherosclerotic disease. Also, involved in wound healing. On appropriate medications.   Dyslipidemia lipid control important in reducing the progression of atherosclerotic disease. Continue statin therapy. No refill needed today.  Peripheral vascular disease of extremity with  claudication ([facility]) Symptoms markedly improved after left lower extremity intervention about a year and a half ago.  ABIs today are stable at 0.72 on the right and 1.07 on the left.  No limb threatening or lifestyle limiting claudication at this point.  We will now follow this on an annual basis with noninvasive studies.  Continue current medical regimen.    Leotis Pain, MD  02/28/2020 9:48 AM    This note was created with Dragon medical transcription system.  Any errors from dictation are purely unintentional

## 2020-02-28 NOTE — Patient Instructions (Signed)
Peripheral Vascular Disease  Peripheral vascular disease (PVD) is a disease of the blood vessels that are not part of your heart and brain. A simple term for PVD is poor circulation. In most cases, PVD narrows the blood vessels that carry blood from your heart to the rest of your body. This can reduce the supply of blood to your arms, legs, and internal organs, like your stomach or kidneys. However, PVD most often affects a person's lower legs and feet. Without treatment, PVD tends to get worse. PVD can also lead to acute ischemic limb. This is when an arm or leg suddenly cannot get enough blood. This is a medical emergency. Follow these instructions at home: Lifestyle  Do not use any products that contain nicotine or tobacco, such as cigarettes and e-cigarettes. If you need help quitting, ask your doctor.  Lose weight if you are overweight. Or, stay at a healthy weight as told by your doctor.  Eat a diet that is low in fat and cholesterol. If you need help, ask your doctor.  Exercise regularly. Ask your doctor for activities that are right for you. General instructions  Take over-the-counter and prescription medicines only as told by your doctor.  Take good care of your feet: ? Wear comfortable shoes that fit well. ? Check your feet often for any cuts or sores.  Keep all follow-up visits as told by your doctor This is important. Contact a doctor if:  You have cramps in your legs when you walk.  You have leg pain when you are at rest.  You have coldness in a leg or foot.  Your skin changes.  You are unable to get or have an erection (erectile dysfunction).  You have cuts or sores on your feet that do not heal. Get help right away if:  Your arm or leg turns cold, numb, and blue.  Your arms or legs become red, warm, swollen, painful, or numb.  You have chest pain.  You have trouble breathing.  You suddenly have weakness in your face, arm, or leg.  You become very  confused or you cannot speak.  You suddenly have a very bad headache.  You suddenly cannot see. Summary  Peripheral vascular disease (PVD) is a disease of the blood vessels.  A simple term for PVD is poor circulation. Without treatment, PVD tends to get worse.  Treatment may include exercise, low fat and low cholesterol diet, and quitting smoking. This information is not intended to replace advice given to you by your health care provider. Make sure you discuss any questions you have with your health care provider. Document Revised: 09/29/2017 Document Reviewed: 11/24/2016 Elsevier Patient Education  2020 Elsevier Inc.  

## 2020-02-28 NOTE — Assessment & Plan Note (Signed)
Symptoms markedly improved after left lower extremity intervention about a year and a half ago.  ABIs today are stable at 0.72 on the right and 1.07 on the left.  No limb threatening or lifestyle limiting claudication at this point.  We will now follow this on an annual basis with noninvasive studies.  Continue current medical regimen.

## 2020-03-05 DIAGNOSIS — R3589 Other polyuria: Secondary | ICD-10-CM | POA: Insufficient documentation

## 2020-04-27 ENCOUNTER — Telehealth (INDEPENDENT_AMBULATORY_CARE_PROVIDER_SITE_OTHER): Payer: Self-pay | Admitting: Vascular Surgery

## 2020-04-27 NOTE — Telephone Encounter (Signed)
Patient was made aware with medical advice and verbalized understanding 

## 2020-04-27 NOTE — Telephone Encounter (Signed)
Called stating that his legs have been swelling for the past 2 weeks. He mentioned he has been elevating his legs when he can, but both thegs have continued to swell. He mentioned his left leg was slightly swollen at the last visit 02-28-20  ABI  Studies ; seen by JD)he would like to come in to be seen. Please advise.

## 2020-04-27 NOTE — Telephone Encounter (Signed)
Patient can be seen with bilateral venous reflux. Patient should also be advised to use medical grade one compression stockings in addition to elevation

## 2020-05-06 ENCOUNTER — Other Ambulatory Visit (INDEPENDENT_AMBULATORY_CARE_PROVIDER_SITE_OTHER): Payer: Self-pay | Admitting: Nurse Practitioner

## 2020-05-06 DIAGNOSIS — M7989 Other specified soft tissue disorders: Secondary | ICD-10-CM

## 2020-05-07 ENCOUNTER — Ambulatory Visit (INDEPENDENT_AMBULATORY_CARE_PROVIDER_SITE_OTHER): Payer: Medicare Other | Admitting: Nurse Practitioner

## 2020-05-07 ENCOUNTER — Ambulatory Visit (INDEPENDENT_AMBULATORY_CARE_PROVIDER_SITE_OTHER): Payer: Medicare Other

## 2020-05-07 ENCOUNTER — Other Ambulatory Visit: Payer: Self-pay

## 2020-05-07 ENCOUNTER — Encounter (INDEPENDENT_AMBULATORY_CARE_PROVIDER_SITE_OTHER): Payer: Self-pay | Admitting: Nurse Practitioner

## 2020-05-07 VITALS — BP 174/92 | HR 80 | Resp 16 | Wt 222.0 lb

## 2020-05-07 DIAGNOSIS — E785 Hyperlipidemia, unspecified: Secondary | ICD-10-CM | POA: Diagnosis not present

## 2020-05-07 DIAGNOSIS — I10 Essential (primary) hypertension: Secondary | ICD-10-CM | POA: Diagnosis not present

## 2020-05-07 DIAGNOSIS — M7989 Other specified soft tissue disorders: Secondary | ICD-10-CM | POA: Diagnosis not present

## 2020-05-07 DIAGNOSIS — R600 Localized edema: Secondary | ICD-10-CM | POA: Diagnosis not present

## 2020-05-07 DIAGNOSIS — I739 Peripheral vascular disease, unspecified: Secondary | ICD-10-CM | POA: Diagnosis not present

## 2020-05-07 NOTE — Progress Notes (Addendum)
Subjective:    Patient ID: Brian Pacheco, male    DOB: 03/13/1941, 79 y.o.   MRN: [id number] Chief Complaint  Patient presents with   Follow-up    ultrasound follow up    Patient is seen for evaluation of leg swelling. The patient first noticed the swelling remotely but is now concerned because of a significant increase in the overall edema. The swelling is associated with pain and discoloration. The patient notes that in the morning the legs are significantly improved but they steadily worsened throughout the course of the day. Elevation makes the legs better, dependency makes them much worse.   The patient notes that he recently started wearing compression socks and elevating his lower extremities   There is no history of ulcerations associated with the swelling.   The patient denies any recent changes in their medications.   The patient has no had any past angiography, interventions or vascular surgery.  The patient denies a history of DVT or PE. There is no prior history of phlebitis. There is no history of primary lymphedema.  There is no history of radiation treatment to the groin or pelvis No history of malignancies. No history of trauma or groin or pelvic surgery. 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  No evidence of DVT or superficial thrombophlebitis seen bilaterally.  No evidence of superficial venous reflux seen in the great or short saphenous veins bilaterally.  Review of Systems  Cardiovascular: Positive for leg swelling.  All other systems reviewed and are negative.      Objective:   Physical Exam Vitals reviewed.  HENT:     Head: Normocephalic.  Cardiovascular:     Rate and Rhythm: Normal rate and regular rhythm.     Pulses: Normal pulses.  Pulmonary:     Effort: Pulmonary effort is normal.     Breath sounds: Normal breath  sounds.  Musculoskeletal:     Right lower leg: 1+ Edema present.     Left lower leg: 1+ Edema present.  Neurological:     Mental Status: He is alert and oriented to person, place, and time.  Psychiatric:        Mood and Affect: Mood normal.        Behavior: Behavior normal.        Thought Content: Thought content normal.        Judgment: Judgment normal.     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 per pt report   Peripheral vascular disease ([place])     Social History   Socioeconomic History   Marital status: Married    Spouse name: Not on file   Number of children: Not on file   Years of education: Not on file   Highest education level: Not on file  Occupational History   Not on file  Tobacco Use   Smoking status: Former Smoker    Packs/day: 0.25  Years: 60.00    Pack years: 15.00    Quit date: 10/15/2018    Years since quitting: 1.5   Smokeless tobacco: Never Used   Tobacco comment: currently going to smoking cessation clinic at [facility], smoked 1 ppd until recently, down to 7 a day  Substance and Sexual Activity   Alcohol use: Not Currently    Comment: history of alcohol abuse quit 2003   Drug use: Never   Sexual activity: Not on file  Other Topics Concern   Not on file  Social History Narrative   Not on file   Social Determinants of Health   Financial Resource Strain:    Difficulty of Paying Living Expenses:   Food Insecurity:    Worried About Charity fundraiser in the Last Year:    Arboriculturist in the Last Year:   Transportation Needs:    Film/video editor (Medical):    Lack of Transportation  (Non-Medical):   Physical Activity:    Days of Exercise per Week:    Minutes of Exercise per Session:   Stress:    Feeling of Stress :   Social Connections:    Frequency of Communication with Friends and Family:    Frequency of Social Gatherings with Friends and Family:    Attends Religious Services:    Active Member of Clubs or Organizations:    Attends Music therapist:    Marital Status:   Intimate Partner Violence:    Fear of Current or Ex-Partner:    Emotionally Abused:    Physically Abused:    Sexually Abused:     Past Surgical History:  Procedure Laterality Date   CAROTID ENDARTERECTOMY     CHOLECYSTECTOMY     HERNIA REPAIR     umbilical incarcerated   IR ANGIOGRAM EXTREMITY BILATERAL Bilateral    patient states he has stents in both legs   LIVER TRANSPLANT  2003   LOWER EXTREMITY ANGIOGRAPHY Left 10/18/2018   Procedure: LOWER EXTREMITY ANGIOGRAPHY;  Surgeon: Algernon Huxley, MD;  Location: [place] [facility];  Service: Cardiovascular;  Laterality: Left;   TONSILLECTOMY     VASECTOMY  1977    Family History  Problem Relation Age of Onset   Heart disease Father     Allergies  Allergen Reactions   Hydralazine Rash and Shortness Of Breath   Pravastatin     Other reaction(s): Other (See Comments)   Benazepril     Other reaction(s): Other (See Comments)   Sitagliptin Other (See Comments) and Rash    blisters Other reaction(s): Other (see comments) Other reaction(s): Other (See Comments) blisters blisters blisters Other reaction(s): Other (See Comments) blisters blisters        Assessment & Plan:   1. Leg swelling I have had a long discussion with the patient regarding swelling and why it  causes symptoms.  Patient will begin wearing graduated compression stockings class 1 (20-30 mmHg) on a daily basis a prescription was given. The patient will  beginning wearing the stockings first thing in the morning and  removing them in the evening. The patient is instructed specifically not to sleep in the stockings.   In addition, behavioral modification will be initiated.  This will include frequent elevation, use of over the counter pain medications and exercise such as walking.  I have reviewed systemic causes for chronic edema such as liver, kidney and cardiac etiologies.  The patient denies problems with these organ systems.  Consideration for a lymph pump will also be made based upon the effectiveness of conservative therapy.  This would help to improve the edema control and prevent sequela such as ulcers and infections   Patient will follow up on previously established follow-up schedule   2. Dyslipidemia Continue statin as ordered and reviewed, no changes at this time   3. Peripheral arterial disease ([place]) Patient denies any worsening claudication-like symptoms.  Patient had stable ABIs at previous study in April.  The patient will continue with antiplatelet therapy as well as statins.  Patient will continue to follow-up on his 1 years schedule.  4. Essential hypertension Continue antihypertensive medications as already ordered, these medications have been reviewed and there are no changes at this time.    Current Outpatient Medications on File Prior to Visit  Medication Sig Dispense Refill   albuterol (PROVENTIL HFA;VENTOLIN HFA) 108 (90 Base) MCG/ACT inhaler Inhale 2 puffs into the lungs every 4 (four) hours as needed.      amLODipine (NORVASC) 10 MG tablet Take 10 mg by mouth daily.      aspirin EC 81 MG tablet Take 1 tablet (81 mg total) by mouth daily. (Patient taking differently: Take 81 mg by mouth every other day. ) 150 tablet 2   clopidogrel (PLAVIX) 75 MG tablet Take 75 mg by mouth daily.      DPH-Lido-AlHydr-MgHydr-Simeth (FIRST-MOUTHWASH BLM MT) Use as directed in the mouth or throat as needed.     fenofibrate 160 MG tablet Take 160 mg by mouth daily.       Fluticasone-Umeclidin-Vilant (TRELEGY ELLIPTA) 100-62.5-25 MCG/INH AEPB Inhale into the lungs.     glipiZIDE (GLUCOTROL) 5 MG tablet Take 5 mg by mouth 2 (two) times daily before a meal.      guaiFENesin (MUCINEX) 600 MG 12 hr tablet Take by mouth 2 (two) times daily.     insulin aspart (NOVOLOG) 100 UNIT/ML FlexPen Inject into the skin. Sliding scale     insulin glargine (LANTUS) 100 UNIT/ML injection Inject into the skin. Sliding scale     losartan (COZAAR) 100 MG tablet Take 100 mg by mouth daily.      losartan-hydrochlorothiazide (HYZAAR) 100-25 MG tablet Take by mouth.     metFORMIN (GLUCOPHAGE) 500 MG tablet Take 500 mg by mouth 2 (two) times daily with a meal.      mycophenolate (CELLCEPT) 500 MG tablet Take 500 mg by mouth 2 (two) times daily.      omeprazole (PRILOSEC) 20 MG capsule Take 20 mg by mouth daily.      sildenafil (VIAGRA) 100 MG tablet Take by mouth.     tamsulosin (FLOMAX) 0.4 MG CAPS capsule Take 0.4 mg by mouth daily.      tiZANidine (ZANAFLEX) 4 MG tablet Take 4 mg by mouth every 6 (six) hours as needed for muscle spasms.     traMADol (ULTRAM) 50 MG tablet Take 50 mg by mouth 2 (two) times daily.      venlafaxine (EFFEXOR) 75 MG tablet Take 75 mg by mouth 3 (three) times daily.      budesonide-formoterol (SYMBICORT) 80-4.5 MCG/ACT inhaler Inhale 2 puffs into the lungs 2 (two) times a day. (Patient not taking: Reported on 02/28/2020) 1 Inhaler 2   predniSONE (DELTASONE) 10 MG tablet Label  & dispense according to the schedule below. 5 Pills PO for 1 day then, 4 Pills PO for 1 day, 3 Pills PO for 1 day, 2 Pills PO for 1 day, 1 Pill PO for  1 days then STOP. (Patient not taking: Reported on 08/27/2019) 15 tablet 0   rosuvastatin (CRESTOR) 5 MG tablet Take 2.5 mg by mouth at bedtime.      No current facility-administered medications on file prior to visit.    There are no Patient Instructions on file for this visit. No follow-ups on file.   Kris Hartmann, NP

## 2021-03-02 ENCOUNTER — Ambulatory Visit (INDEPENDENT_AMBULATORY_CARE_PROVIDER_SITE_OTHER): Payer: Medicare Other

## 2021-03-02 ENCOUNTER — Other Ambulatory Visit: Payer: Self-pay

## 2021-03-02 ENCOUNTER — Ambulatory Visit (INDEPENDENT_AMBULATORY_CARE_PROVIDER_SITE_OTHER): Payer: Medicare Other | Admitting: Vascular Surgery

## 2021-03-02 VITALS — BP 116/69 | HR 98 | Ht 67.0 in | Wt 224.0 lb

## 2021-03-02 DIAGNOSIS — E8881 Metabolic syndrome: Secondary | ICD-10-CM | POA: Insufficient documentation

## 2021-03-02 DIAGNOSIS — F17200 Nicotine dependence, unspecified, uncomplicated: Secondary | ICD-10-CM | POA: Insufficient documentation

## 2021-03-02 DIAGNOSIS — E785 Hyperlipidemia, unspecified: Secondary | ICD-10-CM

## 2021-03-02 DIAGNOSIS — E1151 Type 2 diabetes mellitus with diabetic peripheral angiopathy without gangrene: Secondary | ICD-10-CM | POA: Diagnosis not present

## 2021-03-02 DIAGNOSIS — I739 Peripheral vascular disease, unspecified: Secondary | ICD-10-CM | POA: Diagnosis not present

## 2021-03-02 DIAGNOSIS — K7030 Alcoholic cirrhosis of liver without ascites: Secondary | ICD-10-CM | POA: Insufficient documentation

## 2021-03-02 DIAGNOSIS — E1169 Type 2 diabetes mellitus with other specified complication: Secondary | ICD-10-CM | POA: Insufficient documentation

## 2021-03-02 DIAGNOSIS — Z794 Long term (current) use of insulin: Secondary | ICD-10-CM

## 2021-03-02 DIAGNOSIS — Z87448 Personal history of other diseases of urinary system: Secondary | ICD-10-CM | POA: Insufficient documentation

## 2021-03-02 DIAGNOSIS — K219 Gastro-esophageal reflux disease without esophagitis: Secondary | ICD-10-CM | POA: Insufficient documentation

## 2021-03-02 DIAGNOSIS — K589 Irritable bowel syndrome without diarrhea: Secondary | ICD-10-CM | POA: Insufficient documentation

## 2021-03-02 DIAGNOSIS — N400 Enlarged prostate without lower urinary tract symptoms: Secondary | ICD-10-CM | POA: Insufficient documentation

## 2021-03-02 DIAGNOSIS — M1990 Unspecified osteoarthritis, unspecified site: Secondary | ICD-10-CM | POA: Insufficient documentation

## 2021-03-02 DIAGNOSIS — F1011 Alcohol abuse, in remission: Secondary | ICD-10-CM | POA: Insufficient documentation

## 2021-03-02 DIAGNOSIS — I10 Essential (primary) hypertension: Secondary | ICD-10-CM

## 2021-03-02 DIAGNOSIS — J339 Nasal polyp, unspecified: Secondary | ICD-10-CM | POA: Insufficient documentation

## 2021-03-02 DIAGNOSIS — Z760 Encounter for issue of repeat prescription: Secondary | ICD-10-CM | POA: Insufficient documentation

## 2021-03-02 NOTE — Progress Notes (Signed)
MRN : [id number]  Brian Pacheco is a 80 y.o. (01-13-1941) male who presents with chief complaint of  Chief Complaint  Patient presents with  . Follow-up    1 year  follow   .  History of Present Illness: Patient returns today in follow up of his PAD. He is 2-3 years s/p LLE revascularization for short distances claudication. He is doing well.  He is having occasional LLE swelling but nothing terrible.  His ABIs today are stable at 0.66 on the right and 0.95 on the left with digital pressures of 88 on the right and 97 on the left.  Current Outpatient Medications  Medication Sig Dispense Refill  . albuterol (PROVENTIL HFA;VENTOLIN HFA) 108 (90 Base) MCG/ACT inhaler Inhale 2 puffs into the lungs every 4 (four) hours as needed.     Marland Kitchen amLODipine (NORVASC) 10 MG tablet Take 10 mg by mouth daily.     Marland Kitchen aspirin EC 81 MG tablet Take 1 tablet (81 mg total) by mouth daily. (Patient taking differently: Take 81 mg by mouth every other day.) 150 tablet 2  . budesonide-formoterol (SYMBICORT) 80-4.5 MCG/ACT inhaler Inhale 2 puffs into the lungs 2 (two) times a day. 1 Inhaler 2  . Cholecalciferol 25 MCG (1000 UT) tablet Take 1 tablet by mouth daily.    . clopidogrel (PLAVIX) 75 MG tablet Take 75 mg by mouth daily.     . DPH-Lido-AlHydr-MgHydr-Simeth (FIRST-MOUTHWASH BLM MT) Use as directed in the mouth or throat as needed.    . fenofibrate 160 MG tablet Take 160 mg by mouth daily.     . Fluticasone-Umeclidin-Vilant (TRELEGY ELLIPTA) 100-62.5-25 MCG/INH AEPB Inhale into the lungs.    Marland Kitchen glipiZIDE (GLUCOTROL) 5 MG tablet Take 5 mg by mouth 2 (two) times daily before a meal.     . guaiFENesin (MUCINEX) 600 MG 12 hr tablet Take by mouth 2 (two) times daily.    . insulin aspart (NOVOLOG) 100 UNIT/ML FlexPen Inject into the skin. Sliding scale    . insulin glargine (LANTUS) 100 UNIT/ML injection Inject into the skin. Sliding scale    . losartan (COZAAR) 100 MG tablet Take 100 mg by mouth daily.     .  metFORMIN (GLUCOPHAGE) 500 MG tablet Take 500 mg by mouth 2 (two) times daily with a meal.     . mycophenolate (CELLCEPT) 500 MG tablet Take 500 mg by mouth 2 (two) times daily.     Marland Kitchen omeprazole (PRILOSEC) 20 MG capsule Take 20 mg by mouth daily.     . predniSONE (DELTASONE) 10 MG tablet Label  & dispense according to the schedule below. 5 Pills PO for 1 day then, 4 Pills PO for 1 day, 3 Pills PO for 1 day, 2 Pills PO for 1 day, 1 Pill PO for 1 days then STOP. (Patient taking differently: Label  & dispense according to the schedule below. 5 Pills PO for 1 day then, 4 Pills PO for 1 day, 3 Pills PO for 1 day, 2 Pills PO for 1 day, 1 Pill PO for 1 days then STOP.) 15 tablet 0  . sildenafil (VIAGRA) 100 MG tablet Take by mouth.    . tamsulosin (FLOMAX) 0.4 MG CAPS capsule Take 0.4 mg by mouth daily.     Marland Kitchen tiZANidine (ZANAFLEX) 4 MG tablet Take 4 mg by mouth every 6 (six) hours as needed for muscle spasms.    . traMADol (ULTRAM) 50 MG tablet Take 50 mg by mouth 2 (two) times daily.     Marland Kitchen  venlafaxine (EFFEXOR) 75 MG tablet Take 75 mg by mouth 3 (three) times daily.     Marland Kitchen losartan-hydrochlorothiazide (HYZAAR) 100-25 MG tablet Take by mouth.    . rosuvastatin (CRESTOR) 5 MG tablet Take 2.5 mg by mouth at bedtime.      No current facility-administered medications for this visit.    Past Medical History:  Diagnosis Date  . Asthma   . Cancer ([place])    skin cancer  . Diabetes mellitus without complication ([place])   . GERD (gastroesophageal reflux disease)   . Hyperlipidemia   . Hypertension   . 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 Peripheral vascular disease [facility])     Past Surgical History:  Procedure Laterality Date  . CAROTID ENDARTERECTOMY    . CHOLECYSTECTOMY    . HERNIA REPAIR     umbilical incarcerated  . IR ANGIOGRAM EXTREMITY BILATERAL Bilateral    patient states he has stents in both legs  . LIVER TRANSPLANT  2003  . LOWER EXTREMITY ANGIOGRAPHY  Left 10/18/2018   Procedure: LOWER EXTREMITY ANGIOGRAPHY;  Surgeon: Algernon Huxley, MD;  Location: [place] [facility];  Service: Cardiovascular;  Laterality: Left;  . TONSILLECTOMY    . VASECTOMY  1977     Social History        Tobacco Use  . Smoking status: Former Smoker    Packs/day: 0.25    Years: 60.00    Pack years: 15.00    Last attempt to quit: 10/15/2018    Years since quitting: 0.3  . Smokeless tobacco: Never Used  . Tobacco comment: currently going to smoking cessation clinic at [facility], smoked 1 ppd until recently, down to 7 a day  Substance Use Topics  . Alcohol use: Not Currently    Comment: history of alcohol abuse quit 2003  . Drug use: Never     Family History No bleeding or clotting disorders       Allergies  Allergen Reactions  . Sitagliptin Other (See Comments)    blisters     REVIEW OF SYSTEMS(Negative unless checked)  Constitutional: [] ???Weight loss [] ???Fever [] ???Chills Cardiac: [] ???Chest pain [] ???Chest pressure [] ???Palpitations [] ???Shortness of breath when laying flat [] ???Shortness of breath at rest [] ???Shortness of breath with exertion. Vascular: [x] ???Pain in legs with walking [] ???Pain in legs at rest [] ???Pain in legs when laying flat [x] ???Claudication [] ???Pain in feet when walking [] ???Pain in feet at rest [] ???Pain in feet when laying flat [] ???History of DVT [] ???Phlebitis [] ???Swelling in legs [] ???Varicose veins [] ???Non-healing ulcers Pulmonary: [] ???Uses home oxygen [] ???Productive cough [] ???Hemoptysis [] ???Wheeze [] ???COPD [] ???Asthma Neurologic: [] ???Dizziness [] ???Blackouts [] ???Seizures [] ???History of stroke [] ???History of TIA [] ???Aphasia [] ???Temporary blindness [] ???Dysphagia [] ???Weakness or numbness in arms [] ???Weakness or numbness in legs Musculoskeletal: [x] ???Arthritis [] ???Joint swelling [] ???Joint pain [] ???Low back  pain Hematologic: [] ???Easy bruising [] ???Easy bleeding [] ???Hypercoagulable state [] ???Anemic  Gastrointestinal: [] ???Blood in stool [] ???Vomiting blood [] ???Gastroesophageal reflux/heartburn [] ???Abdominal pain Genitourinary: [] ???Chronic kidney disease [] ???Difficult urination [] ???Frequent urination [] ???Burning with urination [] ???Hematuria Skin: [] ???Rashes [] ???Ulcers [] ???Wounds Psychological: [] ???History of anxiety [] ???History of major depression.     Physical Examination  BP 116/69   Pulse 98   Ht 5\' 7"  (1.702 m)   Wt 224 lb (101.6 kg)   BMI 35.08 kg/m  Gen:  WD/WN, NAD Head: [place]/AT, No temporalis wasting. Ear/Nose/Throat: Hearing grossly intact, nares w/o erythema or drainage Eyes: Conjunctiva clear. Sclera non-icteric Neck: Supple.  Trachea midline Pulmonary:  Good air movement, no use of accessory muscles.  Cardiac: RRR, no  JVD Vascular:  Vessel Right Left  Radial Palpable Palpable                          PT 1+ Palpable 1+ Palpable  DP 1+ Palpable 2+ Palpable   Gastrointestinal: soft, non-tender/non-distended. No guarding/reflex.  Musculoskeletal: M/S 5/5 throughout.  No deformity or atrophy. Trace LLE edema. Neurologic: Sensation grossly intact in extremities.  Symmetrical.  Speech is fluent.  Psychiatric: Judgment intact, Mood & affect appropriate for pt's clinical situation. Dermatologic: No rashes or ulcers noted.  No cellulitis or open wounds.       Labs No results found for this or any previous visit (from the past 2160 hour(s)).  Radiology No results found.  Assessment/Plan Essential hypertension blood pressure control important in reducing the progression of atherosclerotic disease. On appropriate oral medications.   Diabetes ([place]) blood glucose control important in reducing the progression of atherosclerotic disease. Also, involved in wound healing. On appropriate  medications.   Dyslipidemia lipid control important in reducing the progression of atherosclerotic disease. Continue statin therapy. No refill needed today.  Peripheral vascular disease of extremity with claudication ([place]) His ABIs today are stable at 0.66 on the right and 0.95 on the left with digital pressures of 88 on the right and 97 on the left.  Symptoms are quite mild currently.  No role for intervention.  Recheck in 1 year.  Continue current medical regimen.    Leotis Pain, MD  03/02/2021 10:22 AM    This note was created with Dragon medical transcription system.  Any errors from dictation are purely unintentional

## 2021-03-02 NOTE — Assessment & Plan Note (Signed)
His ABIs today are stable at 0.66 on the right and 0.95 on the left with digital pressures of 88 on the right and 97 on the left.  Symptoms are quite mild currently.  No role for intervention.  Recheck in 1 year.  Continue current medical regimen.

## 2021-07-08 ENCOUNTER — Other Ambulatory Visit (HOSPITAL_COMMUNITY): Payer: Self-pay | Admitting: Family Medicine

## 2021-07-08 ENCOUNTER — Other Ambulatory Visit: Payer: Self-pay | Admitting: Family Medicine

## 2021-07-08 DIAGNOSIS — I714 Abdominal aortic aneurysm, without rupture, unspecified: Secondary | ICD-10-CM

## 2021-07-09 ENCOUNTER — Other Ambulatory Visit: Payer: Self-pay | Admitting: Family Medicine

## 2021-07-09 ENCOUNTER — Other Ambulatory Visit (HOSPITAL_COMMUNITY): Payer: Self-pay | Admitting: Family Medicine

## 2021-07-09 DIAGNOSIS — I714 Abdominal aortic aneurysm, without rupture, unspecified: Secondary | ICD-10-CM

## 2021-07-09 DIAGNOSIS — M5416 Radiculopathy, lumbar region: Secondary | ICD-10-CM

## 2021-07-09 DIAGNOSIS — R9389 Abnormal findings on diagnostic imaging of other specified body structures: Secondary | ICD-10-CM

## 2021-07-19 ENCOUNTER — Ambulatory Visit: Payer: Medicare Other

## 2021-07-21 ENCOUNTER — Ambulatory Visit
Admission: RE | Admit: 2021-07-21 | Discharge: 2021-07-21 | Disposition: A | Discharge status: Home / Self Care | Payer: Medicare Other | Source: Ambulatory Visit | Attending: Family Medicine | Admitting: Family Medicine

## 2021-07-21 ENCOUNTER — Other Ambulatory Visit: Payer: Self-pay

## 2021-07-21 DIAGNOSIS — M5416 Radiculopathy, lumbar region: Secondary | ICD-10-CM | POA: Insufficient documentation

## 2021-07-28 ENCOUNTER — Ambulatory Visit: Admission: RE | Admit: 2021-07-28 | Payer: Medicare Other | Source: Ambulatory Visit

## 2021-08-09 ENCOUNTER — Other Ambulatory Visit: Payer: Self-pay

## 2021-08-09 ENCOUNTER — Ambulatory Visit
Admission: RE | Admit: 2021-08-09 | Discharge: 2021-08-09 | Disposition: A | Discharge status: Home / Self Care | Payer: Medicare Other | Source: Ambulatory Visit | Attending: Family Medicine | Admitting: Family Medicine

## 2021-08-09 DIAGNOSIS — R9389 Abnormal findings on diagnostic imaging of other specified body structures: Secondary | ICD-10-CM | POA: Diagnosis present

## 2021-08-09 DIAGNOSIS — I714 Abdominal aortic aneurysm, without rupture, unspecified: Secondary | ICD-10-CM | POA: Insufficient documentation

## 2022-01-27 ENCOUNTER — Emergency Department
Admission: EM | Admit: 2022-01-27 | Discharge: 2022-01-27 | Disposition: A | Discharge status: Home / Self Care | Payer: Medicare Other | Attending: Emergency Medicine | Admitting: Emergency Medicine

## 2022-01-27 ENCOUNTER — Other Ambulatory Visit: Payer: Self-pay

## 2022-01-27 ENCOUNTER — Encounter: Payer: Self-pay | Admitting: Emergency Medicine

## 2022-01-27 DIAGNOSIS — N189 Chronic kidney disease, unspecified: Secondary | ICD-10-CM | POA: Diagnosis not present

## 2022-01-27 DIAGNOSIS — Z7901 Long term (current) use of anticoagulants: Secondary | ICD-10-CM | POA: Insufficient documentation

## 2022-01-27 DIAGNOSIS — E1122 Type 2 diabetes mellitus with diabetic chronic kidney disease: Secondary | ICD-10-CM | POA: Diagnosis not present

## 2022-01-27 DIAGNOSIS — W268XXA Contact with other sharp object(s), not elsewhere classified, initial encounter: Secondary | ICD-10-CM | POA: Insufficient documentation

## 2022-01-27 DIAGNOSIS — I129 Hypertensive chronic kidney disease with stage 1 through stage 4 chronic kidney disease, or unspecified chronic kidney disease: Secondary | ICD-10-CM | POA: Diagnosis not present

## 2022-01-27 DIAGNOSIS — S61316A Laceration without foreign body of right little finger with damage to nail, initial encounter: Secondary | ICD-10-CM | POA: Insufficient documentation

## 2022-01-27 DIAGNOSIS — S6991XA Unspecified injury of right wrist, hand and finger(s), initial encounter: Secondary | ICD-10-CM | POA: Diagnosis present

## 2022-01-27 MED ORDER — CEPHALEXIN 500 MG PO CAPS: 500.0000 mg | ORAL_CAPSULE | Freq: Four times a day (QID) | ORAL | 0 refills | Status: AC

## 2022-01-27 MED ORDER — LIDOCAINE HCL (PF) 1 % IJ SOLN
20.0000 mL | Freq: Once | INTRAMUSCULAR | Status: DC
  Filled 2022-01-27: qty 20

## 2022-01-27 NOTE — ED Notes (Signed)
See triage note presents with laceration to right 5 th finger  states he cut it with a knife   ?

## 2022-01-27 NOTE — ED Triage Notes (Signed)
Pt to ED via POV for laceration to the right pinky finger. Pt states that he cut his finger around 1030-1100. Pt states that he was slicing vegetables and his hand slipped. Pt states that he does take blood thinners but is unsure which one he takes. Pt is in NAD.  ?

## 2022-01-27 NOTE — ED Provider Notes (Signed)
? ?[facility] ?Provider Note ? ? ? Event Date/Time  ? First MD Initiated Contact with Patient 01/27/22 1404   ?  (approximate) ? ? ?History  ? ?Chief Complaint ?Laceration ? ? ?HPI ? ?Brian Pacheco is a 81 y.o. male with past medical history of hypertension, hyperlipidemia, diabetes, peripheral vascular disease, CKD, and liver transplant who presents to the ED complaining of laceration.  Patient reports about 1 to 2 hours prior to arrival he was attempting to cut cucumbers with a mandolin when his hand slipped, causing the mandolin to slice his right pinky finger.  He reports cutting off the lateral portion of the tip of his pinky, was able to wash his finger at home but states he was unable to control the bleeding.  He does take Plavix for his peripheral vascular disease.  He states his last tetanus shot was in the past 5 years. ?  ? ? ?Physical Exam  ? ?Triage Vital Signs: ?ED Triage Vitals  ?Enc Vitals Group  ?   BP 01/27/22 1329 (!) 143/95  ?   Pulse Rate 01/27/22 1329 (!) 105  ?   Resp 01/27/22 1329 16  ?   Temp 01/27/22 1329 98.5 ?F (36.9 ?C)  ?   Temp Source 01/27/22 1329 Oral  ?   SpO2 01/27/22 1329 94 %  ?   Weight 01/27/22 1330 200 lb (90.7 kg)  ?   Height 01/27/22 1330 '5\' 7"'$  (1.702 m)  ?   Head Circumference --   ?   Peak Flow --   ?   Pain Score 01/27/22 1330 3  ?   Pain Loc --   ?   Pain Edu? --   ?   Excl. in [place]? --   ? ? ?Most recent vital signs: ?Vitals:  ? 01/27/22 1329  ?BP: (!) 143/95  ?Pulse: (!) 105  ?Resp: 16  ?Temp: 98.5 ?F (36.9 ?C)  ?SpO2: 94%  ? ? ?Constitutional: Alert and oriented. ?Eyes: Conjunctivae are normal. ?Head: Atraumatic. ?Nose: No congestion/rhinnorhea. ?Mouth/Throat: Mucous membranes are moist.  ?Cardiovascular: Normal rate, regular rhythm. Grossly normal heart sounds.  2+ radial pulses bilaterally. ?Respiratory: Normal respiratory effort.  No retractions. Lungs CTAB. ?Gastrointestinal: Soft and nontender. No distention. ?Musculoskeletal: No lower  extremity tenderness nor edema.  Laceration to the lateral portion of his distal right pinky finger with partial removal of the fingertip. ?Neurologic:  Normal speech and language. No gross focal neurologic deficits are appreciated. ? ? ? ?ED Results / Procedures / Treatments  ? ?Labs ?(all labs ordered are listed, but only abnormal results are displayed) ?Labs Reviewed - No data to display ? ? ?PROCEDURES: ? ?Critical Care performed: No ? ?Procedures ? ? ?MEDICATIONS ORDERED IN ED: ?Medications  ?lidocaine (PF) (XYLOCAINE) 1 % injection 20 mL (20 mLs Infiltration Not Given 01/27/22 1533)  ? ? ? ?IMPRESSION / MDM / ASSESSMENT AND PLAN / ED COURSE  ?I reviewed the triage vital signs and the nursing notes. ?             ?               ? ?81 y.o. male with past medical history of hypertension, hyperlipidemia, diabetes, peripheral vascular disease, chronic kidney disease, and liver transplant who presents to the ED complaining of laceration to the lateral portion of his distal right pinky. ? ?Differential diagnosis includes, but is not limited to, laceration, vascular injury, tendon injury, nailbed injury. ? ?Patient arrives with bulky dressing  soaked with significant amount of blood wrapped around his right pinky finger.  Upon removal, he has ongoing oozing of blood from the lateral portion of his finger, where he has removed a portion of the fingertip.  There is no evidence of vascular or tendon injury, however with the shearing laceration, I am unable to approximate the edges of the injury.  Given ongoing bleeding, hemostatic dressing was placed and we will observe patient in the ED to ensure bleeding improves. ? ?Bleeding has stopped following application of hemostatic dressing is appropriate for discharge home with PCP follow-up.  He will be started on Keflex given his immunocompromise status, was counseled to return to the ED for new worsening symptoms.  Patient agrees with plan. ? ?  ? ? ?FINAL CLINICAL  IMPRESSION(S) / ED DIAGNOSES  ? ?Final diagnoses:  ?Laceration of right little finger without foreign body with damage to nail, initial encounter  ? ? ? ?Rx / DC Orders  ? ?ED Discharge Orders   ? ?      Ordered  ?  cephALEXin (KEFLEX) 500 MG capsule  4 times daily       ? 01/27/22 1625  ? ?  ?  ? ?  ? ? ? ?Note:  This document was prepared using Dragon voice recognition software and may include unintentional dictation errors. ?  ?Blake Divine, MD ?01/27/22 1625 ? ?

## 2022-02-05 ENCOUNTER — Emergency Department: Payer: Medicare Other

## 2022-02-05 ENCOUNTER — Other Ambulatory Visit: Payer: Self-pay

## 2022-02-05 ENCOUNTER — Inpatient Hospital Stay
Admission: EM | Admit: 2022-02-05 | Discharge: 2022-02-11 | DRG: 291 | Disposition: A | Discharge status: Home Health | Payer: Medicare Other | Attending: Internal Medicine | Admitting: Internal Medicine

## 2022-02-05 DIAGNOSIS — I5043 Acute on chronic combined systolic (congestive) and diastolic (congestive) heart failure: Secondary | ICD-10-CM | POA: Insufficient documentation

## 2022-02-05 DIAGNOSIS — Z7982 Long term (current) use of aspirin: Secondary | ICD-10-CM

## 2022-02-05 DIAGNOSIS — E1151 Type 2 diabetes mellitus with diabetic peripheral angiopathy without gangrene: Secondary | ICD-10-CM | POA: Diagnosis present

## 2022-02-05 DIAGNOSIS — N1832 Chronic kidney disease, stage 3b: Secondary | ICD-10-CM | POA: Diagnosis present

## 2022-02-05 DIAGNOSIS — Z944 Liver transplant status: Secondary | ICD-10-CM

## 2022-02-05 DIAGNOSIS — Z7984 Long term (current) use of oral hypoglycemic drugs: Secondary | ICD-10-CM

## 2022-02-05 DIAGNOSIS — K219 Gastro-esophageal reflux disease without esophagitis: Secondary | ICD-10-CM | POA: Diagnosis present

## 2022-02-05 DIAGNOSIS — I5A Non-ischemic myocardial injury (non-traumatic): Secondary | ICD-10-CM

## 2022-02-05 DIAGNOSIS — D849 Immunodeficiency, unspecified: Secondary | ICD-10-CM | POA: Diagnosis present

## 2022-02-05 DIAGNOSIS — R000 Tachycardia, unspecified: Secondary | ICD-10-CM | POA: Diagnosis present

## 2022-02-05 DIAGNOSIS — M48062 Spinal stenosis, lumbar region with neurogenic claudication: Secondary | ICD-10-CM | POA: Diagnosis present

## 2022-02-05 DIAGNOSIS — J441 Chronic obstructive pulmonary disease with (acute) exacerbation: Secondary | ICD-10-CM

## 2022-02-05 DIAGNOSIS — Z85828 Personal history of other malignant neoplasm of skin: Secondary | ICD-10-CM

## 2022-02-05 DIAGNOSIS — I130 Hypertensive heart and chronic kidney disease with heart failure and stage 1 through stage 4 chronic kidney disease, or unspecified chronic kidney disease: Principal | ICD-10-CM | POA: Diagnosis present

## 2022-02-05 DIAGNOSIS — Z87891 Personal history of nicotine dependence: Secondary | ICD-10-CM

## 2022-02-05 DIAGNOSIS — Z95820 Peripheral vascular angioplasty status with implants and grafts: Secondary | ICD-10-CM

## 2022-02-05 DIAGNOSIS — I5021 Acute systolic (congestive) heart failure: Secondary | ICD-10-CM | POA: Diagnosis not present

## 2022-02-05 DIAGNOSIS — G8929 Other chronic pain: Secondary | ICD-10-CM | POA: Diagnosis present

## 2022-02-05 DIAGNOSIS — I5022 Chronic systolic (congestive) heart failure: Secondary | ICD-10-CM | POA: Diagnosis present

## 2022-02-05 DIAGNOSIS — Z794 Long term (current) use of insulin: Secondary | ICD-10-CM

## 2022-02-05 DIAGNOSIS — R739 Hyperglycemia, unspecified: Secondary | ICD-10-CM

## 2022-02-05 DIAGNOSIS — Z79899 Other long term (current) drug therapy: Secondary | ICD-10-CM

## 2022-02-05 DIAGNOSIS — R21 Rash and other nonspecific skin eruption: Secondary | ICD-10-CM | POA: Diagnosis present

## 2022-02-05 DIAGNOSIS — R609 Edema, unspecified: Secondary | ICD-10-CM

## 2022-02-05 DIAGNOSIS — Z7902 Long term (current) use of antithrombotics/antiplatelets: Secondary | ICD-10-CM

## 2022-02-05 DIAGNOSIS — N189 Chronic kidney disease, unspecified: Secondary | ICD-10-CM

## 2022-02-05 DIAGNOSIS — R7989 Other specified abnormal findings of blood chemistry: Secondary | ICD-10-CM

## 2022-02-05 DIAGNOSIS — I428 Other cardiomyopathies: Secondary | ICD-10-CM | POA: Diagnosis present

## 2022-02-05 DIAGNOSIS — I739 Peripheral vascular disease, unspecified: Secondary | ICD-10-CM | POA: Diagnosis present

## 2022-02-05 DIAGNOSIS — N179 Acute kidney failure, unspecified: Secondary | ICD-10-CM | POA: Diagnosis present

## 2022-02-05 DIAGNOSIS — J449 Chronic obstructive pulmonary disease, unspecified: Secondary | ICD-10-CM | POA: Diagnosis present

## 2022-02-05 DIAGNOSIS — R0602 Shortness of breath: Secondary | ICD-10-CM

## 2022-02-05 DIAGNOSIS — M5416 Radiculopathy, lumbar region: Secondary | ICD-10-CM | POA: Diagnosis present

## 2022-02-05 DIAGNOSIS — E1121 Type 2 diabetes mellitus with diabetic nephropathy: Secondary | ICD-10-CM

## 2022-02-05 DIAGNOSIS — E785 Hyperlipidemia, unspecified: Secondary | ICD-10-CM | POA: Diagnosis present

## 2022-02-05 DIAGNOSIS — I5023 Acute on chronic systolic (congestive) heart failure: Secondary | ICD-10-CM

## 2022-02-05 DIAGNOSIS — E1165 Type 2 diabetes mellitus with hyperglycemia: Secondary | ICD-10-CM | POA: Diagnosis present

## 2022-02-05 DIAGNOSIS — E1140 Type 2 diabetes mellitus with diabetic neuropathy, unspecified: Secondary | ICD-10-CM | POA: Diagnosis present

## 2022-02-05 DIAGNOSIS — R918 Other nonspecific abnormal finding of lung field: Secondary | ICD-10-CM

## 2022-02-05 DIAGNOSIS — F39 Unspecified mood [affective] disorder: Secondary | ICD-10-CM | POA: Diagnosis present

## 2022-02-05 DIAGNOSIS — Z8249 Family history of ischemic heart disease and other diseases of the circulatory system: Secondary | ICD-10-CM

## 2022-02-05 DIAGNOSIS — I2510 Atherosclerotic heart disease of native coronary artery without angina pectoris: Secondary | ICD-10-CM

## 2022-02-05 DIAGNOSIS — K7030 Alcoholic cirrhosis of liver without ascites: Secondary | ICD-10-CM | POA: Diagnosis present

## 2022-02-05 DIAGNOSIS — I10 Essential (primary) hypertension: Secondary | ICD-10-CM | POA: Diagnosis present

## 2022-02-05 DIAGNOSIS — Z7951 Long term (current) use of inhaled steroids: Secondary | ICD-10-CM

## 2022-02-05 DIAGNOSIS — Z20822 Contact with and (suspected) exposure to covid-19: Secondary | ICD-10-CM | POA: Diagnosis present

## 2022-02-05 DIAGNOSIS — E1122 Type 2 diabetes mellitus with diabetic chronic kidney disease: Secondary | ICD-10-CM | POA: Diagnosis present

## 2022-02-05 DIAGNOSIS — R778 Other specified abnormalities of plasma proteins: Secondary | ICD-10-CM

## 2022-02-05 DIAGNOSIS — Z888 Allergy status to other drugs, medicaments and biological substances status: Secondary | ICD-10-CM

## 2022-02-05 DIAGNOSIS — I509 Heart failure, unspecified: Principal | ICD-10-CM

## 2022-02-05 LAB — CBC WITH DIFFERENTIAL/PLATELET
Abs Immature Granulocytes: 0.13 10*3/uL — ABNORMAL HIGH (ref 0.00–0.07)
Basophils Absolute: 0.1 10*3/uL (ref 0.0–0.1)
Basophils Relative: 1 %
Eosinophils Absolute: 0.2 10*3/uL (ref 0.0–0.5)
Eosinophils Relative: 2 %
HCT: 40.1 % (ref 39.0–52.0)
Hemoglobin: 12.9 g/dL — ABNORMAL LOW (ref 13.0–17.0)
Immature Granulocytes: 1 %
Lymphocytes Relative: 34 %
Lymphs Abs: 3.1 10*3/uL (ref 0.7–4.0)
MCH: 30.3 pg (ref 26.0–34.0)
MCHC: 32.2 g/dL (ref 30.0–36.0)
MCV: 94.1 fL (ref 80.0–100.0)
Monocytes Absolute: 0.7 10*3/uL (ref 0.1–1.0)
Monocytes Relative: 7 %
Neutro Abs: 5.1 10*3/uL (ref 1.7–7.7)
Neutrophils Relative %: 55 %
Platelets: 181 10*3/uL (ref 150–400)
RBC: 4.26 MIL/uL (ref 4.22–5.81)
RDW: 12.9 % (ref 11.5–15.5)
WBC: 9.2 10*3/uL (ref 4.0–10.5)
nRBC: 0 % (ref 0.0–0.2)

## 2022-02-05 LAB — BRAIN NATRIURETIC PEPTIDE: B Natriuretic Peptide: 236.9 pg/mL — ABNORMAL HIGH (ref 0.0–100.0)

## 2022-02-05 LAB — PROCALCITONIN: Procalcitonin: <0.1 ng/mL

## 2022-02-05 LAB — COMPREHENSIVE METABOLIC PANEL
ALT: 18 U/L (ref 0–44)
AST: 24 U/L (ref 15–41)
Albumin: 3.3 g/dL — ABNORMAL LOW (ref 3.5–5.0)
Alkaline Phosphatase: 63 U/L (ref 38–126)
Anion gap: 9 (ref 5–15)
BUN: 26 mg/dL — ABNORMAL HIGH (ref 8–23)
CO2: 29 mmol/L (ref 22–32)
Calcium: 8.4 mg/dL — ABNORMAL LOW (ref 8.9–10.3)
Chloride: 103 mmol/L (ref 98–111)
Creatinine, Ser: 1.63 mg/dL — ABNORMAL HIGH (ref 0.61–1.24)
GFR, Estimated: 43 mL/min — ABNORMAL LOW (ref >60)
Glucose, Bld: 210 mg/dL — ABNORMAL HIGH (ref 70–99)
Potassium: 3.9 mmol/L (ref 3.5–5.1)
Sodium: 141 mmol/L (ref 135–145)
Total Bilirubin: 0.5 mg/dL (ref 0.3–1.2)
Total Protein: 6.7 g/dL (ref 6.5–8.1)

## 2022-02-05 LAB — RESP PANEL BY RT-PCR (FLU A&B, COVID) ARPGX2
Influenza A by PCR: NEGATIVE
Influenza B by PCR: NEGATIVE
SARS Coronavirus 2 by RT PCR: NEGATIVE

## 2022-02-05 LAB — TROPONIN I (HIGH SENSITIVITY): Troponin I (High Sensitivity): 88 ng/L — ABNORMAL HIGH (ref <18)

## 2022-02-05 LAB — D-DIMER, QUANTITATIVE: D-Dimer, Quant: 0.82 ug/mL-FEU — ABNORMAL HIGH (ref 0.00–0.50)

## 2022-02-05 MED ORDER — IPRATROPIUM-ALBUTEROL 0.5-2.5 (3) MG/3ML IN SOLN
9.0000 mL | Freq: Once | RESPIRATORY_TRACT | Status: AC
  Administered 2022-02-05: 9 mL via RESPIRATORY_TRACT
  Filled 2022-02-05: qty 3

## 2022-02-05 MED ORDER — FUROSEMIDE 10 MG/ML IJ SOLN
40.0000 mg | Freq: Once | INTRAMUSCULAR | Status: AC
Start: 2022-02-06 — End: 2022-02-06
  Administered 2022-02-06: 40 mg via INTRAVENOUS
  Filled 2022-02-05: qty 4

## 2022-02-05 MED ORDER — IOHEXOL 350 MG/ML SOLN
50.0000 mL | Freq: Once | INTRAVENOUS | Status: AC | PRN
  Administered 2022-02-05: 50 mL via INTRAVENOUS

## 2022-02-05 MED ORDER — ASPIRIN 81 MG PO CHEW
324.0000 mg | CHEWABLE_TABLET | Freq: Once | ORAL | Status: AC
  Administered 2022-02-05: 324 mg via ORAL
  Filled 2022-02-05: qty 4

## 2022-02-05 MED ORDER — IPRATROPIUM-ALBUTEROL 0.5-2.5 (3) MG/3ML IN SOLN
RESPIRATORY_TRACT | Status: AC
  Filled 2022-02-05: qty 6

## 2022-02-05 NOTE — ED Triage Notes (Signed)
Pt arrives via [facility] from home with CC of SOB that started today. Pt used rescue inhaler without relief. EMS gave 125 of SoluMedrol and two nebulizer treatments. ?

## 2022-02-05 NOTE — ED Provider Notes (Addendum)
? ?[facility] ?Provider Note ? ? ? Event Date/Time  ? First MD Initiated Contact with Patient 02/05/22 2145   ?  (approximate) ? ? ?History  ? ?Shortness of Breath ? ? ?HPI ? ?Brian Pacheco is a 81 y.o. 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  Patient has also had a little chest tightness associate with this.  No fevers, hemoptysis, abdominal pain, nausea, vomiting, diarrhea, headache, earache, sore throat, rash or extremity pain.  No recent falls or injuries.  States he has not smoked in years.  He reportedly was wheezing with EMS who gave him 2 DuoNebs and Solu-Medrol.  States he is feeling a little better now but still has some shortness of breath.  He denies any history of heart failure although does endorse orthopnea.  He also feels that his legs are more swollen. ? ?  ?Past Medical History:  ?Diagnosis Date  ? Asthma   ? Cancer [facility])   ? skin cancer  ? Diabetes mellitus without complication ([place])   ? GERD (gastroesophageal reflux disease)   ? Hyperlipidemia   ? Hypertension   ? 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 liver replacement per pt report  ? Peripheral vascular disease ([place])   ? ? ? ?Physical Exam  ?Triage Vital Signs: ?ED Triage Vitals  ?Enc Vitals Group  ?   BP   ?   Pulse   ?   Resp   ?   Temp   ?   Temp src   ?   SpO2   ?   Weight   ?   Height   ?   Head Circumference   ?   Peak Flow   ?   Pain Score   ?   Pain Loc   ?   Pain Edu?   ?   Excl. in [place]?   ? ? ?Most recent vital signs: ?Vitals:  ? 02/05/22 2300 02/05/22 2335  ?BP: (!) 142/86 122/82  ?Pulse: 97 (!) 103  ?Resp: 18 (!) 23  ?Temp:    ?SpO2: 96% 94%  ? ? ?General: Awake, no distress.  ?CV:  Good peripheral perfusion.  2+ radial pulses. ?Resp:  Normal effort.  Bilateral end expiratory  wheezing. ?Abd:  No distention.  Soft. ?Other:  Mild lower extremity edema. ? ? ?ED Results / Procedures / Treatments  ?Labs ?(all labs ordered are listed, but only abnormal results are displayed) ?Labs Reviewed  ?CBC WITH DIFFERENTIAL/PLATELET - Abnormal; Notable for the following components:  ?    Result Value  ? Hemoglobin 12.9 (*)   ? Abs Immature Granulocytes 0.13 (*)   ? All other components within normal limits  ?COMPREHENSIVE METABOLIC PANEL - Abnormal; Notable for the following components:  ? Glucose, Bld 210 (*)   ? BUN 26 (*)   ? Creatinine, Ser 1.63 (*)   ? Calcium 8.4 (*)   ? Albumin 3.3 (*)   ? GFR, Estimated 43 (*)   ? All other components within normal limits  ?BRAIN NATRIURETIC PEPTIDE - Abnormal; Notable for the following components:  ? B Natriuretic Peptide 236.9 (*)   ? All other components within normal limits  ?D-DIMER, QUANTITATIVE - Abnormal; Notable for the following components:  ? D-Dimer, [place]  0.82 (*)   ? All other components within normal limits  ?TROPONIN I (HIGH SENSITIVITY) - Abnormal; Notable for the following components:  ? Troponin I (High Sensitivity) 88 (*)   ? All other components within normal limits  ?RESP PANEL BY RT-PCR (FLU A&B, COVID) ARPGX2  ?PROCALCITONIN  ?PROTIME-INR  ? ? ? ?EKG ? ?ECG is remarkable for sinus tachycardia with a ventricular rate of 105, nonspecific ST change in inferior and lateral leads without other clear evidence of acute ischemia.  There is Q-wave in V2 representing possible old anterior infarct. ? ? ?RADIOLOGY ?Chest x-ray my interpretation shows what appears to be either bilateral pulmonary edema versus multifocal opacities with small bilateral pleural effusions and no pneumothorax.  I also reviewed radiologist interpretation. ? ?CTA chest reviewed myself without evidence of PE or large focal consolidation.  There is interstitial edema and small bilateral pleural effusions.  Also viewed radiology interpretation and agree with their findings of  edema and possibly an infiltrate in the right middle lobe although not clearly infectious as well as bilateral small pleural effusions, hilar lymphadenopathy, moderate severity CAD, and several noncalcified lung nodules. ? ?PROCEDURES: ? ?Critical Care performed: Yes, see critical care procedure note(s) ? ?.1-3 Lead EKG Interpretation ?Performed by: Lucrezia Starch, MD ?Authorized by: Lucrezia Starch, MD  ? ?  Interpretation: non-specific   ?  ECG rate assessment: tachycardic   ?  Rhythm: sinus rhythm   ?  Ectopy: none   ?  Conduction: normal   ?.Critical Care ?Performed by: Lucrezia Starch, MD ?Authorized by: Lucrezia Starch, MD  ? ?Critical care provider statement:  ?  Critical care time (minutes):  30 ?  Critical care was necessary to treat or prevent imminent or life-threatening deterioration of the following conditions:  Respiratory failure and circulatory failure ?  Critical care was time spent personally by me on the following activities:  Development of treatment plan with patient or surrogate, discussions with consultants, evaluation of patient's response to treatment, examination of patient, ordering and review of laboratory studies, ordering and review of radiographic studies, ordering and performing treatments and interventions, pulse oximetry, re-evaluation of patient's condition and review of old charts ? ?The patient is on the cardiac monitor to evaluate for evidence of arrhythmia and/or significant heart rate changes. ? ? ?MEDICATIONS ORDERED IN ED: ?Medications  ?furosemide (LASIX) injection 40 mg (has no administration in time range)  ?ipratropium-albuterol (DUONEB) 0.5-2.5 (3) MG/3ML nebulizer solution 9 mL ( Nebulization See Procedure Record 02/05/22 2255)  ?iohexol (OMNIPAQUE) 350 MG/ML injection 50 mL (50 mLs Intravenous Contrast Given 02/05/22 2316)  ?aspirin chewable tablet 324 mg (324 mg Oral Given 02/05/22 2334)  ? ? ? ?IMPRESSION / MDM / ASSESSMENT AND PLAN / ED COURSE  ?I reviewed the  triage vital signs and the nursing notes. ?             ?               ? ?Differential diagnosis includes, but is not limited to COPD/asthma exacerbation, pneumonia, pneumothorax, PE, ACS, arrhythmia, pericarditis, myocarditis, anemia and metabolic derangements. ? ?ECG is remarkable for sinus tachycardia with a ventricular rate of 105, nonspecific ST change in inferior and lateral leads without other clear evidence of acute ischemia.  There is Q-wave in V2 representing possible old anterior infarct.  Initial troponin is 88 which I am concerned represents some demand ischemia from the cannot completely exclude an occlusion MI.  We will start on  ASA and hold heparin pending repeat troponin as patient has no active chest pain at this time. ? ?Chest x-ray my interpretation shows what appears to be either bilateral pulmonary edema versus multifocal opacities with small bilateral pleural effusions and no pneumothorax.  I also reviewed radiologist interpretation.  COVID influenza PCR is negative. ? ?CMP is remarkable for glucose of 210 and a creatinine of 1.63 compared to 1.86 months ago.  No other clear acute electrolyte or metabolic derangements.  BNP slightly elevated to 36 supporting findings on x-ray and exam for some mild volume overload. ? ?D-dimer elevated 0.82 and thus a CTA was obtained to rule out a PE and further assess for edema on lungs. ? ?CTA chest reviewed myself without evidence of PE or large focal consolidation.  There is interstitial edema and small bilateral pleural effusions.  Also viewed radiology interpretation and agree with their findings of edema and possibly an infiltrate in the right middle lobe although not clearly infectious as well as bilateral small pleural effusions, hilar lymphadenopathy, moderate severity CAD, and several noncalcified lung nodules. ? ?I will give 40 of IV Lasix with concerns for acute CHF contributing to shortness of breath and wheezing. ? ?Care patient signed over to  assuming father at approximately 2350.  Plan is to follow-up repeat troponin and if trending up start heparin and admit to medicine service for further evaluation and management. ? ?  ? ? ?FINAL CLINICAL IMPRESSION(S) /

## 2022-02-06 ENCOUNTER — Inpatient Hospital Stay: Payer: Medicare Other

## 2022-02-06 ENCOUNTER — Inpatient Hospital Stay
Admit: 2022-02-06 | Discharge: 2022-02-06 | Disposition: A | Discharge status: Home / Self Care | Payer: Medicare Other | Attending: Internal Medicine | Admitting: Internal Medicine

## 2022-02-06 DIAGNOSIS — I5043 Acute on chronic combined systolic (congestive) and diastolic (congestive) heart failure: Secondary | ICD-10-CM | POA: Insufficient documentation

## 2022-02-06 DIAGNOSIS — F39 Unspecified mood [affective] disorder: Secondary | ICD-10-CM | POA: Diagnosis present

## 2022-02-06 DIAGNOSIS — I130 Hypertensive heart and chronic kidney disease with heart failure and stage 1 through stage 4 chronic kidney disease, or unspecified chronic kidney disease: Secondary | ICD-10-CM | POA: Diagnosis present

## 2022-02-06 DIAGNOSIS — R778 Other specified abnormalities of plasma proteins: Secondary | ICD-10-CM

## 2022-02-06 DIAGNOSIS — J449 Chronic obstructive pulmonary disease, unspecified: Secondary | ICD-10-CM | POA: Diagnosis not present

## 2022-02-06 DIAGNOSIS — E1151 Type 2 diabetes mellitus with diabetic peripheral angiopathy without gangrene: Secondary | ICD-10-CM | POA: Diagnosis present

## 2022-02-06 DIAGNOSIS — I5023 Acute on chronic systolic (congestive) heart failure: Secondary | ICD-10-CM | POA: Diagnosis present

## 2022-02-06 DIAGNOSIS — Z944 Liver transplant status: Secondary | ICD-10-CM | POA: Diagnosis not present

## 2022-02-06 DIAGNOSIS — Z20822 Contact with and (suspected) exposure to covid-19: Secondary | ICD-10-CM | POA: Diagnosis present

## 2022-02-06 DIAGNOSIS — Z8249 Family history of ischemic heart disease and other diseases of the circulatory system: Secondary | ICD-10-CM | POA: Diagnosis not present

## 2022-02-06 DIAGNOSIS — N179 Acute kidney failure, unspecified: Secondary | ICD-10-CM | POA: Diagnosis present

## 2022-02-06 DIAGNOSIS — Z85828 Personal history of other malignant neoplasm of skin: Secondary | ICD-10-CM | POA: Diagnosis not present

## 2022-02-06 DIAGNOSIS — Z95820 Peripheral vascular angioplasty status with implants and grafts: Secondary | ICD-10-CM | POA: Diagnosis not present

## 2022-02-06 DIAGNOSIS — I428 Other cardiomyopathies: Secondary | ICD-10-CM | POA: Diagnosis present

## 2022-02-06 DIAGNOSIS — I5A Non-ischemic myocardial injury (non-traumatic): Secondary | ICD-10-CM | POA: Diagnosis present

## 2022-02-06 DIAGNOSIS — R739 Hyperglycemia, unspecified: Secondary | ICD-10-CM | POA: Diagnosis not present

## 2022-02-06 DIAGNOSIS — R062 Wheezing: Secondary | ICD-10-CM | POA: Diagnosis not present

## 2022-02-06 DIAGNOSIS — G8929 Other chronic pain: Secondary | ICD-10-CM | POA: Diagnosis present

## 2022-02-06 DIAGNOSIS — K7030 Alcoholic cirrhosis of liver without ascites: Secondary | ICD-10-CM | POA: Diagnosis present

## 2022-02-06 DIAGNOSIS — R7989 Other specified abnormal findings of blood chemistry: Secondary | ICD-10-CM | POA: Diagnosis present

## 2022-02-06 DIAGNOSIS — E1165 Type 2 diabetes mellitus with hyperglycemia: Secondary | ICD-10-CM | POA: Diagnosis present

## 2022-02-06 DIAGNOSIS — I509 Heart failure, unspecified: Secondary | ICD-10-CM

## 2022-02-06 DIAGNOSIS — I5022 Chronic systolic (congestive) heart failure: Secondary | ICD-10-CM | POA: Diagnosis present

## 2022-02-06 DIAGNOSIS — E785 Hyperlipidemia, unspecified: Secondary | ICD-10-CM | POA: Diagnosis present

## 2022-02-06 DIAGNOSIS — N189 Chronic kidney disease, unspecified: Secondary | ICD-10-CM | POA: Diagnosis not present

## 2022-02-06 DIAGNOSIS — I2510 Atherosclerotic heart disease of native coronary artery without angina pectoris: Secondary | ICD-10-CM | POA: Diagnosis present

## 2022-02-06 DIAGNOSIS — Z87891 Personal history of nicotine dependence: Secondary | ICD-10-CM | POA: Diagnosis not present

## 2022-02-06 DIAGNOSIS — E1121 Type 2 diabetes mellitus with diabetic nephropathy: Secondary | ICD-10-CM | POA: Diagnosis not present

## 2022-02-06 DIAGNOSIS — E1122 Type 2 diabetes mellitus with diabetic chronic kidney disease: Secondary | ICD-10-CM | POA: Diagnosis present

## 2022-02-06 DIAGNOSIS — J441 Chronic obstructive pulmonary disease with (acute) exacerbation: Secondary | ICD-10-CM | POA: Diagnosis present

## 2022-02-06 DIAGNOSIS — E1140 Type 2 diabetes mellitus with diabetic neuropathy, unspecified: Secondary | ICD-10-CM | POA: Diagnosis present

## 2022-02-06 DIAGNOSIS — N1832 Chronic kidney disease, stage 3b: Secondary | ICD-10-CM | POA: Diagnosis present

## 2022-02-06 DIAGNOSIS — I5021 Acute systolic (congestive) heart failure: Secondary | ICD-10-CM | POA: Diagnosis present

## 2022-02-06 LAB — CBC
HCT: 39.5 % (ref 39.0–52.0)
Hemoglobin: 12.7 g/dL — ABNORMAL LOW (ref 13.0–17.0)
MCH: 30.2 pg (ref 26.0–34.0)
MCHC: 32.2 g/dL (ref 30.0–36.0)
MCV: 93.8 fL (ref 80.0–100.0)
Platelets: 184 10*3/uL (ref 150–400)
RBC: 4.21 MIL/uL — ABNORMAL LOW (ref 4.22–5.81)
RDW: 13.1 % (ref 11.5–15.5)
WBC: 7.9 10*3/uL (ref 4.0–10.5)
nRBC: 0 % (ref 0.0–0.2)

## 2022-02-06 LAB — LIPID PANEL
Cholesterol: 219 mg/dL — ABNORMAL HIGH (ref 0–200)
HDL: 40 mg/dL — ABNORMAL LOW (ref >40)
LDL Cholesterol: 152 mg/dL — ABNORMAL HIGH (ref 0–99)
Total CHOL/HDL Ratio: 5.5 RATIO
Triglycerides: 134 mg/dL (ref <150)
VLDL: 27 mg/dL (ref 0–40)

## 2022-02-06 LAB — COMPREHENSIVE METABOLIC PANEL
ALT: 19 U/L (ref 0–44)
AST: 39 U/L (ref 15–41)
Albumin: 3.5 g/dL (ref 3.5–5.0)
Alkaline Phosphatase: 55 U/L (ref 38–126)
Anion gap: 9 (ref 5–15)
BUN: 27 mg/dL — ABNORMAL HIGH (ref 8–23)
CO2: 28 mmol/L (ref 22–32)
Calcium: 8.8 mg/dL — ABNORMAL LOW (ref 8.9–10.3)
Chloride: 99 mmol/L (ref 98–111)
Creatinine, Ser: 1.85 mg/dL — ABNORMAL HIGH (ref 0.61–1.24)
GFR, Estimated: 37 mL/min — ABNORMAL LOW (ref >60)
Glucose, Bld: 323 mg/dL — ABNORMAL HIGH (ref 70–99)
Potassium: 4.6 mmol/L (ref 3.5–5.1)
Sodium: 136 mmol/L (ref 135–145)
Total Bilirubin: 0.6 mg/dL (ref 0.3–1.2)
Total Protein: 7.2 g/dL (ref 6.5–8.1)

## 2022-02-06 LAB — HEMOGLOBIN A1C
Hgb A1c MFr Bld: 7.8 % — ABNORMAL HIGH (ref 4.8–5.6)
Mean Plasma Glucose: 177.16 mg/dL

## 2022-02-06 LAB — PROTIME-INR
INR: 0.9 (ref 0.8–1.2)
Prothrombin Time: 12.5 seconds (ref 11.4–15.2)

## 2022-02-06 LAB — ECHOCARDIOGRAM COMPLETE
AR max vel: 2.79 cm2
AV Peak grad: 3.8 mmHg
Ao pk vel: 0.98 m/s
Area-P 1/2: 5.97 cm2
Calc EF: 37.8 %
Height: 67 in
S' Lateral: 3.51 cm
Single Plane A2C EF: 32.5 %
Single Plane A4C EF: 43.1 %
Weight: 3200 oz

## 2022-02-06 LAB — GLUCOSE, CAPILLARY
Glucose-Capillary: 351 mg/dL — ABNORMAL HIGH (ref 70–99)
Glucose-Capillary: 54 mg/dL — ABNORMAL LOW (ref 70–99)
Glucose-Capillary: 120 mg/dL — ABNORMAL HIGH (ref 70–99)
Glucose-Capillary: 204 mg/dL — ABNORMAL HIGH (ref 70–99)

## 2022-02-06 LAB — TROPONIN I (HIGH SENSITIVITY)
Troponin I (High Sensitivity): 109 ng/L (ref <18)
Troponin I (High Sensitivity): 137 ng/L (ref <18)
Troponin I (High Sensitivity): 143 ng/L (ref <18)

## 2022-02-06 LAB — PHOSPHORUS: Phosphorus: 2.5 mg/dL (ref 2.5–4.6)

## 2022-02-06 LAB — CBG MONITORING, ED: Glucose-Capillary: 433 mg/dL — ABNORMAL HIGH (ref 70–99)

## 2022-02-06 LAB — MAGNESIUM: Magnesium: 1.3 mg/dL — ABNORMAL LOW (ref 1.7–2.4)

## 2022-02-06 LAB — TSH: TSH: 0.524 u[IU]/mL (ref 0.350–4.500)

## 2022-02-06 MED ORDER — FLUTICASONE FUROATE-VILANTEROL 100-25 MCG/ACT IN AEPB
1.0000 | INHALATION_SPRAY | Freq: Every day | RESPIRATORY_TRACT | Status: DC
  Administered 2022-02-07 – 2022-02-11 (×5): 1 via RESPIRATORY_TRACT
  Filled 2022-02-06: qty 28

## 2022-02-06 MED ORDER — INSULIN GLARGINE-YFGN 100 UNIT/ML ~~LOC~~ SOLN
30.0000 [IU] | Freq: Two times a day (BID) | SUBCUTANEOUS | Status: DC
  Administered 2022-02-06: 30 [IU] via SUBCUTANEOUS
  Filled 2022-02-06 (×2): qty 0.3

## 2022-02-06 MED ORDER — INSULIN GLARGINE-YFGN 100 UNIT/ML ~~LOC~~ SOLN: 20.0000 [IU] | Freq: Every day | SUBCUTANEOUS | Status: DC

## 2022-02-06 MED ORDER — INSULIN ASPART 100 UNIT/ML IJ SOLN
0.0000 [IU] | Freq: Three times a day (TID) | INTRAMUSCULAR | Status: DC
  Administered 2022-02-06: 15 [IU] via SUBCUTANEOUS
  Filled 2022-02-06: qty 1

## 2022-02-06 MED ORDER — INSULIN ASPART 100 UNIT/ML IJ SOLN
0.0000 [IU] | Freq: Three times a day (TID) | INTRAMUSCULAR | Status: DC
  Administered 2022-02-06: 20 [IU] via SUBCUTANEOUS
  Administered 2022-02-07: 7 [IU] via SUBCUTANEOUS
  Administered 2022-02-07: 4 [IU] via SUBCUTANEOUS
  Administered 2022-02-07: 3 [IU] via SUBCUTANEOUS
  Administered 2022-02-08: 7 [IU] via SUBCUTANEOUS
  Administered 2022-02-08: 4 [IU] via SUBCUTANEOUS
  Administered 2022-02-09: 7 [IU] via SUBCUTANEOUS
  Administered 2022-02-09 – 2022-02-10 (×3): 4 [IU] via SUBCUTANEOUS
  Administered 2022-02-10 – 2022-02-11 (×2): 7 [IU] via SUBCUTANEOUS
  Filled 2022-02-06 (×12): qty 1

## 2022-02-06 MED ORDER — FENOFIBRATE 160 MG PO TABS
160.0000 mg | ORAL_TABLET | Freq: Every day | ORAL | Status: DC
  Administered 2022-02-06 – 2022-02-11 (×6): 160 mg via ORAL
  Filled 2022-02-06 (×6): qty 1

## 2022-02-06 MED ORDER — ONDANSETRON HCL 4 MG/2ML IJ SOLN: 4.0000 mg | Freq: Four times a day (QID) | INTRAMUSCULAR | Status: DC | PRN

## 2022-02-06 MED ORDER — MYCOPHENOLATE MOFETIL 250 MG PO CAPS
250.0000 mg | ORAL_CAPSULE | Freq: Two times a day (BID) | ORAL | Status: DC
  Administered 2022-02-06 – 2022-02-11 (×11): 250 mg via ORAL
  Filled 2022-02-06 (×11): qty 1

## 2022-02-06 MED ORDER — ENOXAPARIN SODIUM 60 MG/0.6ML IJ SOSY
0.5000 mg/kg | PREFILLED_SYRINGE | INTRAMUSCULAR | Status: DC
  Administered 2022-02-06 – 2022-02-08 (×3): 45 mg via SUBCUTANEOUS
  Filled 2022-02-06 (×3): qty 0.6

## 2022-02-06 MED ORDER — MAGNESIUM SULFATE 4 GM/100ML IV SOLN
4.0000 g | INTRAVENOUS | Status: AC
  Administered 2022-02-06: 4 g via INTRAVENOUS
  Filled 2022-02-06: qty 100

## 2022-02-06 MED ORDER — ALBUTEROL SULFATE (2.5 MG/3ML) 0.083% IN NEBU
2.5000 mg | INHALATION_SOLUTION | RESPIRATORY_TRACT | Status: DC | PRN
  Administered 2022-02-06: 2.5 mg via RESPIRATORY_TRACT
  Filled 2022-02-06: qty 3

## 2022-02-06 MED ORDER — VENLAFAXINE HCL 37.5 MG PO TABS
75.0000 mg | ORAL_TABLET | Freq: Three times a day (TID) | ORAL | Status: DC
  Administered 2022-02-06 – 2022-02-11 (×16): 75 mg via ORAL
  Filled 2022-02-06 (×16): qty 2

## 2022-02-06 MED ORDER — INSULIN ASPART 100 UNIT/ML IJ SOLN
6.0000 [IU] | Freq: Three times a day (TID) | INTRAMUSCULAR | Status: DC
  Administered 2022-02-06: 6 [IU] via SUBCUTANEOUS
  Filled 2022-02-06: qty 1

## 2022-02-06 MED ORDER — BISOPROLOL FUMARATE 5 MG PO TABS
5.0000 mg | ORAL_TABLET | Freq: Every day | ORAL | Status: DC
  Administered 2022-02-06 – 2022-02-11 (×6): 5 mg via ORAL
  Filled 2022-02-06 (×7): qty 1

## 2022-02-06 MED ORDER — PANTOPRAZOLE SODIUM 40 MG PO TBEC
40.0000 mg | DELAYED_RELEASE_TABLET | Freq: Every day | ORAL | Status: DC
Start: 2022-02-06 — End: 2022-02-11
  Administered 2022-02-06 – 2022-02-11 (×6): 40 mg via ORAL
  Filled 2022-02-06 (×6): qty 1

## 2022-02-06 MED ORDER — HYDROCORTISONE 0.5 % EX CREA
TOPICAL_CREAM | CUTANEOUS | Status: DC | PRN
  Administered 2022-02-07: 1 via TOPICAL
  Filled 2022-02-06: qty 28.35

## 2022-02-06 MED ORDER — PHENOL 1.4 % MT LIQD
1.0000 | OROMUCOSAL | Status: DC | PRN
  Administered 2022-02-06: 1 via OROMUCOSAL
  Filled 2022-02-06: qty 177

## 2022-02-06 MED ORDER — IPRATROPIUM-ALBUTEROL 0.5-2.5 (3) MG/3ML IN SOLN
3.0000 mL | Freq: Four times a day (QID) | RESPIRATORY_TRACT | Status: DC
  Administered 2022-02-06 (×3): 3 mL via RESPIRATORY_TRACT
  Filled 2022-02-06 (×3): qty 3

## 2022-02-06 MED ORDER — POTASSIUM CHLORIDE CRYS ER 20 MEQ PO TBCR
40.0000 meq | EXTENDED_RELEASE_TABLET | Freq: Every day | ORAL | Status: AC
  Administered 2022-02-06 – 2022-02-07 (×2): 40 meq via ORAL
  Filled 2022-02-06 (×2): qty 2

## 2022-02-06 MED ORDER — FUROSEMIDE 10 MG/ML IJ SOLN
40.0000 mg | Freq: Two times a day (BID) | INTRAMUSCULAR | Status: DC
  Administered 2022-02-06: 40 mg via INTRAVENOUS
  Filled 2022-02-06: qty 4

## 2022-02-06 MED ORDER — INSULIN GLARGINE-YFGN 100 UNIT/ML ~~LOC~~ SOLN
25.0000 [IU] | Freq: Every day | SUBCUTANEOUS | Status: DC
  Administered 2022-02-07 – 2022-02-11 (×4): 25 [IU] via SUBCUTANEOUS
  Filled 2022-02-06 (×5): qty 0.25

## 2022-02-06 MED ORDER — POLYETHYLENE GLYCOL 3350 17 G PO PACK: 17.0000 g | PACK | Freq: Every day | ORAL | Status: DC | PRN

## 2022-02-06 MED ORDER — INSULIN ASPART 100 UNIT/ML IJ SOLN: 0.0000 [IU] | Freq: Every day | INTRAMUSCULAR | Status: DC

## 2022-02-06 MED ORDER — MELATONIN 5 MG PO TABS: 2.5000 mg | ORAL_TABLET | Freq: Every evening | ORAL | Status: DC | PRN

## 2022-02-06 MED ORDER — TAMSULOSIN HCL 0.4 MG PO CAPS
0.4000 mg | ORAL_CAPSULE | Freq: Every day | ORAL | Status: DC
  Administered 2022-02-06 – 2022-02-11 (×6): 0.4 mg via ORAL
  Filled 2022-02-06 (×6): qty 1

## 2022-02-06 MED ORDER — UMECLIDINIUM BROMIDE 62.5 MCG/ACT IN AEPB
1.0000 | INHALATION_SPRAY | Freq: Every day | RESPIRATORY_TRACT | Status: DC
Start: 2022-02-06 — End: 2022-02-11
  Administered 2022-02-07 – 2022-02-11 (×5): 1 via RESPIRATORY_TRACT
  Filled 2022-02-06: qty 7

## 2022-02-06 MED ORDER — CLOPIDOGREL BISULFATE 75 MG PO TABS
75.0000 mg | ORAL_TABLET | Freq: Every day | ORAL | Status: DC
  Administered 2022-02-06 – 2022-02-11 (×6): 75 mg via ORAL
  Filled 2022-02-06 (×6): qty 1

## 2022-02-06 MED ORDER — DUPILUMAB 300 MG/2ML ~~LOC~~ SOAJ: 300.0000 mg | SUBCUTANEOUS | Status: DC

## 2022-02-06 MED ORDER — ALBUTEROL SULFATE HFA 108 (90 BASE) MCG/ACT IN AERS: 2.0000 | INHALATION_SPRAY | RESPIRATORY_TRACT | Status: DC | PRN

## 2022-02-06 NOTE — Consult Note (Signed)
?Cardiology Consultation:  ? ?Patient ID: Brian Pacheco ?MRN: [id number]; DOB: December 31, 1940 ? ?Admit date: 02/05/2022 ?Date of Consult: 02/06/2022 ? ?PCP:  Derinda Late, MD ?  ?[facility] Providers ?Cardiologist:  [facility]  ? }   ? ? ?Patient Profile:  ? ?Brian Pacheco is a 81 y.o. male with a hx of asthma, CKD III, HTN, liver transplant  who is being seen 02/06/2022 for the evaluation of CHF  at the request of Dr. Nevada Crane . ? ?History of Present Illness:  ? ?Mr. Brian is a 85 Pacheco with hx of HTN, asthma, DM ?Developed sudden dyspnea 2 days ago  ?Worse with exertion  ?+ leg edema ?Chest tightness ,   ?EMS was called, was wheezing  ?Admits to eating lots of salty , processed foods  ? ? ? ? ?Past Medical History:  ?Diagnosis Date  ? Asthma   ? Cancer [facility] Pacheco)   ? skin cancer  ? Diabetes mellitus without complication ([place])   ? GERD (gastroesophageal reflux disease)   ? Hyperlipidemia   ? Hypertension   ? Liver disease due to [place] ([place])   ? liver replacement per pt report  ? Peripheral vascular disease ([place])   ? ? ?Past Surgical History:  ?Procedure Laterality Date  ? CAROTID ENDARTERECTOMY    ? CHOLECYSTECTOMY    ? HERNIA REPAIR    ? umbilical incarcerated  ? IR ANGIOGRAM EXTREMITY BILATERAL Bilateral   ? patient states he has stents in both legs  ? LIVER TRANSPLANT  2003  ? LOWER EXTREMITY ANGIOGRAPHY Left 10/18/2018  ? Procedure: LOWER EXTREMITY ANGIOGRAPHY;  Surgeon: Algernon Huxley, MD;  Location: [facility];  Service: Cardiovascular;  Laterality: Left;  ? TONSILLECTOMY    ? VASECTOMY  1977  ?  ? ?Home Medications:  ?Prior to Admission medications   ?Medication Sig Start Date End Date Taking? Authorizing Provider  ?albuterol (PROVENTIL HFA;VENTOLIN HFA) 108 (90 Base) MCG/ACT inhaler Inhale 2 puffs into the lungs every 4 (four) hours as needed.    Yes [provider]  ?cephALEXin (KEFLEX) 500 MG capsule Take 500 mg by mouth 4 (four) times daily.   Yes [provider]  ?clopidogrel (PLAVIX) 75 MG  tablet Take 75 mg by mouth daily.    Yes [provider]  ?Dupilumab 300 MG/2ML SOPN Inject 300 mg into the skin every 14 (fourteen) days.   Yes [provider]  ?fenofibrate 160 MG tablet Take 160 mg by mouth daily.  03/07/18  Yes [provider]  ?Fluticasone-Umeclidin-Vilant (TRELEGY ELLIPTA) 100-62.5-25 MCG/INH AEPB Inhale 1 puff into the lungs daily. 10/28/19  Yes [provider]  ?glipiZIDE (GLUCOTROL) 5 MG tablet Take 5 mg by mouth 2 (two) times daily before a meal.  03/24/10  Yes [provider]  ?losartan (COZAAR) 100 MG tablet Take 100 mg by mouth daily.    Yes [provider]  ?metFORMIN (GLUCOPHAGE) 500 MG tablet Take 500 mg by mouth 2 (two) times daily with a meal.  03/24/10  Yes [provider]  ?mycophenolate (CELLCEPT) 250 MG capsule Take 250 mg by mouth 2 (two) times daily.   Yes [provider]  ?omeprazole (PRILOSEC) 20 MG capsule Take 20 mg by mouth daily.  08/13/10  Yes [provider]  ?sildenafil (VIAGRA) 100 MG tablet Take 100 mg by mouth daily.   Yes [provider]  ?tamsulosin (FLOMAX) 0.4 MG CAPS capsule Take 0.4 mg by mouth daily.    Yes [provider]  ?venlafaxine (  EFFEXOR) 75 MG tablet Take 75 mg by mouth 3 (three) times daily.    Yes [provider]  ?amLODipine (NORVASC) 10 MG tablet Take 10 mg by mouth daily.  ?Patient not taking: Reported on 02/06/2022    [provider]  ?aspirin EC 81 MG tablet Take 1 tablet (81 mg total) by mouth daily. ?Patient not taking: Reported on 02/06/2022 10/18/18   Algernon Huxley, MD  ?budesonide-formoterol [facility]) 80-4.5 MCG/ACT inhaler Inhale 2 puffs into the lungs 2 (two) times a day. ?Patient not taking: Reported on 02/06/2022 04/04/19   Henreitta Leber, MD  ?Cholecalciferol 25 MCG (1000 UT) tablet Take 1 tablet by mouth daily. ?Patient not taking: Reported on 02/06/2022 02/17/17   [provider]  ?DPH-Lido-AlHydr-MgHydr-Simeth  (FIRST-MOUTHWASH BLM MT) Use as directed in the mouth or throat as needed. ?Patient not taking: Reported on 02/06/2022    [provider]  ?guaiFENesin (MUCINEX) 600 MG 12 hr tablet Take by mouth 2 (two) times daily. ?Patient not taking: Reported on 02/06/2022    [provider]  ?insulin aspart (NOVOLOG) 100 UNIT/ML FlexPen Inject into the skin. Sliding scale ?Patient not taking: Reported on 02/06/2022    [provider]  ?insulin glargine (LANTUS) 100 UNIT/ML injection Inject into the skin. Sliding scale ?Patient not taking: Reported on 02/06/2022    [provider]  ?predniSONE (DELTASONE) 10 MG tablet Label  & dispense according to the schedule below. 5 Pills PO for 1 day then, 4 Pills PO for 1 day, 3 Pills PO for 1 day, 2 Pills PO for 1 day, 1 Pill PO for 1 days then STOP. ?Patient not taking: Reported on 02/06/2022 04/04/19   Henreitta Leber, MD  ?rosuvastatin (CRESTOR) 5 MG tablet Take 2.5 mg by mouth at bedtime.  09/18/18 09/18/19  [provider]  ?tiZANidine (ZANAFLEX) 4 MG tablet Take 4 mg by mouth every 6 (six) hours as needed for muscle spasms. ?Patient not taking: Reported on 02/06/2022    [provider]  ?traMADol (ULTRAM) 50 MG tablet Take 50 mg by mouth 2 (two) times daily.  ?Patient not taking: Reported on 02/06/2022    [provider]  ? ? ?Inpatient Medications: ?Scheduled Meds: ? bisoprolol  5 mg Oral Daily  ? clopidogrel  75 mg Oral Daily  ? Dupilumab  300 mg Subcutaneous Q14 Days  ? enoxaparin (LOVENOX) injection  0.5 mg/kg Subcutaneous Q24H  ? fenofibrate  160 mg Oral Daily  ? fluticasone furoate-vilanterol  1 puff Inhalation Daily  ? And  ? umeclidinium bromide  1 puff Inhalation Daily  ? furosemide  40 mg Intravenous BID  ? insulin aspart  0-20 Units Subcutaneous TID WC  ? insulin aspart  0-5 Units Subcutaneous QHS  ? insulin aspart  6 Units Subcutaneous TID WC  ? insulin glargine-yfgn  30 Units Subcutaneous BID  ? ipratropium-albuterol  3 mL  Nebulization Q6H  ? mycophenolate  250 mg Oral BID  ? pantoprazole  40 mg Oral Daily  ? potassium chloride  40 mEq Oral Daily  ? tamsulosin  0.4 mg Oral Daily  ? venlafaxine  75 mg Oral TID  ? ?Continuous Infusions: ? ?PRN Meds: ?albuterol, melatonin, ondansetron (ZOFRAN) IV, polyethylene glycol ? ?Allergies:    ?Allergies  ?Allergen Reactions  ? Hydralazine Rash and Shortness Of Breath  ? Fenofibrate Other (See Comments)  ? Pravastatin   ?  Other reaction(s): Other (See Comments)  ? Benazepril   ?  Other reaction(s): Other (See  Comments)  ? Sitagliptin Other (See Comments) and Rash  ?  blisters ?Other reaction(s): Other (see comments) ?Other reaction(s): Other (See Comments) ?blisters ?blisters ?blisters ?Other reaction(s): Other (See Comments) ?blisters ?blisters ?  ? ? ?Social History:   ?Social History  ? ?Socioeconomic History  ? Marital status: Married  ?  Spouse name: Not on file  ? Number of children: Not on file  ? Years of education: Not on file  ? Highest education level: Not on file  ?Occupational History  ? Not on file  ?Tobacco Use  ? Smoking status: Former  ?  Packs/day: 0.25  ?  Years: 60.00  ?  Pack years: 15.00  ?  Types: Cigarettes  ?  Quit date: 10/15/2018  ?  Years since quitting: 3.3  ? Smokeless tobacco: Never  ? Tobacco comments:  ?  currently going to smoking cessation clinic at [facility], smoked 1 ppd until recently, down to 7 a day  ?Substance and Sexual Activity  ? Alcohol use: Not Currently  ?  Comment: history of alcohol abuse quit 2003  ? Drug use: Never  ? Sexual activity: Not on file  ?Other Topics Concern  ? Not on file  ?Social History Narrative  ? Not on file  ? ?Social Determinants of Health  ? ?Financial Resource Strain: Not on file  ?Food Insecurity: Not on file  ?Transportation Needs: Not on file  ?Physical Activity: Not on file  ?Stress: Not on file  ?Social Connections: Not on file  ?Intimate Partner Violence: Not on file  ?  ?Family History:   ? ?Family History  ?Problem  Relation Age of Onset  ? Heart disease Father   ?  ? ?ROS:  ?Please see the history of present illness.  ? ?All other ROS reviewed and negative.    ? ?Physical Exam/Data:  ? ?Vitals:  ? 02/06/22 0430 02/06/22 0530 0

## 2022-02-06 NOTE — Progress Notes (Signed)
?  Progress Note ? ? ?Patient: Brian Pacheco VHQ:[id number] DOB: [birth year] DOA: 02/05/2022     0 ?DOS: the patient was seen and examined on 02/06/2022 at 8:55 AM ?  ? ? ? ?Brief hospital course: ?[name] is a 81 y.o. M with asthma, DM, cirrhosis, CKD IIIb baseline 1.8, HTN, spinal stenosis who presentd with SOB worse with exertion.   ? ?In the ER, CTA chest ruled out PE, showed bilateral edema, small effusions.  Troponin low elevation consistent with CHF flare.  Started on lasix and admitted for CHF. ? ? ? ? ?Assessment and Plan: ?* Acute CHF (congestive heart failure) ([place]) ?Echo ?Lasix ?I'Os  ? ?Status post liver transplantation ([place]) ?MMF ? ?Immunosuppression ([place]) ?  ? ?Mood disorder ([place]) ?  ? ?COPD, moderate ([place]) ?  ? ?Stage 3b chronic kidney disease (CKD) ([place]) ?Cr at baseline ? ?Controlled type 2 diabetes mellitus with diabetic nephropathy, with long-term current use of insulin ([place]) ?And with hyperglycemia ?Increase insulins ? ?Peripheral arterial disease ([place]) ?Stents in bialteral iliacs, and s/p CEA on left ? ? ? ? ? ? ? ? ? ?Subjective: Making good urine, mentation good.  Still some chest tightness. ? ? ? ? ?Physical Exam: ?Vitals:  ? 02/06/22 0400 02/06/22 0430 02/06/22 0530 02/06/22 0605  ?BP: 124/80 128/77 128/74 114/75  ?Pulse: (!) 106 (!) 107 (!) 117 (!) 112  ?Resp: 18 (!) 23 (!) 24 15  ?Temp:      ?TempSrc:      ?SpO2: 95% 95% 95% 94%  ?Weight:      ?Height:      ? ?The patient was seen and examined, he has mild nonpitting edema bilaterally lower extremities.  Mentation normal. ? ?Data Reviewed: ?This is a note triage note, for further details, please see the H&P by my partner Dr. Nevada Crane from earlier today. ?Echo pending ?Ultrasound bilateral lower extremities negative. ? ?Family Communication:  ? ? ? ?Disposition: ?Status is: Inpatient ? ? ? ? ? ? ? ? ?Author: ?Edwin Dada, MD ?02/06/2022 9:02 AM ? ?For on call review [email or web address].  ? ? ?

## 2022-02-06 NOTE — ED Notes (Addendum)
Dr. Loleta Books at bedside at this time.  ?

## 2022-02-06 NOTE — Assessment & Plan Note (Addendum)
Net IO Since Admission: -5,920 mL [02/09/22 1327] ? ?Cardiology re-reviewed echo, and feel like his EF may be close to 50% ?- Likely stress dose before discharge per cardiology ?- Continue beta-blocker  ? ? ?

## 2022-02-06 NOTE — ED Notes (Signed)
Dr. Loleta Books, MD notified r/t pt's elevated CBG.  ?

## 2022-02-06 NOTE — Assessment & Plan Note (Addendum)
This was not initially supsected, but now suspect there may be some exacerbation here. ? 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  This may now be flaring, but not bad enough for steroids (given his brittle sugars) ?-Continue ICS/LABA/LAMA  ?- Continue Pulmicort and Claritin ?- Increase Trelegy dose at discharge ?- Pulm follow up at discharge  ?

## 2022-02-06 NOTE — Assessment & Plan Note (Addendum)
-  Continue glargine and SS corrections ?

## 2022-02-06 NOTE — Assessment & Plan Note (Addendum)
-  Continue MMF ?

## 2022-02-06 NOTE — H&P (Addendum)
?History and Physical ? ?Brian Pacheco BTD:[id number] DOB: 01-Jun-1941 DOA: 02/05/2022 ? ?Referring physician: Dr. Tamala Julian, [place]  ?PCP: Derinda Late, MD  ?Outpatient Specialists: Pulmonary, transplant. ?Patient coming from: Home ? ? ? ?Chief Complaint: Shortness of breath. ? ?HPI: Brian Pacheco is a 81 y.o. male with medical history significant for asthma, type 2 diabetes, GERD, hyperlipidemia, hypertension, DDD, lumbar stenosis with neurogenic claudication, lumbar radiculitis, who presented to [facility] ED with complaints of sudden onset shortness of breath on 02/05/2022, worse with ambulation.  He felt as if he could barely catch his breath with minimal exertion.  Endorses worsening lower extremity edema bilaterally.  Associated with chest tightness while at rest.  EMS was activated.  Upon EMS arrival, the patient was noted to be audibly wheezing, not hypoxic.  He received a dose of Solu-Medrol 125 mg x 1 and 2 nebulizer treatments in route to the ED.   ? ?Work-up in the ED revealed elevated BNP, mild pulmonary edema seen on chest x-ray, volume overloaded on exam with bilateral lower extremity edema, elevated high-sensitivity troponins 88, up trended to 109.  Received a dose of IV Lasix and 1 nebulizer treatment in the ED.  Also received a full dose aspirin due to concern for chest tightness and elevated troponin.  EDP requesting admission.  Patient admitted by hospitalist service, TRH. ? ?ED Course: Tmax 98.5.  BP 132/81, pulse 110, respiratory 26, saturation 93% on room air.  Lab studies remarkable for troponin 88, 109.  BNP 236.  Serum glucose 210, BUN 26, creatinine 1.63, at baseline.  GFR 34.  Mild interstitial edema seen on chest x-ray. ? ?Review of Systems: ?Review of systems as noted in the HPI. All other systems reviewed and are negative. ? ? ?Past Medical History:  ?Diagnosis Date  ? Asthma   ? Cancer [facility])   ? skin cancer  ? Diabetes mellitus without complication ([place])   ? GERD (gastroesophageal reflux disease)    ? Hyperlipidemia   ? Hypertension   ? 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 liver replacement per pt report  ? Peripheral vascular disease ([place])   ? ?Past Surgical History:  ?Procedure Laterality Date  ? CAROTID ENDARTERECTOMY    ? CHOLECYSTECTOMY    ? HERNIA REPAIR    ? umbilical incarcerated  ? IR ANGIOGRAM EXTREMITY BILATERAL Bilateral   ? patient states he has stents in both legs  ? LIVER TRANSPLANT  2003  ? LOWER EXTREMITY ANGIOGRAPHY Left 10/18/2018  ? Procedure: LOWER EXTREMITY ANGIOGRAPHY;  Surgeon: Algernon Huxley, MD;  Location: [place] [facility];  Service: Cardiovascular;  Laterality: Left;  ? TONSILLECTOMY    ? VASECTOMY  1977  ? ? ?Social History:  reports that he quit smoking about 3 years ago. He has a 15.00 pack-year smoking history. He has never used smokeless tobacco. He reports that he does not currently use alcohol. He reports that he does not use drugs. ? ? ?Allergies  ?Allergen Reactions  ? Hydralazine Rash and Shortness Of Breath  ? Fenofibrate Other (See Comments)  ? Pravastatin   ?  Other reaction(s): Other (See Comments)  ? Benazepril   ?  Other reaction(s): Other (See Comments)  ? Sitagliptin Other (See Comments) and Rash  ?  blisters ?Other reaction(s): Other (see comments) ?Other reaction(s): Other (See Comments) ?blisters ?blisters ?blisters ?Other reaction(s): Other (See Comments) ?blisters ?blisters ?  ? ? ?Family History  ?Problem Relation Age of Onset  ? Heart disease Father   ?  ? ? ?  Prior to Admission medications   ?Medication Sig Start Date End Date Taking? Authorizing Provider  ?albuterol (PROVENTIL HFA;VENTOLIN HFA) 108 (90 Base) MCG/ACT inhaler Inhale 2 puffs into the lungs every 4 (four) hours as needed.    Yes [provider]  ?cephALEXin (KEFLEX) 500 MG capsule Take 500 mg by mouth 4 (four) times daily.   Yes [provider]  ?clopidogrel (PLAVIX) 75 MG tablet Take 75 mg by mouth daily.    Yes [provider]  ?Dupilumab 300 MG/2ML SOPN Inject  300 mg into the skin every 14 (fourteen) days.   Yes [provider]  ?fenofibrate 160 MG tablet Take 160 mg by mouth daily.  03/07/18  Yes [provider]  ?Fluticasone-Umeclidin-Vilant (TRELEGY ELLIPTA) 100-62.5-25 MCG/INH AEPB Inhale 1 puff into the lungs daily. 10/28/19  Yes [provider]  ?glipiZIDE (GLUCOTROL) 5 MG tablet Take 5 mg by mouth 2 (two) times daily before a meal.  03/24/10  Yes [provider]  ?losartan (COZAAR) 100 MG tablet Take 100 mg by mouth daily.    Yes [provider]  ?metFORMIN (GLUCOPHAGE) 500 MG tablet Take 500 mg by mouth 2 (two) times daily with a meal.  03/24/10  Yes [provider]  ?mycophenolate (CELLCEPT) 250 MG capsule Take 250 mg by mouth 2 (two) times daily.   Yes [provider]  ?omeprazole (PRILOSEC) 20 MG capsule Take 20 mg by mouth daily.  08/13/10  Yes [provider]  ?sildenafil (VIAGRA) 100 MG tablet Take 100 mg by mouth daily.   Yes [provider]  ?tamsulosin (FLOMAX) 0.4 MG CAPS capsule Take 0.4 mg by mouth daily.    Yes [provider]  ?venlafaxine (EFFEXOR) 75 MG tablet Take 75 mg by mouth 3 (three) times daily.    Yes [provider]  ?amLODipine (NORVASC) 10 MG tablet Take 10 mg by mouth daily.  ?Patient not taking: Reported on 02/06/2022    [provider]  ?aspirin EC 81 MG tablet Take 1 tablet (81 mg total) by mouth daily. ?Patient not taking: Reported on 02/06/2022 10/18/18   Algernon Huxley, MD  ?budesonide-formoterol [facility]) 80-4.5 MCG/ACT inhaler Inhale 2 puffs into the lungs 2 (two) times a day. ?Patient not taking: Reported on 02/06/2022 04/04/19   Henreitta Leber, MD  ?Cholecalciferol 25 MCG (1000 UT) tablet Take 1 tablet by mouth daily. ?Patient not taking: Reported on 02/06/2022 02/17/17   [provider]  ?DPH-Lido-AlHydr-MgHydr-Simeth (FIRST-MOUTHWASH BLM MT) Use as directed in the mouth or throat as needed. ?Patient not taking: Reported on  02/06/2022    [provider]  ?guaiFENesin (MUCINEX) 600 MG 12 hr tablet Take by mouth 2 (two) times daily. ?Patient not taking: Reported on 02/06/2022    [provider]  ?insulin aspart (NOVOLOG) 100 UNIT/ML FlexPen Inject into the skin. Sliding scale ?Patient not taking: Reported on 02/06/2022    [provider]  ?insulin glargine (LANTUS) 100 UNIT/ML injection Inject into the skin. Sliding scale ?Patient not taking: Reported on 02/06/2022    [provider]  ?predniSONE (DELTASONE) 10 MG tablet Label  & dispense according to the schedule below. 5 Pills PO for 1 day then, 4 Pills PO for 1 day, 3 Pills PO for 1 day, 2 Pills PO for 1 day, 1 Pill PO for 1 days then STOP. ?Patient not taking: Reported on 02/06/2022 04/04/19   Henreitta Leber, MD  ?rosuvastatin (CRESTOR) 5 MG tablet Take 2.5 mg by mouth at bedtime.  09/18/18 09/18/19  [provider]  ?tiZANidine (ZANAFLEX) 4 MG tablet Take 4 mg by mouth every 6 (six) hours as needed for muscle spasms. ?Patient not taking: Reported on 02/06/2022    [provider]  ?traMADol (ULTRAM) 50 MG tablet Take 50 mg by mouth 2 (two) times daily.  ?Patient not taking: Reported on 02/06/2022    [provider]  ? ? ?Physical Exam: ?BP 133/81   Pulse (!) 113   Temp 98.5 ?F (36.9 ?C) (Oral)   Resp (!) 23   Ht '5\' 7"'$  (1.702 m)   Wt 90.7 kg   SpO2 94%   BMI 31.32 kg/m?  ? ?General: 81 y.o. year-old male well developed well nourished in no acute distress.  Alert and oriented x3. ?Cardiovascular: Regular rate and rhythm with no rubs or gallops.  No thyromegaly or JVD noted.  2+ pitting edema in lower extremities bilaterally.  2/4 pulses in all 4 extremities. ?Respiratory: Diffuse wheezing bilaterally.  Mild rales at bases.  Poor inspiratory effort. ?Abdomen: Soft nontender nondistended with normal bowel sounds x4 quadrants. ?Muskuloskeletal: No cyanosis or clubbing.  2+ pitting edema in lower extremities bilaterally.   ?Neuro: CN  II-XII intact, strength, sensation, reflexes ?Skin: No ulcerative lesions noted or rashes ?Psychiatry: Judgement and insight appear normal. Mood is appropriate for condition and setting ?   ?   ?   ?Labs on A

## 2022-02-06 NOTE — Assessment & Plan Note (Addendum)
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

## 2022-02-06 NOTE — Assessment & Plan Note (Addendum)
Continue venlafaxine 

## 2022-02-06 NOTE — Assessment & Plan Note (Addendum)
Due to liver transplant.  ?-Continue Cellcept ?

## 2022-02-06 NOTE — Hospital Course (Addendum)
Mr. Bacha is a 81 y.o. 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   ? ?In the ER, CTA chest ruled out PE, showed bilateral edema, small effusions.  Troponin low elevation consistent with CHF flare.  Started on lasix and admitted for CHF. ? ? ?4/9: Admitted on Lasix ?4/10: Symptoms resolved, Cardiology stopped Lasix, recommended ischemic work up ?4/11: Dyspnea and wheezing returned, Cardiology recommended resumption of diuresis; inhaled steroids increased ?4/12: Cardio planning stress test maybe tomorrow ?4/13: Myoview normal ?

## 2022-02-06 NOTE — Progress Notes (Signed)
PHARMACIST - PHYSICIAN COMMUNICATION ? ?CONCERNING:  Enoxaparin (Lovenox) for DVT Prophylaxis  ? ? ?RECOMMENDATION: ?Patient was prescribed enoxaprin '40mg'$  q24 hours for VTE prophylaxis.  ? ?Filed Weights  ? 02/05/22 2147  ?Weight: 90.7 kg (200 lb)  ? ? ?Body mass index is 31.32 kg/m?. ? ?Estimated Creatinine Clearance: 40.1 mL/min (A) (by C-G formula based on SCr of 1.63 mg/dL (H)). ? ? ?Based on [place] patient is candidate for enoxaparin 0.'5mg'$ /kg TBW SQ every 24 hours based on BMI being >30. ? ?DESCRIPTION: ?Pharmacy has adjusted enoxaparin dose per [facility] policy. ? ?Patient is now receiving enoxaparin 0.5 mg/kg every 24 hours  ? ?Renda Rolls, PharmD, MBA ?02/06/2022 ?3:52 AM ? ?

## 2022-02-06 NOTE — Assessment & Plan Note (Addendum)
Baseline Cr 1.8-2.1.   ?Lab Results  ?Component Value Date  ? CREATININE 2.46 (H) 02/09/2022  ? CREATININE 2.36 (H) 02/08/2022  ? CREATININE 2.24 (H) 02/07/2022  ? ?-Consult nephrology ?- Hold diuretics and nephrotoxins. ?- Trend Cr ? ?

## 2022-02-06 NOTE — Progress Notes (Signed)
*  PRELIMINARY RESULTS* ?Echocardiogram ?2D Echocardiogram has been performed. ? ?Claretta Fraise ?02/06/2022, 2:48 PM ?

## 2022-02-07 DIAGNOSIS — I10 Essential (primary) hypertension: Secondary | ICD-10-CM

## 2022-02-07 DIAGNOSIS — K7030 Alcoholic cirrhosis of liver without ascites: Secondary | ICD-10-CM | POA: Diagnosis not present

## 2022-02-07 DIAGNOSIS — I509 Heart failure, unspecified: Secondary | ICD-10-CM | POA: Diagnosis not present

## 2022-02-07 DIAGNOSIS — J449 Chronic obstructive pulmonary disease, unspecified: Secondary | ICD-10-CM | POA: Diagnosis not present

## 2022-02-07 DIAGNOSIS — E1121 Type 2 diabetes mellitus with diabetic nephropathy: Secondary | ICD-10-CM

## 2022-02-07 DIAGNOSIS — D849 Immunodeficiency, unspecified: Secondary | ICD-10-CM

## 2022-02-07 DIAGNOSIS — Z794 Long term (current) use of insulin: Secondary | ICD-10-CM

## 2022-02-07 LAB — BASIC METABOLIC PANEL
Anion gap: 8 (ref 5–15)
BUN: 37 mg/dL — ABNORMAL HIGH (ref 8–23)
CO2: 31 mmol/L (ref 22–32)
Calcium: 9.1 mg/dL (ref 8.9–10.3)
Chloride: 98 mmol/L (ref 98–111)
Creatinine, Ser: 2.24 mg/dL — ABNORMAL HIGH (ref 0.61–1.24)
GFR, Estimated: 29 mL/min — ABNORMAL LOW (ref >60)
Glucose, Bld: 128 mg/dL — ABNORMAL HIGH (ref 70–99)
Potassium: 4.6 mmol/L (ref 3.5–5.1)
Sodium: 137 mmol/L (ref 135–145)

## 2022-02-07 LAB — GLUCOSE, CAPILLARY
Glucose-Capillary: 136 mg/dL — ABNORMAL HIGH (ref 70–99)
Glucose-Capillary: 211 mg/dL — ABNORMAL HIGH (ref 70–99)
Glucose-Capillary: 247 mg/dL — ABNORMAL HIGH (ref 70–99)
Glucose-Capillary: 154 mg/dL — ABNORMAL HIGH (ref 70–99)
Glucose-Capillary: 155 mg/dL — ABNORMAL HIGH (ref 70–99)

## 2022-02-07 MED ORDER — IPRATROPIUM-ALBUTEROL 0.5-2.5 (3) MG/3ML IN SOLN
3.0000 mL | Freq: Three times a day (TID) | RESPIRATORY_TRACT | Status: DC
  Administered 2022-02-07 – 2022-02-11 (×12): 3 mL via RESPIRATORY_TRACT
  Filled 2022-02-07 (×13): qty 3

## 2022-02-07 NOTE — Assessment & Plan Note (Addendum)
BP normal ?-Continue bisoprolol ?-Hold amlodipine, resume at d/c ?-Hold losartan given renal function ?

## 2022-02-07 NOTE — Consult Note (Signed)
? ?  Heart Failure Nurse Navigator Note ? 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  Mild concentric LVH.  Grade 2 diastolic dysfunction.  Mild tricuspid stenosis.  Moderate mitral regurgitation. ? ?He presented to the emergency room with complaints of worsening shortness of breath, bilateral lower extremity edema and chest tightness while at rest.  BNP 236.  Chest x-ray interstitial edema. ? ?Comorbidities: ? ?Asthma ?Diabetes type 2 ?GERD ?Hyperlipidemia ?Hypertension ?Degenerative joint disease ?Previous smoker ?Liver transplant ? ?Medications: ? ?Bisoprolol 5 mg daily ?Plavix 75 mg daily ?Fenofibrate 160 mg daily ?Furosemide is currently on hold. ? ?Labs: ? ?Sodium 137, potassium 4.6, chloride 98, CO2 31, BUN 37, creatinine 2.24 up from 1.58 of yesterday, estimated GFR 29 ?Weight is 103.3 kg ?Blood pressure 137/78 ?Intake 580 mL ?Output 1325 mL ? ?Initial meeting with patient, he was sitting on the edge of the bed in no acute distress.  He states that he is 1 that likes his salt and processed foods but knows that he needs to make changes in that regard such as using Mrs. Dash for seasoning along with natural spices, eating fresh and frozen fruits and vegetables along with lean meats. ? ? ?Discussed heart failure and what it means. ? ?Talked about fluid restriction, he states in the past he has been told to drink a lot of fluids with the medication that he is taking after his liver transplant.  Asked him to discuss that with the physicians. ? ?Also discussed daily weights and what to report, also went over signs and symptoms to report. ? ?He was given the living with heart failure teaching booklet, weight chart, information on low-sodium and heart failure. ? ?He is has a follow-up appointment in the outpatient heart failure clinic on April 21 at 1 PM. ? ?Pricilla Riffle RN [facility] ?

## 2022-02-07 NOTE — Plan of Care (Signed)
Nutrition Education Note ? ?RD consulted for nutrition education regarding CHF and diabetes. ? ?Lab Results  ?Component Value Date  ? HGBA1C 7.8 (H) 02/06/2022  ? PTA DM medications are none.  ? ?Labs reviewed: CBGS: 54-247 (inpatient orders for glycemic control are 0-20 units insulin aspart TID with meals and 25 units insulin glargine-yfgn daily).   ? ?Case discussed with Heart Failure RN, who saw pt earlier.  ? ?Spoke with pt at bedside, who reports feeling well and having a good appetite. Pt really enjoys the hospital food. He reports he was consuming a lot of salt and sugar at home, but has been communicating with wife about how to change food preparation methods to decrease salt and sugar in diet. Pt intends on using Mrs Deliah Boston at home. Reviewed ways to decrease sodium and sugar in diet. Pt able to discuss when to way and when it is appropriate to call MD regarding weight.  ? ?RD provided "Heart Healthy, Consistent Carbohydrate Nutrition Therapy" handout from the Academy of [employer]etics. Reviewed patient's dietary recall. Provided examples on ways to decrease sodium intake in diet. Discouraged intake of processed foods and use of salt shaker. Encouraged fresh fruits and vegetables as well as whole grain sources of carbohydrates to maximize fiber intake.  ? ?RD discussed why it is important for patient to adhere to diet recommendations, and emphasized the role of fluids, foods to avoid, and importance of weighing self daily.  ? ?Discussed different food groups and their effects on blood sugar, emphasizing carbohydrate-containing foods. Provided list of carbohydrates and recommended serving sizes of common foods. ? ?Discussed importance of controlled and consistent carbohydrate intake throughout the day. Provided examples of ways to balance meals/snacks and encouraged intake of high-fiber, whole grain complex carbohydrates. Teach back method used. ? ?Expect fair to good compliance. ? ?Current diet order  is heart healthy/ carb modified, patient is consuming approximately 100% of meals at this time. Labs and medications reviewed. No further nutrition interventions warranted at this time. RD contact information provided. If additional nutrition issues arise, please re-consult RD.  ? ?Loistine Chance, RD, LDN, CDCES ?Registered Dietitian II ?Certified Diabetes Care and Education Specialist ?Please refer to [facility] for RD and/or RD on-call/weekend/after hours pager    ?

## 2022-02-07 NOTE — Progress Notes (Signed)
OT Cancellation Note ? ?Patient Details ?Name: Brian Pacheco ?MRN: [id number] ?DOB: [birth year] ? ? ?Cancelled Treatment:    Reason Eval/Treat Not Completed: OT screened, no needs identified, will sign off. Order received, chart reviewed. Per conversation with PT, pt back to baseline functional independence. No skilled OT needs identified. Will sign off. Please re-consult if additional needs arise.  ? ?Dessie Coma, M.S. OTR/L  ?02/07/22, 10:02 AM  ?ascom [phone] ? ?

## 2022-02-07 NOTE — Discharge Instructions (Signed)
Heart Healthy, Consistent Carbohydrate Nutrition Therapy  ? ?A heart-healthy and consistent carbohydrate diet is recommended to manage heart disease and diabetes. ?To follow a heart-healthy and consistent carbohydrate diet, ?Eat a balanced diet with whole grains, fruits and vegetables, and lean protein sources.  ?Choose heart-healthy unsaturated fats. Limit saturated fats, trans fats, and cholesterol intake. Eat more plant-based or vegetarian meals using beans and soy foods for protein.  ?Eat whole, unprocessed foods to limit the amount of sodium (salt) you eat.  ?Choose a consistent amount of carbohydrate at each meal and snack. Limit refined carbohydrates especially sugar, sweets and sugar-sweetened beverages.  ?If you drink alcohol, do so in moderation: one serving per day (women) and two servings per day (men). ?o One serving is equivalent to 12 ounces beer, 5 ounces wine, or 1.5 ounces distilled spirits ? ?Tips ?Tips for Choosing Heart-Healthy Fats ?Choose lean protein and low-fat dairy foods to reduce saturated fat intake. ?Saturated fat is usually found in animal-based protein and is associated with certain health risks. Saturated fat is the biggest contributor to raise low-density lipoprotein (LDL) cholesterol levels. Research shows that limiting saturated fat lowers unhealthy cholesterol levels. Eat no more than 7% of your total calories each day from saturated fat. Ask your RDN to help you determine how much saturated fat is right for you. ?There are many foods that do not contain large amounts of saturated fats. Swapping these foods to replace foods high in saturated fats will help you limit the saturated fat you eat and improve your cholesterol levels. You can also try eating more plant-based or vegetarian meals. ?Instead of? Try:  ?Whole milk, cheese, yogurt, and ice cream 1% or skim milk, low-fat cheese, non-fat yogurt, and low-fat ice cream  ?Fatty, marbled beef and pork Lean beef, pork, or venison   ?Poultry with skin Poultry without skin  ?Butter, stick margarine Reduced-fat, whipped, or liquid spreads  ?Coconut oil, palm oil Liquid vegetable oils: corn, canola, olive, soybean and safflower oils  ? ?Avoid foods that contain trans fats. ?Trans fats increase levels of LDL-cholesterol. Hydrogenated fat in processed foods is the main source of trans fats in foods.  ?Trans fats can be found in stick margarine, shortening, processed sweets, baked goods, some fried foods, and packaged foods made with hydrogenated oils. Avoid foods with ?partially hydrogenated oil? on the ingredient list such as: cookies, pastries, baked goods, biscuits, crackers, microwave popcorn, and frozen dinners. ?Choose foods with heart healthy fats. ?Polyunsaturated and monounsaturated fat are unsaturated fats that may help lower your blood cholesterol level when used in place of saturated fat in your diet. ?Ask your RDN about taking a dietary supplement with plant sterols and stanols to help lower your cholesterol level. ?Research shows that substituting saturated fats with unsaturated fats is beneficial to cholesterol levels. Try these easy swaps: ?Instead of? Try:  ?Butter, stick margarine, or solid shortening Reduced-fat, whipped, or liquid spreads  ?Beef, pork, or poultry with skin Fish and seafood  ?Chips, crackers, snack foods Raw or unsalted nuts and seeds or nut butters ?Hummus with vegetables ?Avocado on toast  ?Coconut oil, palm oil Liquid vegetable oils: corn, canola, olive, soybean and safflower oils  ?Limit the amount of cholesterol you eat to less than 200 milligrams per day. ?Cholesterol is a substance carried through the bloodstream via lipoproteins, which are known as ?transporters? of fat. Some body functions need cholesterol to work properly, but too much cholesterol in the bloodstream can damage arteries and build up blood vessel linings (  which can lead to heart attack and stroke). You should eat less than 200 milligrams  cholesterol per day. ?People respond differently to eating cholesterol. There is no test available right now that can figure out which people will respond more to dietary cholesterol and which will respond less. For individuals with high intake of dietary cholesterol, different types of increase (none, small, moderate, large) in LDL-cholesterol levels are all possible.  ?Food sources of cholesterol include egg yolks and organ meats such as liver, gizzards. Limit egg yolks to two to four per week and avoid organ meats like liver and gizzards to control cholesterol intake. ?Tips for Choosing Heart-Healthy Carbohydrates ?Consume a consistent amount of carbohydrate ?It is important to eat foods with carbohydrates in moderation because they impact your blood glucose level. Carbohydrates can be found in many foods such as: ?Grains (breads, crackers, rice, pasta, and cereals)  ?Starchy Vegetables (potatoes, corn, and peas)  ?Beans and legumes  ?Milk, soy milk, and yogurt  ?Fruit and fruit juice  ?Sweets (cakes, cookies, ice cream, jam and jelly) ?Your RDN will help you set a goal for how many carbohydrate servings to eat at your meals and snacks. For many adults, eating 3 to 5 servings of carbohydrate foods at each meal and 1 or 2 carbohydrate servings for each snack works well.  ?Check your blood glucose level regularly. It can tell you if you need to adjust when you eat carbohydrates. ?Choose foods rich in viscous (soluble) fiber ?Viscous, or soluble, is found in the walls of plant cells. Viscous fiber is found only in plant-based foods. Eating foods with fiber helps to lower your unhealthy cholesterol and keep your blood glucose in range  ?Rich sources of viscous fiber include vegetables (asparagus, Brussels sprouts, sweet potatoes, turnips) fruit (apricots, mangoes, oranges), legumes, and whole grains (barley, oats, and oat bran).  ?As you increase your fiber intake gradually, also increase the amount of water you  drink. This will help prevent constipation.  ?If you have difficulty achieving this goal, ask your RDN about fiber laxatives. Choose fiber supplements made with viscous fibers such as psyllium seed husks or methylcellulose to help lower unhealthy cholesterol.  ?Limit refined carbohydrates  ?There are three types of carbohydrates: starches, sugar, and fiber. Some carbohydrates occur naturally in food, like the starches in rice or corn or the sugars in fruits and milk. Refined carbohydrates--foods with high amounts of simple sugars--can raise triglyceride levels. High triglyceride levels are associated with coronary heart disease. ?Some examples of refined carbohydrate foods are table sugar, sweets, and beverages sweetened with added sugar. ?Tips for Reducing Sodium (Salt) ?Although sodium is important for your body to function, too much sodium can be harmful for people with high blood pressure. As sodium and fluid buildup in your tissues and bloodstream, your blood pressure increases. High blood pressure may cause damage to other organs and increase your risk for a stroke. ?Even if you take a pill for blood pressure or a water pill (diuretic) to remove fluid, it is still important to have less salt in your diet. Ask your doctor and RDN what amount of sodium is right for you. ?Avoid processed foods. Eat more fresh foods.  ?Fresh fruits and vegetables are naturally low in sodium, as well as frozen vegetables and fruits that have no added juices or sauces.  ?Fresh meats are lower in sodium than processed meats, such as bacon, sausage, and hotdogs. Read the nutrition label or ask your butcher to help you find a  fresh meat that is low in sodium. ?Eat less salt--at the table and when cooking.  ?A single teaspoon of table salt has 2,300 mg of sodium.  ?Leave the salt out of recipes for pasta, casseroles, and soups.  ?Ask your RDN how to cook your favorite recipes without sodium ?Be a Paramedic.  ?Look for food packages  that say ?salt-free? or ?sodium-free.? These items contain less than 5 milligrams of sodium per serving.  ??Very low-sodium? products contain less than 35 milligrams of sodium per serving.  ??Low-sodium? pr

## 2022-02-07 NOTE — Progress Notes (Signed)
? ? ?Progress Note ? ?Patient Name: Brian Pacheco ?Date of Encounter: 02/07/2022 ? ?Primary Cardiologist: New to [facility] - consult by [place] ? ?Subjective  ? ?Dyspnea improving.  No angina.  Documented urine output approximately 700 mL for the past 24 hours, net -1.7 L for the admission.  Renal function uptrending to 2.24 with a baseline approximately 1.6-1.8.  High-sensitivity troponin 143.  Initially scheduled for Lexiscan MPI this morning, though we canceled this given cardiomyopathy noted on echo.  Patient also notes a facial rash that began prior to his admission.  He indicates this has typically occurred when eating vinegar or spicy foods, though he denies either of these prior to his admission. ? ?Inpatient Medications  ?  ?Scheduled Meds: ? bisoprolol  5 mg Oral Daily  ? clopidogrel  75 mg Oral Daily  ? enoxaparin (LOVENOX) injection  0.5 mg/kg Subcutaneous Q24H  ? fenofibrate  160 mg Oral Daily  ? fluticasone furoate-vilanterol  1 puff Inhalation Daily  ? And  ? umeclidinium bromide  1 puff Inhalation Daily  ? insulin aspart  0-20 Units Subcutaneous TID WC  ? insulin glargine-yfgn  25 Units Subcutaneous Daily  ? ipratropium-albuterol  3 mL Nebulization TID  ? mycophenolate  250 mg Oral BID  ? pantoprazole  40 mg Oral Daily  ? tamsulosin  0.4 mg Oral Daily  ? venlafaxine  75 mg Oral TID  ? ?Continuous Infusions: ? ?PRN Meds: ?albuterol, hydrocortisone cream, melatonin, ondansetron (ZOFRAN) IV, phenol, polyethylene glycol  ? ?Vital Signs  ?  ?Vitals:  ? 02/07/22 0332 02/07/22 0756 02/07/22 0815 02/07/22 1159  ?BP: (!) 101/91 122/64  137/78  ?Pulse: 89 80  78  ?Resp: '18 18  18  '$ ?Temp: 98.4 ?F (36.9 ?C) 98.4 ?F (36.9 ?C)  98.3 ?F (36.8 ?C)  ?TempSrc: Oral Oral  Oral  ?SpO2: 91% 97% 98% 99%  ?Weight: 103.3 kg     ?Height:      ? ? ?Intake/Output Summary (Last 24 hours) at 02/07/2022 1312 ?Last data filed at 02/07/2022 1032 ?Gross per 24 hour  ?Intake 720 ml  ?Output 1075 ml  ?Net -355 ml  ? ?Filed Weights  ? 02/05/22  2147 02/07/22 0332  ?Weight: 90.7 kg 103.3 kg  ? ? ?Telemetry  ?  ?Sinus tach with short atrial runs, PACs, PVCs- Personally Reviewed ? ?ECG  ?  ?No new tracings - Personally Reviewed ? ?Physical Exam  ? ?GEN: No acute distress.  Mildly erythematous rash noted along the face and neck. ?Neck: No JVD. ?Cardiac: RRR, no murmurs, rubs, or gallops.  ?Respiratory: Mildly diminished breath sounds along the bases bilaterally.  ?GI: Soft, nontender, non-distended.   ?MS: No edema; No deformity. ?Neuro:  Alert and oriented x 3; Nonfocal.  ?Psych: Normal affect. ? ?Labs  ?  ?Chemistry ?Recent Labs  ?Lab 02/05/22 ?2150 02/06/22 ?0424 02/07/22 ?9833  ?NA 141 136 137  ?K 3.9 4.6 4.6  ?CL 103 99 98  ?CO2 '29 28 31  '$ ?GLUCOSE 210* 323* 128*  ?BUN 26* 27* 37*  ?CREATININE 1.63* 1.85* 2.24*  ?CALCIUM 8.4* 8.8* 9.1  ?PROT 6.7 7.2  --   ?ALBUMIN 3.3* 3.5  --   ?AST 24 39  --   ?ALT 18 19  --   ?ALKPHOS 63 55  --   ?BILITOT 0.5 0.6  --   ?GFRNONAA 43* 37* 29*  ?ANIONGAP '9 9 8  '$ ?  ? ?Hematology ?Recent Labs  ?Lab 02/05/22 ?2150 02/06/22 ?0424  ?WBC 9.2 7.9  ?  RBC 4.26 4.21*  ?HGB 12.9* 12.7*  ?HCT 40.1 39.5  ?MCV 94.1 93.8  ?MCH 30.3 30.2  ?MCHC 32.2 32.2  ?RDW 12.9 13.1  ?PLT 181 184  ? ? ?Cardiac EnzymesNo results for input(s): TROPONINI in the last 168 hours. No results for input(s): TROPIPOC in the last 168 hours.  ? ?BNP ?Recent Labs  ?Lab 02/05/22 ?2150  ?BNP 236.9*  ?  ? ?DDimer  ?Recent Labs  ?Lab 02/05/22 ?2150  ?DDIMER 0.82*  ?  ? ?Radiology  ?  ?DG Chest 2 View ? ?Result Date: 02/05/2022 ?IMPRESSION: 1. Mild diffuse increased interstitial opacities suggestive of mild interstitial edema or inflammatory process. Probable tiny bilateral effusions. Electronically Signed   By: Donavan Foil M.D.   On: 02/05/2022 22:14  ? ?CT Angio Chest PE W and/or Wo Contrast ? ?Result Date: 02/05/2022 ?IMPRESSION: 1. No evidence of pulmonary embolism. 2. Mild interstitial edema with mild bibasilar atelectasis and/or infiltrate. 3. Very mild, slightly  nodular appearing right middle lobe infiltrate which may represent sequelae associated with an atypical infectious process. Follow-up to resolution is recommended. 4. Small bilateral pleural effusions. 5. Mild AP window, paratracheal and right hilar lymphadenopathy, likely reactive. 6. Moderate severity coronary artery disease. 7. 6 mm anterior left upper lobe and 4 mm right upper lobe noncalcified lung nodules. Non-contrast chest CT at 3-6 months is recommended. If the nodules are stable at time of repeat CT, then future CT at 18-24 months (from today's scan) is considered optional for low-risk patients, but is recommended for high-risk patients. This recommendation follows the consensus statement: Guidelines for Management of Incidental Pulmonary Nodules Detected on CT Images: From the Fleischner Society 2017; Radiology 2017; 284:228-243. Aortic Atherosclerosis (ICD10-I70.0). Electronically Signed   By: Virgina Norfolk M.D.   On: 02/05/2022 23:39  ? ?US Venous Img Lower Bilateral (DVT) ? ?Result Date: 02/06/2022 ?IMPRESSION: 1. No evidence of deep venous thrombosis in either lower extremity. . Electronically Signed   By: Vinnie Langton M.D.   On: 02/06/2022 07:49  ? ?Cardiac Studies  ? ?2D echo 02/06/2022: ?1. Left ventricular ejection fraction, by estimation, is 40 to 45%. The  ?left ventricle has mildly decreased function. The left ventricle  ?demonstrates global hypokinesis. There is mild concentric left ventricular  ?hypertrophy. Left ventricular diastolic  ?parameters are consistent with Grade III diastolic dysfunction  ?(restrictive).  ? 2. Right ventricular systolic function is normal. The right ventricular  ?size is mildly enlarged.  ? 3. Left atrial size was mildly dilated.  ? 4. Right atrial size was mildly dilated.  ? 5. The mitral valve is normal in structure. Mild mitral valve  ?regurgitation. No evidence of mitral stenosis.  ? 6. Mild tricuspid stenosis.  ? 7. The aortic valve is normal in structure.  Aortic valve regurgitation is  ?not visualized. Aortic valve sclerosis is present, with no evidence of  ?aortic valve stenosis.  ? 8. The inferior vena cava is normal in size with greater than 50%  ?respiratory variability, suggesting right atrial pressure of 3 mmHg. ? ?Patient Profile  ?   ?81 y.o. male with history of CKD stage III, liver transplant, PAD, HTN, and HLD who we are evaluating for acute HFrEF. ? ?Assessment & Plan  ?  ?1.  Acute HFrEF: ?-Appears mildly volume up ?-Diuretic holiday for today given acute on CKD ?-PTA bisoprolol ?-If renal function allows, look to add ARB or ARNI tomorrow in an effort to escalate GDMT ?-Defer addition of MRA at this time given acute  on CKD ?-Ischemic evaluation as outlined below ? ?2.  Coronary artery calcification with elevated high-sensitivity troponin: ?-Noted on CTA chest ?-Mildly elevated high-sensitivity troponin, flat trending, likely supply demand ischemia in the context of volume overload and underlying renal dysfunction ?-Ideally, we would proceed with a diagnostic R/LHC, though we will need to reassess the patient's symptoms and renal function trend prior to deciding whether to proceed with this or with a Lexiscan MPI to evaluate for high risk ischemia knowing that the stress test would at least be intermediate risk at baseline given his cardiomyopathy ? ?3.  Acute on CKD stage III: ?-Serum creatinine 2.24 today, possibly in the context of IV contrast received in the ED coupled with diuresis ?-Baseline serum creatinine appears to be around 1.6-1.8 ?-Diuretic holiday as outlined above with reassessment tomorrow ? ?4.  PAD: ?-PTA clopidogrel ?-Followed by vascular surgery ? ?5.  HLD: ?-LDL 152 with goal being at least less than 70 ?-Epic indicates he is intolerant to pravastatin, we will need to discuss this further with him in rounds tomorrow ?-May need to consider PCSK9 inhibitor ? ?6.  Hypomagnesemia: ?-Repleted via IV ? ?   ? ?For questions or updates,  please contact [place] ?Please consult [email or web address] for contact info under Cardiology/STEMI. ?  ? ?Signed, ?Christell Faith, PA-C ?[facility] ?Pager: [phone] ?02/07/2022, 1:12 PM ? ?

## 2022-02-07 NOTE — Assessment & Plan Note (Signed)
S/p transplant.  Stable. ?

## 2022-02-07 NOTE — Progress Notes (Signed)
Brittney, nursing student, here and gave AM cares and meds this shift from 0700-16:00 with direct supervision from this RN and Programme researcher, broadcasting/film/video. Patient alert and oriented. Denies pain. Reports improvement in shortness of breath. ? ? ?

## 2022-02-07 NOTE — Progress Notes (Addendum)
?Progress Note ? ? ?Patient: Brian Pacheco SWF:[id number] DOB: November 11, 1940 DOA: 02/05/2022     1 ?DOS: the patient was seen and examined on 02/07/2022 at 10 AM ?  ? ? ? ?Brief hospital course: ?Mr. Siler is a 81 y.o. 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   ? ?In the ER, CTA chest ruled out PE, showed bilateral edema, small effusions.  Troponin low elevation consistent with CHF flare.  Started on lasix and admitted for CHF. ? ? ? ? ?Assessment and Plan: ?* Acute on chronic combined systolic and diastolic CHF (congestive heart failure) ([place]) ?Net negative 2L on admission, Cr up and patient relates his dyspnea on exertion and orthopnea are resolved, and his swelling appears resolved on exam. ? ?EF appears to be reduced to 40-45%, this is new ?- Stop Lasix ?- Continue beta-blocker  ?- Consult cardiology, appreciate recommendation  ? ?Myocardial injury ?Elevated troponin, probably due to poor renal excretion in setting of CHF. ?- Consult Cardiology, appreciate cares ? ?Alcoholic cirrhosis of liver ([place]) ?S/p transplant.  Stable. ? ?Status post liver transplantation ([place]) ?-Continue MMF ? ?Immunosuppression ([place]) ?Due to liver transplant.  ?-Continue Cellcept ? ?Mood disorder ([place]) ? -Continue venlafaxine ? ?COPD, moderate ([place]) ?No symptoms, no wheezing  ?-Continue ICS/LABA/LAMA ? ?Stage 3b chronic kidney disease (CKD) ([place]) ?Baseline Cr 1.8-2.1.   ?Today up to 2.2. 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 ?- Trend Cr ? ?Controlled type 2 diabetes mellitus with diabetic nephropathy, with long-term current use of insulin ([place]) ?Glucose down to 54 overnight, then up to 200 this morning ?-Continue glargine and SS corrections, back off dose ? ?Peripheral arterial disease ([place]) ?Stents in bialteral iliacs, and s/p CEA on left ?-Continue Plavix,  fibrate ? ?Essential hypertension ?BP soft ?-Continue bisoprolol ?-Hold amlodipine, losartan ? ? ? ? ? ? ? ? ? ?Subjective: The patient's orthopnea is resolved, his swelling is resolved, his dyspnea on exertion is resolved, he feels at baseline.  He has some chronic low back pain from a "pinched disc" which limits his walking, otherwise but feels to baseline.  No confusion, fever.  No swelling, no dyspnea. ? ? ? ? ?Physical Exam: ?Vitals:  ? 02/07/22 0756 02/07/22 0815 02/07/22 1159 02/07/22 1326  ?BP: 122/64  137/78   ?Pulse: 80  78   ?Resp: 18  18   ?Temp: 98.4 ?F (36.9 ?C)  98.3 ?F (36.8 ?C)   ?TempSrc: Oral  Oral   ?SpO2: 97% 98% 99% 95%  ?Weight:      ?Height:      ? ?Obese adult male, sitting in the edge of the bed, interactive, appropriate ?RRR, no murmurs, minimal nonpitting lower extremity edema ?Normal respiratory rate and rhythm, lungs clear, without rales or wheezes ?Abdomen soft no tenderness palpation or guarding, no ascites or distention ?Attention normal, affect normal, judgment and insight.  Normal ? ?Data Reviewed: ?Discussed with cardiology, nursing notes reviewed, vital signs reviewed ?Patient metabolic panel notable for creatinine up to 2.2 ?Glucose is notable for hypoglycemia overnight, now back to hyperglycemia ?Echocardiogram notable for ejection fraction 40 to 22%, grade 2 diastolic dysfunction ? ?Family Communication:   ? ? ? ?Disposition: ?Status is: Inpatient ?The patient was admitted for swelling, orthopnea, paroxysmal nocturnal dyspnea, and dyspnea on exertion with imaging findings suggestive of congestive heart failure. ? ?He has diuresed  2 L, and he feels back to his baseline. ? ?Cardiology concern that he will need left heart cath, and would like to watch his creatinine for 24 more hours to make sure that this is not continuing to rise  ? ? ? ? ? ? ? ? ? ? ? ? ?Author: ?Edwin Dada, MD ?02/07/2022 6:52 PM ? ?For on call review [email or web address].  ? ? ?

## 2022-02-07 NOTE — Progress Notes (Signed)
Assumed care of pt at 1900. A&O x4. RA. No c/o pain, however, c/o itchiness on face. Red rash noted on face that pt reports he came in with, provider made aware. Hydrocortisone cream applied. Full assessment per flowsheets. Medication administration per MAR. Ambulating steadily around the room. Call bell within reach, pt making needs known. Comfort and safety maintained overnight.  ?

## 2022-02-07 NOTE — Evaluation (Signed)
Physical Therapy Evaluation ?Patient Details ?Name: Brian Pacheco ?MRN: [id number] ?DOB: 1940-12-07 ?Today's Date: 02/07/2022 ? ?History of Present Illness ? Pt is a 81 yo male that presented to the ED for SOB with exertion. Admitted for acute CHF. PMH of asthma, DM, CKDIII, spinal stenosis, HTN, liver transplant, COPD. ?  ?Clinical Impression ? Pt alert, sitting EOB at start of session. Pt reported at baseline he is independent, no falls, and lives with his wife in his son's walk out basement. ? ?The patient demonstrated transfers and ambulation independently. Two standing rest breaks led by PT during ~1107f ambulation to assess HR and spO2, WFLs. Pt did appear to have some very mild SOB towards end of ambulation. Pt endorsed at baseline he normally has difficulty with longer community ambulation and recreational activities due to back pain, and leg weakness, and more recently SOB. Pt educated on the potential benefit of outpatient PT or cardiac rehab, and he stated he would think on it. Overall the patient demonstrated ambulation and mobility to be functional for a discharge recommendation to home.    ?   ? ?Recommendations for follow up therapy are one component of a multi-disciplinary discharge planning process, led by the attending physician.  Recommendations may be updated based on patient status, additional functional criteria and insurance authorization. ? ?Follow Up Recommendations Outpatient PT ? ?  ?Assistance Recommended at Discharge None  ?Patient can return home with the following ?   ? ?  ?Equipment Recommendations None recommended by PT  ?Recommendations for Other Services ?    ?  ?Functional Status Assessment  (pt complains of decreased activity tolerance/endurance at baseline, which has worsened acutely)  ? ?  ?Precautions / Restrictions Restrictions ?Weight Bearing Restrictions: No  ? ?  ? ?Mobility ? Bed Mobility ?Overal bed mobility: Independent ?  ?  ?  ?  ?  ?  ?  ?  ? ?Transfers ?Overall  transfer level: Modified independent ?Equipment used: None ?  ?  ?  ?  ?  ?  ?  ?  ?  ? ?Ambulation/Gait ?Ambulation/Gait assistance: Independent ?Gait Distance (Feet): 170 Feet ?Assistive device: None ?  ?  ?  ?  ?  ? ?Stairs ?  ?  ?  ?  ?  ? ?Wheelchair Mobility ?  ? ?Modified Rankin (Stroke Patients Only) ?  ? ?  ? ?Balance Overall balance assessment: No apparent balance deficits (not formally assessed) ?  ?  ?  ?  ?  ?  ?  ?  ?  ?  ?  ?  ?  ?  ?  ?  ?  ?  ?   ? ? ? ?Pertinent Vitals/Pain Pain Assessment ?Pain Assessment: No/denies pain  ? ? ?Home Living Family/patient expects to be discharged to:: Private residence ?Living Arrangements: Spouse/significant other;Children ?Available Help at Discharge: Family ?Type of Home: House ?Home Access: Level entry ?  ?  ?  ?Home Layout: One level ?  ?Additional Comments: Pt now lives in basement of his sons home  ?  ?Prior Function Prior Level of Function : Independent/Modified Independent ?  ?  ?  ?  ?  ?  ?  ?  ?  ? ? ?Hand Dominance  ?   ? ?  ?Extremity/Trunk Assessment  ? Upper Extremity Assessment ?Upper Extremity Assessment: Overall WFL for tasks assessed ?  ? ?Lower Extremity Assessment ?Lower Extremity Assessment: Overall WFL for tasks assessed ?  ? ?Cervical / Trunk Assessment ?Cervical /  Trunk Assessment: Normal  ?Communication  ? Communication: No difficulties  ?Cognition Arousal/Alertness: Awake/alert ?Behavior During Therapy: [facility] for tasks assessed/performed ?Overall Cognitive Status: Within Functional Limits for tasks assessed ?  ?  ?  ?  ?  ?  ?  ?  ?  ?  ?  ?  ?  ?  ?  ?  ?  ?  ?  ? ?  ?General Comments   ? ?  ?Exercises    ? ?Assessment/Plan  ?  ?PT Assessment Patient needs continued PT services  ?PT Problem List Decreased activity tolerance ? ?   ?  ?PT Treatment Interventions Therapeutic activities;Gait training;Therapeutic exercise;Patient/family education;Functional mobility training;Balance training   ? ?PT Goals (Current goals can be found in the Care  Plan section)  ?Acute Rehab PT Goals ?Patient Stated Goal: to be able to do the things he wants to do with less SOB or fatigue ?PT Goal Formulation: With patient ?Time For Goal Achievement: 02/21/22 ?Potential to Achieve Goals: Good ?Additional Goals ?Additional Goal #1: The patient will ambulate >1070f in the 6MWT indicating unlimited community ambulation. ? ?  ?Frequency Min 2X/week ?  ? ? ?Co-evaluation   ?  ?  ?  ?  ? ? ?  ?AM-PAC PT "6 Clicks" Mobility  ?Outcome Measure Help needed turning from your back to your side while in a flat bed without using bedrails?: None ?Help needed moving from lying on your back to sitting on the side of a flat bed without using bedrails?: None ?Help needed moving to and from a bed to a chair (including a wheelchair)?: None ?Help needed standing up from a chair using your arms (e.g., wheelchair or bedside chair)?: None ?Help needed to walk in hospital room?: None ?Help needed climbing 3-5 steps with a railing? : None ?6 Click Score: 24 ? ?  ?End of Session   ?Activity Tolerance: Patient tolerated treatment well ?Patient left: Other (comment) (sitting EOB with breakfast) ?Nurse Communication: Mobility status ?PT Visit Diagnosis: Other abnormalities of gait and mobility (R26.89) ?  ? ?Time: [address]?PT Time Calculation (min) (ACUTE ONLY): 11 min ? ? ?Charges:   PT Evaluation ?$PT Eval Low Complexity: 1 Low ?  ?  ?   ? ?DLieutenant DiegoPT, DPT ?10:15 AM,02/07/22 ? ? ?

## 2022-02-07 NOTE — Assessment & Plan Note (Addendum)
Elevated troponin, probably due to poor renal excretion in setting of CHF. ?- Cardiology considering Myoview ?

## 2022-02-08 ENCOUNTER — Other Ambulatory Visit: Payer: Medicare Other

## 2022-02-08 DIAGNOSIS — E1121 Type 2 diabetes mellitus with diabetic nephropathy: Secondary | ICD-10-CM | POA: Diagnosis not present

## 2022-02-08 DIAGNOSIS — J449 Chronic obstructive pulmonary disease, unspecified: Secondary | ICD-10-CM | POA: Diagnosis not present

## 2022-02-08 DIAGNOSIS — I5043 Acute on chronic combined systolic (congestive) and diastolic (congestive) heart failure: Secondary | ICD-10-CM

## 2022-02-08 DIAGNOSIS — K7030 Alcoholic cirrhosis of liver without ascites: Secondary | ICD-10-CM | POA: Diagnosis not present

## 2022-02-08 LAB — GLUCOSE, CAPILLARY
Glucose-Capillary: 185 mg/dL — ABNORMAL HIGH (ref 70–99)
Glucose-Capillary: 205 mg/dL — ABNORMAL HIGH (ref 70–99)
Glucose-Capillary: 82 mg/dL (ref 70–99)
Glucose-Capillary: 167 mg/dL — ABNORMAL HIGH (ref 70–99)

## 2022-02-08 LAB — BASIC METABOLIC PANEL
Anion gap: 10 (ref 5–15)
BUN: 47 mg/dL — ABNORMAL HIGH (ref 8–23)
CO2: 25 mmol/L (ref 22–32)
Calcium: 9.4 mg/dL (ref 8.9–10.3)
Chloride: 98 mmol/L (ref 98–111)
Creatinine, Ser: 2.36 mg/dL — ABNORMAL HIGH (ref 0.61–1.24)
GFR, Estimated: 27 mL/min — ABNORMAL LOW (ref >60)
Glucose, Bld: 203 mg/dL — ABNORMAL HIGH (ref 70–99)
Potassium: 4.9 mmol/L (ref 3.5–5.1)
Sodium: 133 mmol/L — ABNORMAL LOW (ref 135–145)

## 2022-02-08 MED ORDER — ROSUVASTATIN CALCIUM 5 MG PO TABS
5.0000 mg | ORAL_TABLET | Freq: Every day | ORAL | Status: DC
  Administered 2022-02-09 – 2022-02-11 (×3): 5 mg via ORAL
  Filled 2022-02-08 (×3): qty 1

## 2022-02-08 MED ORDER — LORATADINE 10 MG PO TABS
10.0000 mg | ORAL_TABLET | Freq: Every day | ORAL | Status: DC
  Administered 2022-02-08 – 2022-02-11 (×4): 10 mg via ORAL
  Filled 2022-02-08 (×4): qty 1

## 2022-02-08 MED ORDER — BUDESONIDE 0.25 MG/2ML IN SUSP
0.2500 mg | Freq: Two times a day (BID) | RESPIRATORY_TRACT | Status: DC
  Administered 2022-02-08 – 2022-02-11 (×7): 0.25 mg via RESPIRATORY_TRACT
  Filled 2022-02-08 (×7): qty 2

## 2022-02-08 MED ORDER — FUROSEMIDE 10 MG/ML IJ SOLN
40.0000 mg | Freq: Once | INTRAMUSCULAR | Status: AC
  Administered 2022-02-08: 40 mg via INTRAVENOUS
  Filled 2022-02-08: qty 4

## 2022-02-08 MED ORDER — ENOXAPARIN SODIUM 40 MG/0.4ML IJ SOSY
40.0000 mg | PREFILLED_SYRINGE | INTRAMUSCULAR | Status: DC
  Administered 2022-02-09 – 2022-02-10 (×2): 40 mg via SUBCUTANEOUS
  Filled 2022-02-08 (×2): qty 0.4

## 2022-02-08 NOTE — Progress Notes (Signed)
? ? ?Progress Note ? ?Patient Name: Brian Pacheco ?Date of Encounter: 02/08/2022 ? ?Primary Cardiologist: New to [facility] - consult by N[place] ? ?Subjective  ? ?Noted some progression of SOB overnight and this morning. He feels like he is wheezing. No chest pain. Lower extremity swelling about the same. Breathing treatment this morning helped some. BUN/SCr slightly worse today.  ? ?Inpatient Medications  ?  ?Scheduled Meds: ? bisoprolol  5 mg Oral Daily  ? budesonide (PULMICORT) nebulizer solution  0.25 mg Nebulization BID  ? clopidogrel  75 mg Oral Daily  ? enoxaparin (LOVENOX) injection  0.5 mg/kg Subcutaneous Q24H  ? fenofibrate  160 mg Oral Daily  ? fluticasone furoate-vilanterol  1 puff Inhalation Daily  ? And  ? umeclidinium bromide  1 puff Inhalation Daily  ? insulin aspart  0-20 Units Subcutaneous TID WC  ? insulin glargine-yfgn  25 Units Subcutaneous Daily  ? ipratropium-albuterol  3 mL Nebulization TID  ? loratadine  10 mg Oral Daily  ? mycophenolate  250 mg Oral BID  ? pantoprazole  40 mg Oral Daily  ? tamsulosin  0.4 mg Oral Daily  ? venlafaxine  75 mg Oral TID  ? ?Continuous Infusions: ? ?PRN Meds: ?albuterol, hydrocortisone cream, melatonin, ondansetron (ZOFRAN) IV, phenol, polyethylene glycol  ? ?Vital Signs  ?  ?Vitals:  ? 02/08/22 0450 02/08/22 0717 02/08/22 0817 02/08/22 1214  ?BP:   136/70 (!) 144/76  ?Pulse:   86 (!) 109  ?Resp:   18 18  ?Temp:   97.6 ?F (36.4 ?C) 98.4 ?F (36.9 ?C)  ?TempSrc:      ?SpO2:  95% 97% 97%  ?Weight: 103.2 kg     ?Height:      ? ? ?Intake/Output Summary (Last 24 hours) at 02/08/2022 1238 ?Last data filed at 02/08/2022 0908 ?Gross per 24 hour  ?Intake 240 ml  ?Output 2000 ml  ?Net -1760 ml  ? ? ?Filed Weights  ? 02/05/22 2147 02/07/22 0332 02/08/22 0450  ?Weight: 90.7 kg 103.3 kg 103.2 kg  ? ? ?Telemetry  ?  ?Sinus rhythm with sinus tach and artifact - Personally Reviewed ? ?ECG  ?  ?No new tracings - Personally Reviewed ? ?Physical Exam  ? ?GEN: No acute distress.  Mildly  erythematous rash noted along the face and neck. ?Neck: No JVD. ?Cardiac: RRR, no murmurs, rubs, or gallops.  ?Respiratory: Mildly diminished breath sounds along the bases bilaterally.  ?GI: Soft, nontender, non-distended.   ?MS: No edema; No deformity. ?Neuro:  Alert and oriented x 3; Nonfocal.  ?Psych: Normal affect. ? ?Labs  ?  ?Chemistry ?Recent Labs  ?Lab 02/05/22 ?2150 02/06/22 ?0424 02/07/22 ?2025 02/08/22 ?4270  ?NA 141 136 137 133*  ?K 3.9 4.6 4.6 4.9  ?CL 103 99 98 98  ?CO2 '29 28 31 25  '$ ?GLUCOSE 210* 323* 128* 203*  ?BUN 26* 27* 37* 47*  ?CREATININE 1.63* 1.85* 2.24* 2.36*  ?CALCIUM 8.4* 8.8* 9.1 9.4  ?PROT 6.7 7.2  --   --   ?ALBUMIN 3.3* 3.5  --   --   ?AST 24 39  --   --   ?ALT 18 19  --   --   ?ALKPHOS 63 55  --   --   ?BILITOT 0.5 0.6  --   --   ?GFRNONAA 43* 37* 29* 27*  ?ANIONGAP '9 9 8 10  '$ ? ?  ? ?Hematology ?Recent Labs  ?Lab 02/05/22 ?2150 02/06/22 ?0424  ?WBC 9.2 7.9  ?RBC 4.26 4.21*  ?  HGB 12.9* 12.7*  ?HCT 40.1 39.5  ?MCV 94.1 93.8  ?MCH 30.3 30.2  ?MCHC 32.2 32.2  ?RDW 12.9 13.1  ?PLT 181 184  ? ? ? ?Cardiac EnzymesNo results for input(s): TROPONINI in the last 168 hours. No results for input(s): TROPIPOC in the last 168 hours.  ? ?BNP ?Recent Labs  ?Lab 02/05/22 ?2150  ?BNP 236.9*  ? ?  ? ?DDimer  ?Recent Labs  ?Lab 02/05/22 ?2150  ?DDIMER 0.82*  ? ?  ? ?Radiology  ?  ?DG Chest 2 View ? ?Result Date: 02/05/2022 ?IMPRESSION: 1. Mild diffuse increased interstitial opacities suggestive of mild interstitial edema or inflammatory process. Probable tiny bilateral effusions. Electronically Signed   By: Donavan Foil M.D.   On: 02/05/2022 22:14  ? ?CT Angio Chest PE W and/or Wo Contrast ? ?Result Date: 02/05/2022 ?IMPRESSION: 1. No evidence of pulmonary embolism. 2. Mild interstitial edema with mild bibasilar atelectasis and/or infiltrate. 3. Very mild, slightly nodular appearing right middle lobe infiltrate which may represent sequelae associated with an atypical infectious process. Follow-up to  resolution is recommended. 4. Small bilateral pleural effusions. 5. Mild AP window, paratracheal and right hilar lymphadenopathy, likely reactive. 6. Moderate severity coronary artery disease. 7. 6 mm anterior left upper lobe and 4 mm right upper lobe noncalcified lung nodules. Non-contrast chest CT at 3-6 months is recommended. If the nodules are stable at time of repeat CT, then future CT at 18-24 months (from today's scan) is considered optional for low-risk patients, but is recommended for high-risk patients. This recommendation follows the consensus statement: Guidelines for Management of Incidental Pulmonary Nodules Detected on CT Images: From the Fleischner Society 2017; Radiology 2017; 284:228-243. Aortic Atherosclerosis (ICD10-I70.0). Electronically Signed   By: Virgina Norfolk M.D.   On: 02/05/2022 23:39  ? ?US Venous Img Lower Bilateral (DVT) ? ?Result Date: 02/06/2022 ?IMPRESSION: 1. No evidence of deep venous thrombosis in either lower extremity. . Electronically Signed   By: Vinnie Langton M.D.   On: 02/06/2022 07:49  ? ?Cardiac Studies  ? ?2D echo 02/06/2022: ?1. Left ventricular ejection fraction, by estimation, is 40 to 45%. The  ?left ventricle has mildly decreased function. The left ventricle  ?demonstrates global hypokinesis. There is mild concentric left ventricular  ?hypertrophy. Left ventricular diastolic  ?parameters are consistent with Grade III diastolic dysfunction  ?(restrictive).  ? 2. Right ventricular systolic function is normal. The right ventricular  ?size is mildly enlarged.  ? 3. Left atrial size was mildly dilated.  ? 4. Right atrial size was mildly dilated.  ? 5. The mitral valve is normal in structure. Mild mitral valve  ?regurgitation. No evidence of mitral stenosis.  ? 6. Mild tricuspid stenosis.  ? 7. The aortic valve is normal in structure. Aortic valve regurgitation is  ?not visualized. Aortic valve sclerosis is present, with no evidence of  ?aortic valve stenosis.  ? 8.  The inferior vena cava is normal in size with greater than 50%  ?respiratory variability, suggesting right atrial pressure of 3 mmHg. ? ?Patient Profile  ?   ?81 y.o. male with history of CKD stage III, liver transplant, PAD, HTN, and HLD who we are evaluating for acute HFrEF. ? ?Assessment & Plan  ?  ?1.  Acute HFrEF: ?-Echo report with an EF of 40-45%, by our interpretation, this appears to be closer to 50%, maybe 55% ?-Appears volume up ?-One-time dose of IV Lasix 40 mg and assess UOP/SCr ?-PTA bisoprolol ?-If renal function allows, look to add  ARB or ARNI in an effort to escalate GDMT ?-Defer addition of MRA at this time given acute on CKD ?-Will need ischemic evaluation as outlined below ? ?2.  Coronary artery calcification with elevated high-sensitivity troponin: ?-Noted on CTA chest ?-Mildly elevated high-sensitivity troponin, flat trending, likely supply demand ischemia in the context of volume overload and underlying renal dysfunction ?-Ideally, we would proceed with a diagnostic R/LHC, though we will need to reassess the patient's symptoms and renal function trend prior to deciding whether to proceed with this or with a Lexiscan MPI to evaluate for high risk ischemia knowing that the stress test would at least be intermediate risk at baseline given his cardiomyopathy ?-PTA Plavix as outlined below ?-Intolerant to statins, recommend PCSK9i as an outpatient  ? ?3.  Acute on CKD stage III: ?-Serum creatinine 2.36 today, possibly in the context of IV contrast received in the ED coupled with diuresis ?-Baseline serum creatinine appears to be around 1.6-1.8 ?-He does appear ot be volume up as above, will give a one-time dose of IV Lasix ? ?4.  PAD: ?-PTA clopidogrel ?-Followed by vascular surgery ? ?5.  HLD: ?-LDL 152 with goal being at least less than 70 ?-Epic indicates he is intolerant to pravastatin, we will need to discuss this further with him in rounds tomorrow ?-Consider PCSK9 inhibitor as an outpatient   ? ?6.  Hypomagnesemia: ?-Repleted via IV ? ?   ? ?For questions or updates, please contact [place] ?Please consult [email or web address] for contact info under Cardiology/STEMI. ?  ? ?Signed, ?Christell Faith, PA-C ?[facility]

## 2022-02-08 NOTE — Progress Notes (Signed)
?Progress Note ? ? ?Patient: Brian Pacheco TML:[id number] DOB: 03/30/1941 DOA: 02/05/2022     2 ?DOS: the patient was seen and examined on 02/08/2022 at 10:47AM ?  ? ? ? ?Brief hospital course: ?Mr. Grieshop is a 81 y.o. 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   ? ?In the ER, CTA chest ruled out PE, showed bilateral edema, small effusions.  Troponin low elevation consistent with CHF flare.  Started on lasix and admitted for CHF. ? ? ?4/9: Admitted on Lasix ?4/10: Symptoms resolved, Cardiology stopped Lasix, recommended ischemic work up ?4/11: Dyspnea and wheezing returned, Cardiology recommended resumption of diuresis; inhaled steroids increased ? ? ? ? ?Assessment and Plan: ?* Acute on chronic combined systolic and diastolic CHF (congestive heart failure) ([place]) ?500cc net negative documented yesterday off diuretic, negative 3.5L on admisison.   ?Cr stable at 2.2, within his baseline range.   ? ?Overnight, patient with recurrent DOE and wheezing. ? ?Cardiology re-reviewed echo, and feel like his EF may actually be normal.   ?- Lasix per Cardiology ?- Continue beta-blocker  ?- Consult cardiology, appreciate recommendation  ? ? ? ? ?COPD, moderate ([place]) ?This was not initially supsected, but now suspect there may be some exacerbation here. ?Wheezing today. 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  This may now be flaring, but not bad enough for steroids (given his brittle sugars) ?-Continue ICS/LABA/LAMA  ?- Add Pulmicort ?- Add Claritin ?- Increase Trelegy dose at discharge ?- Pulm follow up at discharge  ? ?Myocardial injury ?Elevated troponin, probably due to poor renal excretion in setting of CHF. ?- Consult Cardiology, appreciate cares ? ?Alcoholic cirrhosis of liver ([place]) ?S/p transplant.   Stable. ? ?Status post liver transplantation ([place]) ?-Continue MMF ? ?Immunosuppression ([place]) ?Due to liver transplant.  ?-Continue Cellcept ? ?Mood disorder ([place]) ? -Continue venlafaxine ? ?Stage 3b chronic kidney disease (CKD) ([place]) ?Baseline Cr 1.8-2.1.   ?Was up to 2.2 with diruesis.  Today stable at 2.1 ?I have low suspicion for CIN, but overall am concerned about his renal function with diuresis, given his body habitus which precludes accurate volume assessment ?- Hold diuretics and nephrotoxins. ?- Trend Cr ?- May defer losartan at d/c unless Nephrology follow up ? ?Controlled type 2 diabetes mellitus with diabetic nephropathy, with long-term current use of insulin ([place]) ?Glucoses high normal.  Were down to 50s overnight two nights ago with higher doses insulin. ?-Continue glargine and SS corrections ? ?Peripheral arterial disease ([place]) ?Stents in bialteral iliacs, and s/p CEA on left ?-Continue Plavix, fibrate ? ?Essential hypertension ?BP normal ?-Continue bisoprolol ?-Hold amlodipine, resume at d/c ?-Hold losartan given renal function ? ? ? ? ? ? ? ? ? ?Subjective: Patient with wheezing and dyspnea on exertion overnight.  He does describe this like PND, but on my exam he is laying flat no problem, and wheezing.    ? ?No fever, sputum.  No confusion. ? ? ? ? ?Physical Exam: ?Vitals:  ? 02/08/22 0450 02/08/22 0717 02/08/22 0817 02/08/22 1214  ?BP:   136/70 (!) 144/76  ?Pulse:   86 (!) 109  ?Resp:   18 18  ?Temp:   97.6 ?F (36.4 ?C) 98.4 ?F (36.9 ?C)  ?TempSrc:      ?SpO2:  95% 97% 97%  ?Weight:  103.2 kg     ?Height:      ? ?Obese adult male, lying in bed, sleeping, no obvious distress, mentation good, oropharynx moist, no oral lesions, hearing normal. ?RRR, no murmurs, no pitting lower extremity edema although he has mild nonpitting puffiness of the both legs, JVP not visible, abdomen large, soft, no obvious edema there. ?Lung sounds with scant wheezing with expiration, good air movement, no increased  respiratory effort ?Attention normal, affect normal, judgment and insight appear normal ? ? ? ? ? ?Data Reviewed: ?Discussed with cardiology, nursing notes reviewed, vital signs reviewed ?Creatinine stable at 2.36 ?Glucose good ?K normal ? ? ?Family Communication:   ? ? ? ?Disposition: ?Status is: Inpatient ?The patient presented with radiographic signs of fluid overload, orthopnea and dyspnea.  His symptoms wax and wane despite diuresis, we cannot seem to get clear improvement in his condition with diuretics alone. ? ?I will add inhaled steroids, and more frequent bronchodilators, and the suspicion that this is COPD/allergy. ? ? ?When his renal function stabilizes over the next 1 to 2 days, cardiology have a plan in place for ischemic work-up and diuretics, we may be able to discharge home ? ? ? ? ? ? ? ? ? ?Author: ?Edwin Dada, MD ?02/08/2022 12:16 PM ? ?For on call review [email or web address].  ? ? ?

## 2022-02-08 NOTE — TOC Progression Note (Signed)
Transition of Care (TOC) - Progression Note  ? ? ?Patient Details  ?Name: Brian Pacheco ?MRN: [id number] ?Date of Birth: 07/16/1941 ? ?Transition of Care (TOC) CM/SW Contact  ?Preesha Benjamin A Claudell Wohler, LCSW ?Phone Number: ?02/08/2022, 11:56 AM ? ?Clinical Narrative:   Outpatient signed by MD and CSW faxed to [facility]. ? ? ? ?  ?  ? ?Expected Discharge Plan and Services ?  ?  ?  ?  ?  ?                ?  ?  ?  ?  ?  ?  ?  ?  ?  ?  ? ? ?Social Determinants of Health (SDOH) Interventions ?  ? ?Readmission Risk Interventions ?   ? View : No data to display.  ?  ?  ?  ? ? ?

## 2022-02-08 NOTE — Progress Notes (Signed)
PT Cancellation Note ? ?Patient Details ?Name: Brian Pacheco ?MRN: [id number] ?DOB: 1941-03-20 ? ? ?Cancelled Treatment:    Reason Eval/Treat Not Completed: Other (comment). Pt mid lunch upon PT arrival, and then noted to be up ambulating with RN around loop. PT to re-attempt as able.  ? ?Lieutenant Diego PT, DPT ?2:28 PM,02/08/22 ? ?

## 2022-02-08 NOTE — TOC Progression Note (Signed)
Transition of Care (TOC) - Progression Note  ? ? ?Patient Details  ?Name: Brian Pacheco ?MRN: [id number] ?Date of Birth: [birth year] ? ?Transition of Care (TOC) CM/SW Contact  ?Icarus Partch A Trystyn Sitts, LCSW ?Phone Number: ?02/08/2022, 4:14 PM ? ?Clinical Narrative:   Pt does not want outpatient PT. Pt is agreeable to [facility]. [facility] will service pt at d/c.  ? ? ? ?  ?  ? ?Expected Discharge Plan and Services ?  ?  ?  ?  ?  ?                ?  ?  ?  ?  ?  ?  ?  ?  ?  ?  ? ? ?Social Determinants of Health (SDOH) Interventions ?  ? ?Readmission Risk Interventions ?   ? View : No data to display.  ?  ?  ?  ? ? ?

## 2022-02-08 NOTE — Clinical Social Work Note (Signed)
Occupational Therapy * Physical Therapy * Speech Therapy ?       ? ? ?DATE ___4/11/23________________ ?PATIENT NAME__[name]_______ ?PATIENT MRN__[id number]______________ ? ?DIAGNOSIS/DIAGNOSIS CODE _Acute on chronic combined systolic and diastolic CHF (congestive heart failure) (HCC) I50.43_____________________ ? ?DATE OF DISCHARGE: __04/12/23__________ ? ?PRIMARY CARE PHYSICIAN __Marcus Babaoff__ ? ?PCP PHONE/FAX__[phone]_________________________ ? ?  ? ?Dear Provider (Name: __________________   ?Fax: ___________________________): ?  ?I certify that I have examined this patient and that occupational/physical/speech therapy is necessary on an outpatient basis.   ? ?The patient has expressed interest in completing their recommended course of therapy at your location.  Once a formal order from the patient's primary care physician has been obtained, please contact him/her to schedule an appointment for evaluation at your earliest convenience. ? ? ?[ X ]  Physical Therapy Evaluate and Treat ? ?[  ]  Occupational Therapy Evaluate and Treat ? ?  [  ]  Speech Therapy Evaluate and Treat ? ? ? ? ? ? ?The patient's primary care physician (listed above) must furnish and be responsible for a formal order such that the recommended services may be furnished while under the primary physician's care, and that the plan of care will be established and reviewed every 30 days (or more often if condition necessitates).  ? ?MD electronic signature noted below ?

## 2022-02-08 NOTE — Progress Notes (Signed)
Inpatient Diabetes Program Recommendations ? ?AACE/ADA: New Consensus Statement on Inpatient Glycemic Control (2015) ? ?Target Ranges:  Prepandial:   less than 140 mg/dL ?     Peak postprandial:   less than 180 mg/dL (1-2 hours) ?     Critically ill patients:  140 - 180 mg/dL  ? ? Latest Reference Range & Units 02/07/22 08:20 02/07/22 11:55 02/07/22 12:28 02/07/22 16:17 02/07/22 20:09  ?Glucose-Capillary 70 - 99 mg/dL 136 (H) ? ?3 units Novolog ? ?25 units Semglee ? 247 (H) 211 (H) ? ?7 units Novolog ? 154 (H) ? ?4 units Novolog ? 155 (H)  ?(H): Data is abnormally high ? Latest Reference Range & Units 02/08/22 08:18 02/08/22 12:14  ?Glucose-Capillary 70 - 99 mg/dL 185 (H) ? ?4 units Novolog ? ?25 units Semglee ? 205 (H) ? ?7 units Novolog ?  ?(H): Data is abnormally high ? ? ?Home DM Meds: Glipizide 5 mg BID ?   Metformin 500 mg BID  ?   (Novolog & Lantus listed but pt NOT taking currently)  ? ?Current Orders: Semglee 25 units daily ?Novolog 0-20 units TID ? ? ? ?MD- Note afternoon CBGs elevated.  Please consider starting low dose Novolog Meal Coverage: ? ?Novolog 3 units TID with meals ?HOLD if pt eats <50% meals ? ? ? ?--Will follow patient during hospitalization-- ? ?Wyn Quaker RN, MSN, CDE ?Diabetes Coordinator ?Inpatient Glycemic Control Team ?Team Pager: [phone] (8a-5p) ? ?

## 2022-02-09 DIAGNOSIS — E1121 Type 2 diabetes mellitus with diabetic nephropathy: Secondary | ICD-10-CM | POA: Diagnosis not present

## 2022-02-09 DIAGNOSIS — J449 Chronic obstructive pulmonary disease, unspecified: Secondary | ICD-10-CM | POA: Diagnosis not present

## 2022-02-09 DIAGNOSIS — I5023 Acute on chronic systolic (congestive) heart failure: Secondary | ICD-10-CM | POA: Diagnosis not present

## 2022-02-09 DIAGNOSIS — K7030 Alcoholic cirrhosis of liver without ascites: Secondary | ICD-10-CM | POA: Diagnosis not present

## 2022-02-09 DIAGNOSIS — I2510 Atherosclerotic heart disease of native coronary artery without angina pectoris: Secondary | ICD-10-CM

## 2022-02-09 LAB — GLUCOSE, CAPILLARY
Glucose-Capillary: 167 mg/dL — ABNORMAL HIGH (ref 70–99)
Glucose-Capillary: 218 mg/dL — ABNORMAL HIGH (ref 70–99)
Glucose-Capillary: 153 mg/dL — ABNORMAL HIGH (ref 70–99)
Glucose-Capillary: 159 mg/dL — ABNORMAL HIGH (ref 70–99)

## 2022-02-09 LAB — CBC
HCT: 39.7 % (ref 39.0–52.0)
Hemoglobin: 13.1 g/dL (ref 13.0–17.0)
MCH: 30.7 pg (ref 26.0–34.0)
MCHC: 33 g/dL (ref 30.0–36.0)
MCV: 93 fL (ref 80.0–100.0)
Platelets: 203 10*3/uL (ref 150–400)
RBC: 4.27 MIL/uL (ref 4.22–5.81)
RDW: 13.1 % (ref 11.5–15.5)
WBC: 8.7 10*3/uL (ref 4.0–10.5)
nRBC: 0 % (ref 0.0–0.2)

## 2022-02-09 LAB — BASIC METABOLIC PANEL
Anion gap: 8 (ref 5–15)
BUN: 50 mg/dL — ABNORMAL HIGH (ref 8–23)
CO2: 29 mmol/L (ref 22–32)
Calcium: 9.3 mg/dL (ref 8.9–10.3)
Chloride: 99 mmol/L (ref 98–111)
Creatinine, Ser: 2.46 mg/dL — ABNORMAL HIGH (ref 0.61–1.24)
GFR, Estimated: 26 mL/min — ABNORMAL LOW (ref >60)
Glucose, Bld: 145 mg/dL — ABNORMAL HIGH (ref 70–99)
Potassium: 4.5 mmol/L (ref 3.5–5.1)
Sodium: 136 mmol/L (ref 135–145)

## 2022-02-09 NOTE — Progress Notes (Signed)
Physical Therapy Treatment ?Patient Details ?Name: Brian Pacheco ?MRN: [id number] ?DOB: Aug 21, 1941 ?Today's Date: 02/09/2022 ? ? ?History of Present Illness Pt is a 81 yo male that presented to the ED for SOB with exertion. Admitted for acute CHF. PMH of asthma, DM, CKDIII, spinal stenosis, HTN, liver transplant, COPD. ? ?  ?PT Comments  ? ? Patient alert, agreeable to PT session. The patient performed bed mobility independently, transferred independently, and ambulated independently. He was able to walk the loop, and then a PT mandated rest break prior to further ambulation due to chronic back/hip pain. Overall he continues to move very well, PT and pt discussed potential benefit of rollator for [place] ambulation. The patient would benefit from further skilled PT intervention to continue to progress towards goals. Recommendation remains appropriate.  ? ? ?   ?Recommendations for follow up therapy are one component of a multi-disciplinary discharge planning process, led by the attending physician.  Recommendations may be updated based on patient status, additional functional criteria and insurance authorization. ? ?Follow Up Recommendations ? Outpatient PT ?  ?  ?Assistance Recommended at Discharge None  ?Patient can return home with the following   ?  ?Equipment Recommendations ? None recommended by PT  ?  ?Recommendations for Other Services   ? ? ?  ?Precautions / Restrictions Precautions ?Precautions: Fall ?Restrictions ?Weight Bearing Restrictions: No  ?  ? ?Mobility ? Bed Mobility ?Overal bed mobility: Independent ?  ?  ?  ?  ?  ?  ?  ?  ? ?Transfers ?Overall transfer level: Modified independent ?Equipment used: None ?  ?  ?  ?  ?  ?  ?  ?  ?  ? ?Ambulation/Gait ?Ambulation/Gait assistance: Independent ?Gait Distance (Feet):  (157f, and then additional 1010f ?Assistive device: None ?  ?  ?  ?  ?  ? ? ?Stairs ?  ?  ?  ?  ?  ? ? ?Wheelchair Mobility ?  ? ?Modified Rankin (Stroke Patients Only) ?  ? ? ?   ?Balance Overall balance assessment: No apparent balance deficits (not formally assessed) ?  ?  ?  ?  ?  ?  ?  ?  ?  ?  ?  ?  ?  ?  ?  ?  ?  ?  ?  ? ?  ?Cognition Arousal/Alertness: Awake/alert ?[facility]/performed ?Overall Cognitive Status: Within Functional Limits for tasks assessed ?  ?  ?  ?  ?  ?  ?  ?  ?  ?  ?  ?  ?  ?  ?  ?  ?  ?  ?  ? ?  ?Exercises   ? ?  ?General Comments   ?  ?  ? ?Pertinent Vitals/Pain Pain Assessment ?Pain Assessment: No/denies pain (initially denies pain, but with walking references chronic back/leg/hip pain)  ? ? ?Home Living   ?  ?  ?  ?  ?  ?  ?  ?  ?  ?   ?  ?Prior Function    ?  ?  ?   ? ?PT Goals (current goals can now be found in the care plan section) Progress towards PT goals: Progressing toward goals ? ?  ?Frequency ? ? ? Min 2X/week ? ? ? ?  ?PT Plan Current plan remains appropriate  ? ? ?Co-evaluation   ?  ?  ?  ?  ? ?  ?AM-PAC PT "6 Clicks" Mobility   ?  Outcome Measure ? Help needed turning from your back to your side while in a flat bed without using bedrails?: None ?Help needed moving from lying on your back to sitting on the side of a flat bed without using bedrails?: None ?Help needed moving to and from a bed to a chair (including a wheelchair)?: None ?Help needed standing up from a chair using your arms (e.g., wheelchair or bedside chair)?: None ?Help needed to walk in hospital room?: None ?Help needed climbing 3-5 steps with a railing? : None ?6 Click Score: 24 ? ?  ?End of Session   ?Activity Tolerance: Patient tolerated treatment well ?Patient left: Other (comment) (sitting EOB) ?Nurse Communication: Mobility status ?PT Visit Diagnosis: Other abnormalities of gait and mobility (R26.89) ?  ? ? ?Time: 4196-2229 ?PT Time Calculation (min) (ACUTE ONLY): 14 min ? ?Charges:  $Therapeutic Exercise: 8-22 mins          ?          ?Lieutenant Diego PT, DPT ?10:15 AM,02/09/22 ? ?

## 2022-02-09 NOTE — Progress Notes (Signed)
? ?Progress Note ? ?Patient Name: Brian Pacheco ?Date of Encounter: 02/09/2022 ? ?[facility] Cardiologist: None  ? ?Subjective  ? ?Shortness of breath much improved after receiving Lasix yesterday.  Able to lay flat without any breathing issues. ? ?Inpatient Medications  ?  ?Scheduled Meds: ? bisoprolol  5 mg Oral Daily  ? budesonide (PULMICORT) nebulizer solution  0.25 mg Nebulization BID  ? clopidogrel  75 mg Oral Daily  ? enoxaparin (LOVENOX) injection  40 mg Subcutaneous Q24H  ? fenofibrate  160 mg Oral Daily  ? fluticasone furoate-vilanterol  1 puff Inhalation Daily  ? And  ? umeclidinium bromide  1 puff Inhalation Daily  ? insulin aspart  0-20 Units Subcutaneous TID WC  ? insulin glargine-yfgn  25 Units Subcutaneous Daily  ? ipratropium-albuterol  3 mL Nebulization TID  ? loratadine  10 mg Oral Daily  ? mycophenolate  250 mg Oral BID  ? pantoprazole  40 mg Oral Daily  ? rosuvastatin  5 mg Oral Daily  ? tamsulosin  0.4 mg Oral Daily  ? venlafaxine  75 mg Oral TID  ? ?Continuous Infusions: ? ?PRN Meds: ?albuterol, hydrocortisone cream, melatonin, ondansetron (ZOFRAN) IV, phenol, polyethylene glycol  ? ?Vital Signs  ?  ?Vitals:  ? 02/09/22 0713 02/09/22 0805 02/09/22 1153 02/09/22 1410  ?BP:  126/76 132/85   ?Pulse:  79 (!) 105   ?Resp:  17 20   ?Temp:  97.9 ?F (36.6 ?C) 98 ?F (36.7 ?C)   ?TempSrc:      ?SpO2: 95% 91% 96% 93%  ?Weight:      ?Height:      ? ? ?Intake/Output Summary (Last 24 hours) at 02/09/2022 1538 ?Last data filed at 02/09/2022 1300 ?Gross per 24 hour  ?Intake 960 ml  ?Output 1800 ml  ?Net -840 ml  ? ? ?  02/09/2022  ?  3:16 AM 02/08/2022  ?  4:50 AM 02/07/2022  ?  3:32 AM  ?Last 3 Weights  ?Weight (lbs) 220 lb 0.3 oz 227 lb 8.2 oz 227 lb 11.8 oz  ?Weight (kg) 99.8 kg 103.2 kg 103.3 kg  ?   ? ?Telemetry  ?  ?EKG shows sinus rhythm- Personally Reviewed ? ?ECG  ?  ? - Personally Reviewed ? ?Physical Exam  ? ?GEN: No acute distress.   ?Neck: No JVD ?Cardiac: RRR, no murmurs, rubs, or gallops.   ?Respiratory: Clear to auscultation bilaterally. ?GI: Soft, nontender, distended  ?MS: No edema; No deformity. ?Neuro:  Nonfocal  ?Psych: Normal affect  ? ?Labs  ?  ?High Sensitivity Troponin:   ?Recent Labs  ?Lab 02/05/22 ?2150 02/05/22 ?2351 02/06/22 ?0424 02/06/22 ?5093  ?TROPONINIHS 88* 109* 137* 143*  ?   ?Chemistry ?Recent Labs  ?Lab 02/05/22 ?2150 02/06/22 ?0424 02/07/22 ?2671 02/08/22 ?2458 02/09/22 ?0998  ?NA 141 136 137 133* 136  ?K 3.9 4.6 4.6 4.9 4.5  ?CL 103 99 98 98 99  ?CO2 '29 28 31 25 29  '$ ?GLUCOSE 210* 323* 128* 203* 145*  ?BUN 26* 27* 37* 47* 50*  ?CREATININE 1.63* 1.85* 2.24* 2.36* 2.46*  ?CALCIUM 8.4* 8.8* 9.1 9.4 9.3  ?MG  --  1.3*  --   --   --   ?PROT 6.7 7.2  --   --   --   ?ALBUMIN 3.3* 3.5  --   --   --   ?AST 24 39  --   --   --   ?ALT 18 19  --   --   --   ?  ALKPHOS 63 55  --   --   --   ?BILITOT 0.5 0.6  --   --   --   ?GFRNONAA 43* 37* 29* 27* 26*  ?ANIONGAP '9 9 8 10 8  '$ ?  ?Lipids  ?Recent Labs  ?Lab 02/06/22 ?0424  ?CHOL 219*  ?TRIG 134  ?HDL 40*  ?LDLCALC 152*  ?CHOLHDL 5.5  ?  ?Hematology ?Recent Labs  ?Lab 02/05/22 ?2150 02/06/22 ?0424 02/09/22 ?0315  ?WBC 9.2 7.9 8.7  ?RBC 4.26 4.21* 4.27  ?HGB 12.9* 12.7* 13.1  ?HCT 40.1 39.5 39.7  ?MCV 94.1 93.8 93.0  ?MCH 30.3 30.2 30.7  ?MCHC 32.2 32.2 33.0  ?RDW 12.9 13.1 13.1  ?PLT 181 184 203  ? ?Thyroid  ?Recent Labs  ?Lab 02/06/22 ?0424  ?TSH 0.524  ?  ?BNP ?Recent Labs  ?Lab 02/05/22 ?2150  ?BNP 236.9*  ?  ?DDimer  ?Recent Labs  ?Lab 02/05/22 ?2150  ?DDIMER 0.82*  ?  ? ?Radiology  ?  ?No results found. ? ?Cardiac Studies  ? ?TTE 02/06/2022 ?1. Left ventricular ejection fraction, by estimation, is 40 to 45%. The  ?left ventricle has mildly decreased function. The left ventricle  ?demonstrates global hypokinesis. There is mild concentric left ventricular  ?hypertrophy. Left ventricular diastolic  ?parameters are consistent with Grade III diastolic dysfunction  ?(restrictive).  ? 2. Right ventricular systolic function is normal. The right  ventricular  ?size is mildly enlarged.  ? 3. Left atrial size was mildly dilated.  ? 4. Right atrial size was mildly dilated.  ? 5. The mitral valve is normal in structure. Mild mitral valve  ?regurgitation. No evidence of mitral stenosis.  ? 6. Mild tricuspid stenosis.  ? 7. The aortic valve is normal in structure. Aortic valve regurgitation is  ?not visualized. Aortic valve sclerosis is present, with no evidence of  ?aortic valve stenosis.  ? 8. The inferior vena cava is normal in size with greater than 50%  ?respiratory variability, suggesting right atrial pressure of 3 mmHg.  ? ?Patient Profile  ?   ?81 y.o. male with history of CKD 3, PAD, liver transplant, hypertension, hyperlipidemia presenting with worsening shortness of breath and chest pain.  Being seen for CHF exacerbation. ? ?Assessment & Plan  ?  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 ?-Avoid nephrotoxins.  Continue bisoprolol ?-Will likely need oral Lasix upon discharge ? ?2.  CAD, coronary calcifications noted on chest CT ?-Denies chest pain ?-Continue Plavix, Crestor ?-Myoview tomorrow ? ? Total encounter time more than 50 minutes ? Greater than 50% was spent in counseling and coordination of care with the patient ? ?   ?Signed, ?Kate Sable, MD  ?02/09/2022, 3:38 PM    ?

## 2022-02-09 NOTE — Progress Notes (Signed)
?Progress Note ? ? ?Patient: Brian Pacheco TKW:[id number] DOB: August 02, 1941 DOA: 02/05/2022     3 ?DOS: the patient was seen and examined on 02/09/2022 ?  ?Brief hospital course: ?[name] is a 81 y.o. 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   ? ?In the ER, CTA chest ruled out PE, showed bilateral edema, small effusions.  Troponin low elevation consistent with CHF flare.  Started on lasix and admitted for CHF. ? ? ?4/9: Admitted on Lasix ?4/10: Symptoms resolved, Cardiology stopped Lasix, recommended ischemic work up ?4/11: Dyspnea and wheezing returned, Cardiology recommended resumption of diuresis; inhaled steroids increased ?4/12: Cardio planning stress test maybe tomorrow ? ? ?Assessment and Plan: ?* Acute on chronic HFrEF (heart failure with reduced ejection fraction) ([place]) ?Net IO Since Admission: -5,920 mL [02/09/22 1327] ? ?Cardiology re-reviewed echo, and feel like his EF may be close to 50% ?- Likely stress dose before discharge per cardiology ?- Continue beta-blocker  ? ? ? ?COPD, moderate ([place]) ?This was not initially supsected, but now suspect there may be some exacerbation here. ? 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  This may now be flaring, but not bad enough for steroids (given his brittle sugars) ?-Continue ICS/LABA/LAMA  ?- Continue Pulmicort and Claritin ?- Increase Trelegy dose at discharge ?- Pulm follow up at discharge  ? ?Myocardial injury ?Elevated troponin, probably due to poor renal excretion in setting of CHF. ?- Cardiology considering Myoview ? ?Alcoholic cirrhosis of liver ([place]) ?S/p transplant.  Stable. ? ?Status post liver transplantation ([place]) ?-Continue MMF ? ?Immunosuppression ([place]) ?Due to liver transplant.  ?-Continue Cellcept ? ?Mood disorder ([place]) ?  -Continue venlafaxine ? ?Stage 3b chronic kidney disease (CKD) ([place]) ?Baseline Cr 1.8-2.1.   ?Lab Results  ?Component Value Date  ? CREATININE 2.46 (H) 02/09/2022  ? CREATININE 2.36 (H) 02/08/2022  ? CREATININE 2.24 (H) 02/07/2022  ? ?-Consult nephrology ?- Hold diuretics and nephrotoxins. ?- Trend Cr ? ? ?Controlled type 2 diabetes mellitus with diabetic nephropathy, with long-term current use of insulin ([place]) ?-Continue glargine and SS corrections ? ?Peripheral arterial disease ([place]) ?Stents in bialteral iliacs, and s/p CEA on left ?-Continue Plavix, fibrate ? ?Essential hypertension ?BP normal ?-Continue bisoprolol ?-Hold amlodipine, resume at d/c ?-Hold losartan given renal function ? ? ? ? ?  ? ?Subjective: Denies any new complaints.  Hoping to get his heart checked out ? ?Physical Exam: ?Vitals:  ? 02/09/22 0316 02/09/22 0713 02/09/22 0805 02/09/22 1153  ?BP: 134/83  126/76 132/85  ?Pulse: 78  79 (!) 105  ?Resp: '20  17 20  '$ ?Temp: 97.8 ?F (36.6 ?C)  97.9 ?F (36.6 ?C) 98 ?F (36.7 ?C)  ?TempSrc: Oral     ?SpO2: 96% 95% 91% 96%  ?Weight: 99.8 kg     ?Height:      ? ?Obese adult male, lying in bed, sleeping, no obvious distress, mentation good, oropharynx moist, no oral lesions, hearing normal. ?RRR, no murmurs, no pitting lower extremity edema although he has mild nonpitting puffiness of the both legs, JVP not visible, abdomen large, soft, no obvious edema there. ?Lung sounds with scant wheezing with expiration, good air movement, no increased respiratory effort ?Attention normal, affect normal, judgment and insight appear normal ? ?Data Reviewed: ? ?Creatinine 2.46 ? ?Family Communication:  None at bedside ? ?Disposition: ?Status is: Inpatient ?Remains inpatient appropriate because: Waiting for cardiac and nephro evaluation ? ? Planned Discharge Destination: Home with Home Health ? ? ? DVT prophylaxis-Lovenox ?Time spent: 35 minutes ? ?Author: ?Max Sane, MD ?02/09/2022 1:34 PM ? ?For on call review  [email or web address]  ?

## 2022-02-09 NOTE — Care Management Important Message (Signed)
Important Message ? ?Patient Details  ?Name: Brian Pacheco ?MRN: [id number] ?Date of Birth: 02-09-1941 ? ? ?Medicare Important Message Given:  Yes ? ?Patient asleep upon time of visit.  Copy of Medicare IM left on patient beside tray for reference. ? ? ?Dannette Barbara ?02/09/2022, 11:21 AM ?

## 2022-02-10 ENCOUNTER — Inpatient Hospital Stay (HOSPITAL_COMMUNITY): Payer: Medicare Other

## 2022-02-10 DIAGNOSIS — I739 Peripheral vascular disease, unspecified: Secondary | ICD-10-CM

## 2022-02-10 DIAGNOSIS — I5023 Acute on chronic systolic (congestive) heart failure: Secondary | ICD-10-CM | POA: Diagnosis not present

## 2022-02-10 DIAGNOSIS — R0602 Shortness of breath: Secondary | ICD-10-CM

## 2022-02-10 DIAGNOSIS — R609 Edema, unspecified: Secondary | ICD-10-CM

## 2022-02-10 DIAGNOSIS — K7030 Alcoholic cirrhosis of liver without ascites: Secondary | ICD-10-CM | POA: Diagnosis not present

## 2022-02-10 DIAGNOSIS — Z944 Liver transplant status: Secondary | ICD-10-CM

## 2022-02-10 DIAGNOSIS — N1832 Chronic kidney disease, stage 3b: Secondary | ICD-10-CM

## 2022-02-10 DIAGNOSIS — J449 Chronic obstructive pulmonary disease, unspecified: Secondary | ICD-10-CM | POA: Diagnosis not present

## 2022-02-10 LAB — BASIC METABOLIC PANEL
Anion gap: 9 (ref 5–15)
BUN: 51 mg/dL — ABNORMAL HIGH (ref 8–23)
CO2: 23 mmol/L (ref 22–32)
Calcium: 9.1 mg/dL (ref 8.9–10.3)
Chloride: 103 mmol/L (ref 98–111)
Creatinine, Ser: 2.34 mg/dL — ABNORMAL HIGH (ref 0.61–1.24)
GFR, Estimated: 28 mL/min — ABNORMAL LOW (ref >60)
Glucose, Bld: 188 mg/dL — ABNORMAL HIGH (ref 70–99)
Potassium: 4.5 mmol/L (ref 3.5–5.1)
Sodium: 135 mmol/L (ref 135–145)

## 2022-02-10 LAB — CBC
HCT: 40.5 % (ref 39.0–52.0)
Hemoglobin: 13.4 g/dL (ref 13.0–17.0)
MCH: 30.5 pg (ref 26.0–34.0)
MCHC: 33.1 g/dL (ref 30.0–36.0)
MCV: 92 fL (ref 80.0–100.0)
Platelets: 215 10*3/uL (ref 150–400)
RBC: 4.4 MIL/uL (ref 4.22–5.81)
RDW: 13.1 % (ref 11.5–15.5)
WBC: 9.4 10*3/uL (ref 4.0–10.5)
nRBC: 0 % (ref 0.0–0.2)

## 2022-02-10 LAB — NM MYOCAR MULTI W/SPECT W/WALL MOTION / EF
Estimated workload: 1
Exercise duration (min): 0 min
Exercise duration (sec): 0 s
LV dias vol: 177 mL (ref 62–150)
LV sys vol: 114 mL
MPHR: 142 {beats}/min
Nuc Stress EF: 36 %
Peak HR: 85 {beats}/min
Percent HR: 59 %
Rest HR: 78 {beats}/min
Rest Nuclear Isotope Dose: 10.1 mCi
SDS: 7
SRS: 9
SSS: 15
ST Depression (mm): 0 mm
Stress Nuclear Isotope Dose: 28.9 mCi
TID: 0.95

## 2022-02-10 LAB — GLUCOSE, CAPILLARY
Glucose-Capillary: 181 mg/dL — ABNORMAL HIGH (ref 70–99)
Glucose-Capillary: 226 mg/dL — ABNORMAL HIGH (ref 70–99)
Glucose-Capillary: 273 mg/dL — ABNORMAL HIGH (ref 70–99)

## 2022-02-10 MED ORDER — TECHNETIUM TC 99M TETROFOSMIN IV KIT
28.9000 | PACK | Freq: Once | INTRAVENOUS | Status: AC | PRN
  Administered 2022-02-10: 28.9 via INTRAVENOUS

## 2022-02-10 MED ORDER — EZETIMIBE 10 MG PO TABS
10.0000 mg | ORAL_TABLET | Freq: Every day | ORAL | Status: DC
  Administered 2022-02-10 – 2022-02-11 (×2): 10 mg via ORAL
  Filled 2022-02-10 (×2): qty 1

## 2022-02-10 MED ORDER — REGADENOSON 0.4 MG/5ML IV SOLN
0.4000 mg | Freq: Once | INTRAVENOUS | Status: AC
Start: 2022-02-10 — End: 2022-02-10
  Administered 2022-02-10: 0.4 mg via INTRAVENOUS
  Filled 2022-02-10: qty 5

## 2022-02-10 MED ORDER — AMLODIPINE BESYLATE 5 MG PO TABS
2.5000 mg | ORAL_TABLET | Freq: Every day | ORAL | Status: DC
  Administered 2022-02-10 – 2022-02-11 (×2): 2.5 mg via ORAL
  Filled 2022-02-10 (×2): qty 1

## 2022-02-10 MED ORDER — TECHNETIUM TC 99M TETROFOSMIN IV KIT
10.0800 | PACK | Freq: Once | INTRAVENOUS | Status: AC | PRN
  Administered 2022-02-10: 10.08 via INTRAVENOUS

## 2022-02-10 NOTE — Progress Notes (Signed)
? ?  Brian Pacheco presented for a lexiscan cardiolite today.  No immediate complications.  Stress imaging is pending at this time. ? ?[name], NP ?02/10/2022, 1:06 PM   ?

## 2022-02-10 NOTE — Progress Notes (Signed)
Physical Therapy Treatment ?Patient Details ?Name: Brian Pacheco ?MRN: [id number] ?DOB: 1941-01-24 ?Today's Date: 02/10/2022 ? ? ?History of Present Illness Pt is a 81 yo male that presented to the ED for SOB with exertion. Admitted for acute CHF. PMH of asthma, DM, CKDIII, spinal stenosis, HTN, liver transplant, COPD. ? ?  ?PT Comments  ? ? Patient is agreeable to PT. He ambulated in the hallway without assistive device with one bout of veering to the right with turn that was self corrected. Patient remains independent or Mod I with all activity. Patient educated on energy conservation and taking rest breaks as needed. PT will continue to follow to maximize independence.  ?  ?Recommendations for follow up therapy are one component of a multi-disciplinary discharge planning process, led by the attending physician.  Recommendations may be updated based on patient status, additional functional criteria and insurance authorization. ? ?Follow Up Recommendations ? Outpatient PT ?  ?  ?Assistance Recommended at Discharge None  ?Patient can return home with the following   ?  ?Equipment Recommendations ? None recommended by PT  ?  ?Recommendations for Other Services   ? ? ?  ?Precautions / Restrictions Precautions ?Precautions: Fall ?Restrictions ?Weight Bearing Restrictions: No  ?  ? ?Mobility ? Bed Mobility ?  ?  ?  ?  ?  ?  ?  ?General bed mobility comments: not addressed as patient sitting up on arrival and post session ?  ? ?Transfers ?Overall transfer level: Modified independent ?  ?  ?  ?  ?  ?  ?  ?  ?General transfer comment: good safety awareness, no physical assistance required ?  ? ?Ambulation/Gait ?Ambulation/Gait assistance: Modified independent (Device/Increase time) ?Gait Distance (Feet): 170 Feet ?Assistive device: None ?Gait Pattern/deviations: Staggering right ?Gait velocity: decreased progressing to normal ?  ?  ?General Gait Details: one bout of veering to the right with turn that was self corrected,  occasional cues for safety. intermittently reaches out for wall railing for support. good balance overall. patient educated on energy conservation techniques, taking rest breaks as needed, walking program for conditioning. ? ? ?Stairs ?  ?  ?  ?  ?  ? ? ?Wheelchair Mobility ?  ? ?Modified Rankin (Stroke Patients Only) ?  ? ? ?  ?Balance   ?  ?  ?  ?  ?  ?  ?  ?  ?  ?  ?  ?  ?  ?  ?  ?  ?  ?  ?  ? ?  ?Cognition Arousal/Alertness: Awake/alert ?Behavior During Therapy: [facility] for tasks assessed/performed ?Overall Cognitive Status: Within Functional Limits for tasks assessed ?  ?  ?  ?  ?  ?  ?  ?  ?  ?  ?  ?  ?  ?  ?  ?  ?General Comments: patient able to follow all commands without difficulty ?  ?  ? ?  ?Exercises   ? ?  ?General Comments   ?  ?  ? ?Pertinent Vitals/Pain Pain Assessment ?Pain Assessment: No/denies pain  ? ? ?Home Living   ?  ?  ?  ?  ?  ?  ?  ?  ?  ?   ?  ?Prior Function    ?  ?  ?   ? ?PT Goals (current goals can now be found in the care plan section) Acute Rehab PT Goals ?Patient Stated Goal: to walk more, have less shortness of breath ?PT Goal  Formulation: With patient ?Time For Goal Achievement: 02/21/22 ?Potential to Achieve Goals: Good ?Progress towards PT goals: Progressing toward goals ? ?  ?Frequency ? ? ? Min 2X/week ? ? ? ?  ?PT Plan Current plan remains appropriate  ? ? ?Co-evaluation   ?  ?  ?  ?  ? ?  ?AM-PAC PT "6 Clicks" Mobility   ?Outcome Measure ? Help needed turning from your back to your side while in a flat bed without using bedrails?: None ?Help needed moving from lying on your back to sitting on the side of a flat bed without using bedrails?: None ?Help needed moving to and from a bed to a chair (including a wheelchair)?: None ?Help needed standing up from a chair using your arms (e.g., wheelchair or bedside chair)?: None ?Help needed to walk in hospital room?: None ?Help needed climbing 3-5 steps with a railing? : None ?6 Click Score: 24 ? ?  ?End of Session   ?Activity  Tolerance: Patient tolerated treatment well ?Patient left:  (sitting up on edge of bed) ?Nurse Communication: Mobility status ?PT Visit Diagnosis: Other abnormalities of gait and mobility (R26.89) ?  ? ? ?Time: 1025-8527 ?PT Time Calculation (min) (ACUTE ONLY): 15 min ? ?Charges:  $Gait Training: 8-22 mins          ?          ? ?Minna Merritts, PT, MPT ? ? ? ?Percell Locus ?02/10/2022, 12:57 PM ? ?

## 2022-02-10 NOTE — Progress Notes (Signed)
Mobility Specialist - Progress Note ? ? 02/10/22 1500  ?Mobility  ?Activity Ambulated independently in hallway;Stood at bedside;Dangled on edge of bed  ?Level of Assistance Standby assist, set-up cues, supervision of patient - no hands on  ?Assistive Device None  ?Distance Ambulated (ft) 160 ft  ?Activity Response Tolerated well  ?$Mobility charge 1 Mobility  ? ? ? ?Pre-mobility: 83 HR, 92% SpO2 ?During mobility:88 HR, 93% SpO2 ? ?Pt sitting EOB upon arrival using RA with family at bedside. Pt completes STS indep and ambulates 131f with supervision voicing fatigue from not eating and recent stress test this date. Pt returns EOB with needs in reach and family at bedside. ? ?MMerrily Brittle?Mobility Specialist ?02/10/22, 3:52 PM ? ? ? ? ?

## 2022-02-10 NOTE — Progress Notes (Signed)
?Progress Note ? ? ?Patient: Brian Pacheco ZSM:[id number] DOB: 05/15/1941 DOA: 02/05/2022     4 ?DOS: the patient was seen and examined on 02/10/2022 ?  ?Brief hospital course: ?Brian Pacheco is a 81 y.o. 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   ? ?In the ER, CTA chest ruled out PE, showed bilateral edema, small effusions.  Troponin low elevation consistent with CHF flare.  Started on lasix and admitted for CHF. ? ? ?4/9: Admitted on Lasix ?4/10: Symptoms resolved, Cardiology stopped Lasix, recommended ischemic work up ?4/11: Dyspnea and wheezing returned, Cardiology recommended resumption of diuresis; inhaled steroids increased ?4/12: Cardio planning stress test maybe tomorrow ?4/13: Myoview normal ? ? ?Assessment and Plan: ?* Acute on chronic HFrEF (heart failure with reduced ejection fraction) ([place]) ?Net IO Since Admission: -5,560 mL [02/10/22 1642] ? ?Cardiology re-reviewed echo, and feel like his EF may be close to 50% ?- Normal stress test ?- Continue beta-blocker  ? ? ? ?COPD, moderate ([place]) ?This was not initially supsected, but now suspect there may be some exacerbation here. ? 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  This may now be flaring, but not bad enough for steroids (given his brittle sugars) ?-Continue ICS/LABA/LAMA  ?- Continue Pulmicort and Claritin ?- Increase Trelegy dose at discharge ?- Pulm follow up at discharge  ? ?Myocardial injury ?Elevated troponin, probably due to poor renal excretion in setting of CHF. ?- normal Myoview ? ? ? ?Alcoholic cirrhosis of liver ([place]) ?S/p transplant.  Stable. ? ?Status post liver transplantation ([place]) ?-Continue MMF ? ?Immunosuppression ([place]) ?Due to liver transplant.  ?-Continue Cellcept ? ?Mood disorder ([place]) ? -Continue  venlafaxine ? ?Stage 3b chronic kidney disease (CKD) ([place]) ?Baseline Cr 1.8-2.1.   ?Lab Results  ?Component Value Date  ? CREATININE 2.34 (H) 02/10/2022  ? CREATININE 2.46 (H) 02/09/2022  ? CREATININE 2.36 (H) 02/08/2022  ? ?-d/w nephrology.  He is a established office patient.  They will follow him as an outpatient ?- Hold diuretics and nephrotoxins. ?- Resume Lasix at discharge tomorrow ? ? ?Controlled type 2 diabetes mellitus with diabetic nephropathy, with long-term current use of insulin ([place]) ?-Continue glargine and SS corrections ? ?Peripheral arterial disease ([place]) ?Stents in bialteral iliacs, and s/p CEA on left ?-Continue Plavix, fibrate ? ?Essential hypertension ?BP normal ?-Continue bisoprolol & amlodipine ?-Hold losartan given renal function ? ? ? ? ?  ? ?Subjective: Feeling better. hoping to get stress test done in order to go home tomorrow ? ?Physical Exam: ?Vitals:  ? 02/10/22 0539 02/10/22 0819 02/10/22 1433 02/10/22 1616  ?BP:  (!) 145/87 123/66 129/71  ?Pulse:  68 82 73  ?Resp:  '18 18 18  '$ ?Temp:  97.8 ?F (36.6 ?C)  97.9 ?F (36.6 ?C)  ?TempSrc:  Oral  Oral  ?SpO2:  94% 95% 94%  ?Weight: 100 kg     ?Height:      ? ?Obese adult male, lying in bed, sleeping, no obvious distress, mentation good, oropharynx moist, no oral lesions, hearing normal. ?RRR, no murmurs, no pitting lower extremity edema although he has mild nonpitting puffiness of the both legs, JVP not visible, abdomen large, soft, no obvious edema there. ?Lung sounds with scant wheezing with expiration, good air movement, no increased respiratory effort ?Attention normal, affect normal,  judgment and insight appear normal ? ?Data Reviewed: ? ?Creatinine 2.34 ? ?Family Communication: None ? ?Disposition: ?Status is: Inpatient ?Remains inpatient appropriate because: Had Myoview earlier, will recheck creatinine in the morning to decide Lasix for tomorrow and hopefully discharge ? ? Planned Discharge Destination: Home with Home Health ? ? ? DVT  prophylaxis-Lovenox ?Time spent: 35 minutes ? ?Author: ?Max Sane, MD ?02/10/2022 4:59 PM ? ?For on call review [email or web address]  ?

## 2022-02-10 NOTE — Progress Notes (Signed)
?   02/10/22 1445  ?Clinical Encounter Type  ?Visited With Patient and family together  ?Visit Type Initial;Spiritual support;Social support  ?Referral From Other (Comment)  ?Spiritual Encounters  ?Spiritual Needs Emotional;Other (Comment) ?(social support)  ? ?Chaplain Burris engaged Pt and spouse, Lovey Newcomer, initially to request spouse's assistance to serve as a witness. This became an opportunity for this chaplain to bear witness to the story of Mr. Norva Riffle health challenges and events since this past Saturday. Chaplain B offered compassionate, non-anxious presence and active listening. Chaplain B offered normalization of emotions for what Pt and spouse expressed they have gone through. This seemed to be a good opportunity for processing feelings about what has happened, to reflect on the meaning of it and also for celebration. ? ? ?

## 2022-02-10 NOTE — Assessment & Plan Note (Signed)
Net IO Since Admission: -5,560 mL [02/10/22 1642] ? ?Cardiology re-reviewed echo, and feel like his EF may be close to 50% ?- Normal stress test ?- Continue beta-blocker  ? ? ?

## 2022-02-10 NOTE — Assessment & Plan Note (Signed)
-  Continue glargine and SS corrections ?

## 2022-02-10 NOTE — Assessment & Plan Note (Signed)
Elevated troponin, probably due to poor renal excretion in setting of CHF. ?- normal Myoview ? ? ?

## 2022-02-10 NOTE — Progress Notes (Addendum)
? ?Cardiology Progress Note  ? ?Patient Name: Brian Pacheco ?Date of Encounter: 02/10/2022 ? ?Primary Cardiologist: Nelva Bush, MD ? ?Subjective  ? ?No chest pain.  Slept well last night - able to lie flat.  This AM, he notes that when lying flat, he sometimes feels like he can't get a satisfying breath.  NPO for myoview this AM. ? ?Inpatient Medications  ?  ?Scheduled Meds: ? bisoprolol  5 mg Oral Daily  ? budesonide (PULMICORT) nebulizer solution  0.25 mg Nebulization BID  ? clopidogrel  75 mg Oral Daily  ? enoxaparin (LOVENOX) injection  40 mg Subcutaneous Q24H  ? fenofibrate  160 mg Oral Daily  ? fluticasone furoate-vilanterol  1 puff Inhalation Daily  ? And  ? umeclidinium bromide  1 puff Inhalation Daily  ? insulin aspart  0-20 Units Subcutaneous TID WC  ? insulin glargine-yfgn  25 Units Subcutaneous Daily  ? ipratropium-albuterol  3 mL Nebulization TID  ? loratadine  10 mg Oral Daily  ? mycophenolate  250 mg Oral BID  ? pantoprazole  40 mg Oral Daily  ? rosuvastatin  5 mg Oral Daily  ? tamsulosin  0.4 mg Oral Daily  ? venlafaxine  75 mg Oral TID  ? ?Continuous Infusions: ? ?PRN Meds: ?albuterol, hydrocortisone cream, melatonin, ondansetron (ZOFRAN) IV, phenol, polyethylene glycol  ? ?Vital Signs  ?  ?Vitals:  ? 02/09/22 2336 02/10/22 0423 02/10/22 0539 02/10/22 0819  ?BP: 136/87 (!) 144/76  (!) 145/87  ?Pulse: (!) 103 73  68  ?Resp: '17 16  18  '$ ?Temp: 97.6 ?F (36.4 ?C) 98.1 ?F (36.7 ?C)  97.8 ?F (36.6 ?C)  ?TempSrc:  Oral  Oral  ?SpO2: 95% 95%  94%  ?Weight:   100 kg   ?Height:      ? ? ?Intake/Output Summary (Last 24 hours) at 02/10/2022 0926 ?Last data filed at 02/09/2022 1700 ?Gross per 24 hour  ?Intake 360 ml  ?Output 1000 ml  ?Net -640 ml  ? ?Filed Weights  ? 02/08/22 0450 02/09/22 0316 02/10/22 0539  ?Weight: 103.2 kg 99.8 kg 100 kg  ? ? ?Physical Exam  ? ?GEN: Well nourished, well developed, in no acute distress.  ?HEENT: Grossly normal.  ?Neck: Supple, no JVD, carotid bruits, or masses. ?Cardiac:  RRR, no murmurs, rubs, or gallops. No clubbing, cyanosis, edema.  Radials 2+, DP/PT 1+ and equal bilaterally.  ?Respiratory:  Respirations regular and unlabored, clear to auscultation bilaterally. ?GI: Obese, soft, nontender, nondistended, BS + x 4. ?MS: no deformity or atrophy. ?Skin: warm and dry, no rash. ?Neuro:  Strength and sensation are intact. ?Psych: AAOx3.  Normal affect. ? ?Labs  ?  ?Chemistry ?Recent Labs  ?Lab 02/05/22 ?2150 02/06/22 ?0424 02/07/22 ?4765 02/08/22 ?4650 02/09/22 ?3546 02/10/22 ?0636  ?NA 141 136   < > 133* 136 135  ?K 3.9 4.6   < > 4.9 4.5 4.5  ?CL 103 99   < > 98 99 103  ?CO2 29 28   < > '25 29 23  '$ ?GLUCOSE 210* 323*   < > 203* 145* 188*  ?BUN 26* 27*   < > 47* 50* 51*  ?CREATININE 1.63* 1.85*   < > 2.36* 2.46* 2.34*  ?CALCIUM 8.4* 8.8*   < > 9.4 9.3 9.1  ?PROT 6.7 7.2  --   --   --   --   ?ALBUMIN 3.3* 3.5  --   --   --   --   ?AST 24 39  --   --   --   --   ?  ALT 18 19  --   --   --   --   ?ALKPHOS 63 55  --   --   --   --   ?BILITOT 0.5 0.6  --   --   --   --   ?GFRNONAA 43* 37*   < > 27* 26* 28*  ?ANIONGAP 9 9   < > '10 8 9  '$ ? < > = values in this interval not displayed.  ?  ? ?Hematology ?Recent Labs  ?Lab 02/06/22 ?0424 02/09/22 ?3300 02/10/22 ?0636  ?WBC 7.9 8.7 9.4  ?RBC 4.21* 4.27 4.40  ?HGB 12.7* 13.1 13.4  ?HCT 39.5 39.7 40.5  ?MCV 93.8 93.0 92.0  ?MCH 30.2 30.7 30.5  ?MCHC 32.2 33.0 33.1  ?RDW 13.1 13.1 13.1  ?PLT 184 203 215  ? ? ?Cardiac Enzymes  ?Recent Labs  ?Lab 02/05/22 ?2150 02/05/22 ?2351 02/06/22 ?0424 02/06/22 ?7622  ?TROPONINIHS 88* 109* 137* 143*  ?   ? ?BNP ?   ?Component Value Date/Time  ? BNP 236.9 (H) 02/05/2022 2150  ? ? ?ProBNP ?No results found for: PROBNP ? ? ?DDimer  ?Recent Labs  ?Lab 02/05/22 ?2150  ?DDIMER 0.82*  ?  ? ?Lipids  ?Lab Results  ?Component Value Date  ? CHOL 219 (H) 02/06/2022  ? HDL 40 (L) 02/06/2022  ? LDLCALC 152 (H) 02/06/2022  ? TRIG 134 02/06/2022  ? CHOLHDL 5.5 02/06/2022  ? ? ?HbA1c  ?Lab Results  ?Component Value Date  ? HGBA1C 7.8  (H) 02/06/2022  ? ? ?Radiology  ?  ?----------------- ? ?Telemetry  ?  ?RSR, 70's to 90's - Personally Reviewed ? ?Cardiac Studies  ? ?2D Echocardiogram 04.09.2023 ? ?1. Left ventricular ejection fraction, by estimation, is 40 to 45%. The  ?left ventricle has mildly decreased function. The left ventricle  ?demonstrates global hypokinesis. There is mild concentric left ventricular  ?hypertrophy. Left ventricular diastolic  ?parameters are consistent with Grade III diastolic dysfunction  ?(restrictive).  ? 2. Right ventricular systolic function is normal. The right ventricular  ?size is mildly enlarged.  ? 3. Left atrial size was mildly dilated.  ? 4. Right atrial size was mildly dilated.  ? 5. The mitral valve is normal in structure. Mild mitral valve  ?regurgitation. No evidence of mitral stenosis.  ? 6. Mild tricuspid stenosis.  ? 7. The aortic valve is normal in structure. Aortic valve regurgitation is  ?not visualized. Aortic valve sclerosis is present, with no evidence of  ?aortic valve stenosis.  ? 8. The inferior vena cava is normal in size with greater than 50%  ?respiratory variability, suggesting right atrial pressure of 3 mmHg.  ?_____________  ? ?Patient Profile  ?   ?81 y.o. 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 ? ?Assessment & Plan  ?  ?1.  Acute HFrEF: Admitted w/ dyspnea and volume overload.  Echo w/ EF of 40-45%, though Dr. Saunders Revel reviewed imaging and felt EF closer to 50-55%.  Responded well to IV lasix - last dose 4/11.  Minus 5.5 L for admission.  Wt relatively stable @ 100 kg.  Renal fxn stable @ 51/2.34.  Able to sleep lying flat last night.  Notes that occasionally feels like he needs to take a deeper, more satisfying breath when lying down this AM.  Euvolemic on exam.  Plan for MV this AM to r/o ischemia.  Cont ? blocker.  No acei/arb/arni/mra/sglt2i  in setting of CKD. ? ?2.  Coronary Ca2+/Demand Ischemia:  Peak HsTrop of 143 -  flat trend.  No c/p.  For MV this AM (no cath in setting of CKD).  Cont ? blocker, plavix, statin. ? ?3.  Essential HTN:  BP trending 130's to 140's.   Was on losartan 100 daily as outpt - held in setting of acute on CKD.  Will add low-dose amlodipine. ? ?4.  HL:  LDL 152 on low-dose rosuvastatin.  Has not tolerated more aggressive statin rx in the past.  Will add zetia. ? ?5.  CKD III:  Creat bumped to peak of 2.46 on 4/12 in setting of IV lasix and contrast/CTA on 4/8.  Creat improved this AM @ 2.34.  Avoiding nephrotoxic agents. ? ?Signed, ?[name], NP  ?02/10/2022, 9:26 AM   ? ?For questions or updates, please contact   ?Please consult [email or web address] for contact info under Cardiology/STEMI.  ?

## 2022-02-10 NOTE — Assessment & Plan Note (Signed)
BP normal ?-Continue bisoprolol & amlodipine ?-Hold losartan given renal function ?

## 2022-02-10 NOTE — Assessment & Plan Note (Signed)
Baseline Cr 1.8-2.1.   ?Lab Results  ?Component Value Date  ? CREATININE 2.34 (H) 02/10/2022  ? CREATININE 2.46 (H) 02/09/2022  ? CREATININE 2.36 (H) 02/08/2022  ? ?-d/w nephrology.  He is a established office patient.  They will follow him as an outpatient ?- Hold diuretics and nephrotoxins. ?- Resume Lasix at discharge tomorrow ? ?

## 2022-02-10 NOTE — Assessment & Plan Note (Signed)
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

## 2022-02-10 NOTE — Assessment & Plan Note (Signed)
This was not initially supsected, but now suspect there may be some exacerbation here. ? 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  This may now be flaring, but not bad enough for steroids (given his brittle sugars) ?-Continue ICS/LABA/LAMA  ?- Continue Pulmicort and Claritin ?- Increase Trelegy dose at discharge ?- Pulm follow up at discharge  ?

## 2022-02-11 DIAGNOSIS — R739 Hyperglycemia, unspecified: Secondary | ICD-10-CM

## 2022-02-11 DIAGNOSIS — R7989 Other specified abnormal findings of blood chemistry: Secondary | ICD-10-CM

## 2022-02-11 DIAGNOSIS — I130 Hypertensive heart and chronic kidney disease with heart failure and stage 1 through stage 4 chronic kidney disease, or unspecified chronic kidney disease: Principal | ICD-10-CM

## 2022-02-11 DIAGNOSIS — F39 Unspecified mood [affective] disorder: Secondary | ICD-10-CM

## 2022-02-11 DIAGNOSIS — E1151 Type 2 diabetes mellitus with diabetic peripheral angiopathy without gangrene: Secondary | ICD-10-CM

## 2022-02-11 DIAGNOSIS — E1122 Type 2 diabetes mellitus with diabetic chronic kidney disease: Secondary | ICD-10-CM

## 2022-02-11 DIAGNOSIS — I5A Non-ischemic myocardial injury (non-traumatic): Secondary | ICD-10-CM

## 2022-02-11 DIAGNOSIS — J441 Chronic obstructive pulmonary disease with (acute) exacerbation: Secondary | ICD-10-CM

## 2022-02-11 LAB — CBC
HCT: 42.2 % (ref 39.0–52.0)
Hemoglobin: 13.8 g/dL (ref 13.0–17.0)
MCH: 30.5 pg (ref 26.0–34.0)
MCHC: 32.7 g/dL (ref 30.0–36.0)
MCV: 93.2 fL (ref 80.0–100.0)
Platelets: 231 10*3/uL (ref 150–400)
RBC: 4.53 MIL/uL (ref 4.22–5.81)
RDW: 12.8 % (ref 11.5–15.5)
WBC: 9.7 10*3/uL (ref 4.0–10.5)
nRBC: 0 % (ref 0.0–0.2)

## 2022-02-11 LAB — BASIC METABOLIC PANEL
Anion gap: 9 (ref 5–15)
BUN: 49 mg/dL — ABNORMAL HIGH (ref 8–23)
CO2: 23 mmol/L (ref 22–32)
Calcium: 8.9 mg/dL (ref 8.9–10.3)
Chloride: 103 mmol/L (ref 98–111)
Creatinine, Ser: 2.37 mg/dL — ABNORMAL HIGH (ref 0.61–1.24)
GFR, Estimated: 27 mL/min — ABNORMAL LOW (ref >60)
Glucose, Bld: 200 mg/dL — ABNORMAL HIGH (ref 70–99)
Potassium: 4.4 mmol/L (ref 3.5–5.1)
Sodium: 135 mmol/L (ref 135–145)

## 2022-02-11 LAB — GLUCOSE, CAPILLARY: Glucose-Capillary: 202 mg/dL — ABNORMAL HIGH (ref 70–99)

## 2022-02-11 MED ORDER — AMLODIPINE BESYLATE 5 MG PO TABS: 5.0000 mg | ORAL_TABLET | Freq: Every day | ORAL | 0 refills | Status: AC

## 2022-02-11 MED ORDER — BISOPROLOL FUMARATE 5 MG PO TABS: 5.0000 mg | ORAL_TABLET | Freq: Every day | ORAL | 0 refills | Status: AC

## 2022-02-11 MED ORDER — EZETIMIBE 10 MG PO TABS: 10.0000 mg | ORAL_TABLET | Freq: Every day | ORAL | 0 refills | Status: AC

## 2022-02-11 MED ORDER — FUROSEMIDE 20 MG PO TABS: 20.0000 mg | ORAL_TABLET | Freq: Every day | ORAL | 0 refills | Status: AC

## 2022-02-11 NOTE — Progress Notes (Signed)
? ?Cardiology Progress Note  ? ?Patient Name: Brian Pacheco ?Date of Encounter: 02/11/2022 ? ?Primary Cardiologist: Nelva Bush, MD ? ?Subjective  ? ?Feels well this morning.  No chest pain or dyspnea.  Eager to go home.. ? ?Inpatient Medications  ?  ?Scheduled Meds: ? amLODipine  2.5 mg Oral Daily  ? bisoprolol  5 mg Oral Daily  ? budesonide (PULMICORT) nebulizer solution  0.25 mg Nebulization BID  ? clopidogrel  75 mg Oral Daily  ? enoxaparin (LOVENOX) injection  40 mg Subcutaneous Q24H  ? ezetimibe  10 mg Oral Daily  ? fenofibrate  160 mg Oral Daily  ? fluticasone furoate-vilanterol  1 puff Inhalation Daily  ? And  ? umeclidinium bromide  1 puff Inhalation Daily  ? insulin aspart  0-20 Units Subcutaneous TID WC  ? insulin glargine-yfgn  25 Units Subcutaneous Daily  ? ipratropium-albuterol  3 mL Nebulization TID  ? loratadine  10 mg Oral Daily  ? mycophenolate  250 mg Oral BID  ? pantoprazole  40 mg Oral Daily  ? rosuvastatin  5 mg Oral Daily  ? tamsulosin  0.4 mg Oral Daily  ? venlafaxine  75 mg Oral TID  ? ?Continuous Infusions: ? ?PRN Meds: ?albuterol, hydrocortisone cream, melatonin, ondansetron (ZOFRAN) IV, phenol, polyethylene glycol  ? ?Vital Signs  ?  ?Vitals:  ? 02/11/22 0009 02/11/22 0416 02/11/22 0500 02/11/22 0806  ?BP: 135/73 (!) 108/56  (!) 144/76  ?Pulse: 74 69  99  ?Resp: '20 20  18  '$ ?Temp: 97.8 ?F (36.6 ?C) 98.3 ?F (36.8 ?C)  98.2 ?F (36.8 ?C)  ?TempSrc: Oral Oral    ?SpO2: 100% 94%  92%  ?Weight:   98.1 kg   ?Height:      ? ? ?Intake/Output Summary (Last 24 hours) at 02/11/2022 1012 ?Last data filed at 02/10/2022 1842 ?Gross per 24 hour  ?Intake 240 ml  ?Output 350 ml  ?Net -110 ml  ? ?Filed Weights  ? 02/09/22 0316 02/10/22 0539 02/11/22 0500  ?Weight: 99.8 kg 100 kg 98.1 kg  ? ? ?Physical Exam  ? ?GEN: Well nourished, well developed, in no acute distress.  ?HEENT: Grossly normal.  ?Neck: Supple, no JVD, carotid bruits, or masses. ?Cardiac: RRR, no murmurs, rubs, or gallops. No clubbing,  cyanosis, edema.  Radials 2+, DP/PT 2+ and equal bilaterally.  ?Respiratory:  Respirations regular and unlabored, clear to auscultation bilaterally. ?GI: Obese, soft, nontender, nondistended, BS + x 4. ?MS: no deformity or atrophy. ?Skin: warm and dry, no rash. ?Neuro:  Strength and sensation are intact. ?Psych: AAOx3.  Normal affect. ? ?Labs  ?  ?Chemistry ?Recent Labs  ?Lab 02/05/22 ?2150 02/06/22 ?0424 02/07/22 ?7939 02/09/22 ?0300 02/10/22 ?9233 02/11/22 ?0076  ?NA 141 136   < > 136 135 135  ?K 3.9 4.6   < > 4.5 4.5 4.4  ?CL 103 99   < > 99 103 103  ?CO2 29 28   < > '29 23 23  '$ ?GLUCOSE 210* 323*   < > 145* 188* 200*  ?BUN 26* 27*   < > 50* 51* 49*  ?CREATININE 1.63* 1.85*   < > 2.46* 2.34* 2.37*  ?CALCIUM 8.4* 8.8*   < > 9.3 9.1 8.9  ?PROT 6.7 7.2  --   --   --   --   ?ALBUMIN 3.3* 3.5  --   --   --   --   ?AST 24 39  --   --   --   --   ?  ALT 18 19  --   --   --   --   ?ALKPHOS 63 55  --   --   --   --   ?BILITOT 0.5 0.6  --   --   --   --   ?GFRNONAA 43* 37*   < > 26* 28* 27*  ?ANIONGAP 9 9   < > '8 9 9  '$ ? < > = values in this interval not displayed.  ?  ? ?Hematology ?Recent Labs  ?Lab 02/09/22 ?1025 02/10/22 ?8527 02/11/22 ?7824  ?WBC 8.7 9.4 9.7  ?RBC 4.27 4.40 4.53  ?HGB 13.1 13.4 13.8  ?HCT 39.7 40.5 42.2  ?MCV 93.0 92.0 93.2  ?MCH 30.7 30.5 30.5  ?MCHC 33.0 33.1 32.7  ?RDW 13.1 13.1 12.8  ?PLT 203 215 231  ? ? ?Cardiac Enzymes  ?Recent Labs  ?Lab 02/05/22 ?2150 02/05/22 ?2351 02/06/22 ?0424 02/06/22 ?2353  ?TROPONINIHS 88* 109* 137* 143*  ?   ? ?BNP ?   ?Component Value Date/Time  ? BNP 236.9 (H) 02/05/2022 2150  ? ? ?ProBNP ?No results found for: PROBNP ? ? ?DDimer  ?Recent Labs  ?Lab 02/05/22 ?2150  ?DDIMER 0.82*  ?  ? ?Lipids  ?Lab Results  ?Component Value Date  ? CHOL 219 (H) 02/06/2022  ? HDL 40 (L) 02/06/2022  ? LDLCALC 152 (H) 02/06/2022  ? TRIG 134 02/06/2022  ? CHOLHDL 5.5 02/06/2022  ? ? ?HbA1c  ?Lab Results  ?Component Value Date  ? HGBA1C 7.8 (H) 02/06/2022  ? ? ?Radiology  ?   ?---------------- ? ?Telemetry  ?  ?Regular sinus rhythm- Personally Reviewed ? ?Cardiac Studies  ? ?2D Echocardiogram 04.09.2023 ?  ?1. Left ventricular ejection fraction, by estimation, is 40 to 45%. The  ?left ventricle has mildly decreased function. The left ventricle  ?demonstrates global hypokinesis. There is mild concentric left ventricular  ?hypertrophy. Left ventricular diastolic  ?parameters are consistent with Grade III diastolic dysfunction  ?(restrictive).  ? 2. Right ventricular systolic function is normal. The right ventricular  ?size is mildly enlarged.  ? 3. Left atrial size was mildly dilated.  ? 4. Right atrial size was mildly dilated.  ? 5. The mitral valve is normal in structure. Mild mitral valve  ?regurgitation. No evidence of mitral stenosis.  ? 6. Mild tricuspid stenosis.  ? 7. The aortic valve is normal in structure. Aortic valve regurgitation is  ?not visualized. Aortic valve sclerosis is present, with no evidence of  ?aortic valve stenosis.  ? 8. The inferior vena cava is normal in size with greater than 50%  ?respiratory variability, suggesting right atrial pressure of 3 mmHg.  ?_____________  ? ?Lexiscan Cardiolite 04.13.2023 ? ?Pharmacological myocardial perfusion imaging study with no significant  ischemia ?Fixed defects in the distal anteroseptal, apical region, ?Fixed inferior wall defect, unable to exclude diaphragmatic attenuation artifact ?Normal wall motion, EF estimated at 29% (likely depressed secondary to GI uptake artifact) ?No EKG changes concerning for ischemia at peak stress or in recovery. ?Attenuation correction images not available ?Moderate risk scan in the setting of depressed ejection fraction ?_____________  ? ?Patient Profile  ?   ?81 y.o. 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 ? ?Assessment & Plan  ?  ?1.  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  Echo with EF of 40 to 45%, though Dr. Saunders Revel reviewed  imaging and felt EF closer to 50 to 55%.  Weight and renal function have been stable.  Agree with addition of Lasix 20 mg daily at discharge with early outpatient heart failure clinic follow-up.  Continue beta-blocker.  No acei/arb/arni/mra/sglt2i in setting of CKD. ? ?2.  Coronary calcium/demand ischemia: Peak high-sensitivity troponin of 143 with a flat trend.  No chest pain.  Nonischemic Myoview yesterday.  Continue beta-blocker, Plavix, and statin therapy.  No further ischemic work-up warranted. ? ?3.  Essential hypertension: Blood pressure is relatively stable following addition of amlodipine.  Was on losartan 100 mg daily as outpatient, though this was held in the setting of acute kidney injury. ? ?4.  Hyperlipidemia: LDL 152 on low-dose rosuvastatin.  He has not tolerated more aggressive statin therapy in the past.  Zetia added this admission. ? ?5.  Stage III chronic kidney disease: Creatinine stable at 2.37.  Will need follow-up basic metabolic panel at heart failure clinic follow-up next week in the setting of resumption of low-dose Lasix. ? ?Signed, ?[name], NP  ?02/11/2022, 10:12 AM   ? ?For questions or updates, please contact   ?Please consult [email or web address] for contact info under Cardiology/STEMI.  ?

## 2022-02-11 NOTE — TOC Transition Note (Signed)
Transition of Care (TOC) - CM/SW Discharge Note ? ? ?Patient Details  ?Name: Brian Pacheco ?MRN: [id number] ?Date of Birth: 18-Nov-1940 ? ?Transition of Care (TOC) CM/SW Contact:  ?Alberteen Sam, LCSW ?Phone Number: ?02/11/2022, 10:55 AM ? ? ?Clinical Narrative:    ? ?Patient will dc today home with [facility] and Rn with [facility], Corene Cornea with [facility] informed of patient's dc today. MD informed of need for [facility] orders.  ? ?No further dc needs.  ? ? ?Final next level of care: [facility] ?Barriers to Discharge: No Barriers Identified ? ? ?Patient Goals and CMS Choice ?Patient states their goals for this hospitalization and ongoing recovery are:: to go home ?CMS Medicare.gov Compare Post Acute Care list provided to:: Patient ?Choice offered to / list presented to : Patient ? ?Discharge Placement ?  ?           ?  ?  ?  ?Patient and family notified of of transfer: 02/11/22 ? ?Discharge Plan and Services ?  ?  ?           ?  ?  ?  ?  ?  ?HH Arranged: PT, RN ?[place] Agency: [facility] ([facility]) ?Date HH Agency Contacted: 02/11/22 ?Time [place]: 0017 ?Representative spoke with at [place] [name]: Corene Cornea ? ?Social Determinants of Health (SDOH) Interventions ?  ? ? ?Readmission Risk Interventions ?   ? View : No data to display.  ?  ?  ?  ? ? ? ? ? ?

## 2022-02-13 NOTE — Discharge Summary (Signed)
?Physician Discharge Summary ?  ?Patient: Brian Pacheco MRN: [id number] DOB: 03-Jul-1941  ?Admit date:     02/05/2022  ?Discharge date: 02/11/2022  ?Discharge Physician: Brian Pacheco  ? ?PCP: Brian Late, MD  ? ?Recommendations at discharge:  ? ? F/up with outpt providers as requested ? ?Discharge Diagnoses: ?Principal Problem: ?  Acute on chronic HFrEF (heart failure with reduced ejection fraction) ([place]) ?Active Problems: ?  COPD, moderate ([place]) ?  Essential hypertension ?  Peripheral arterial disease ([place]) ?  Controlled type 2 diabetes mellitus with diabetic nephropathy, with long-term current use of insulin ([place]) ?  Stage 3b chronic kidney disease (CKD) ([place]) ?  Mood disorder ([place]) ?  Immunosuppression ([place]) ?  Status post liver transplantation ([place]) ?  Alcoholic cirrhosis of liver ([place]) ?  Myocardial injury ?  Coronary artery disease involving native coronary artery of native heart without angina pectoris ?  COPD exacerbation ([place]) ?  Elevated brain natriuretic peptide (BNP) level ?  Hyperglycemia ? ?Hospital Course: ?Brian Pacheco is a 81 y.o. 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   ? ?In the ER, CTA chest ruled out PE, showed bilateral edema, small effusions.  Troponin low elevation consistent with CHF flare.  Started on lasix and admitted for CHF. ? ? ?4/9: Admitted on Lasix ?4/10: Symptoms resolved, Cardiology stopped Lasix, recommended ischemic work up ?4/11: Dyspnea and wheezing returned, Cardiology recommended resumption of diuresis; inhaled steroids increased ?4/12: Cardio planning stress test maybe tomorrow ?4/13: Myoview normal ? ?Assessment and Plan: ?* Acute on chronic HFrEF (heart failure with reduced ejection fraction) ([place]) ?Net IO Since Admission: -5,560 mL [02/10/22 1642] ?Echo shows EF 40-50% ?- Normal stress test ?- Continue beta-blocker  ?- Not a candidate for ACE, ARB, Entresto in the  setting of chronic kidney disease ? ?COPD, moderate ([place]) ?Stable ? ?Myocardial injury ?Elevated troponin, probably due to poor renal excretion in setting of CHF. ?- normal Myoview ? ?Alcoholic cirrhosis of liver ([place]) ?S/p transplant.  Stable. ? ?Status post liver transplantation ([place]) ?-Continue MMF ? ?Immunosuppression ([place]) ?Due to liver transplant.  ?-Continue Cellcept ? ?Mood disorder ([place]) ? -Continue venlafaxine ? ?Stage 3b chronic kidney disease (CKD) ([place]) ?Baseline Cr 1.8-2.1.   ?Lab Results  ?Component Value Date  ? CREATININE 2.34 (H) 02/10/2022  ? CREATININE 2.46 (H) 02/09/2022  ? CREATININE 2.36 (H) 02/08/2022  ? ?-resume low dose lasix at D/C with outpt Nephro f/up ? ?Controlled type 2 diabetes mellitus with diabetic nephropathy, with long-term current use of insulin ([place]) ?-resume home regimen ? ?Peripheral arterial disease ([place]) ?Stents in bialteral iliacs, and s/p CEA on left ?-Continue Plavix, fibrate ? ?Essential hypertension ?BP normal ?-Continue bisoprolol & amlodipine ?-Hold losartan given renal function ? ? ? ? ?  ? ? ?Consultants: Cardio ?Procedures performed: Myoview with no significant ischemia ?Disposition: Home health ?Diet recommendation:  ?Discharge Diet Orders (From admission, onward)  ? ?  Start     Ordered  ? 02/11/22 0000  Diet - low sodium heart healthy       ? 02/11/22 0951  ? ?  ?  ? ?  ? ?Carb modified diet ?DISCHARGE MEDICATION: ?Allergies as of 02/11/2022   ? ?   Reactions  ? Hydralazine Rash, Shortness Of Breath  ? Fenofibrate Other (See Comments)  ? Pravastatin   ? Other reaction(s): Other (See Comments)  ? Benazepril   ? Other reaction(s):  Other (See Comments)  ? Sitagliptin Other (See Comments), Rash  ? blisters ?Other reaction(s): Other (see comments) ?Other reaction(s): Other (See Comments) ?blisters ?blisters ?blisters ?Other reaction(s): Other (See Comments) ?blisters ?blisters  ? ?  ? ?  ?Medication List  ?  ? ?STOP taking these medications   ? ?aspirin EC 81 MG  tablet ?  ?budesonide-formoterol 80-4.5 MCG/ACT inhaler ?Commonly known as: SYMBICORT ?  ?cephALEXin 500 MG capsule ?Commonly known as: KEFLEX ?  ?Cholecalciferol 25 MCG (1000 UT) tablet ?  ?FIRST-MOUTHWASH BLM MT ?  ?guaiFENesin 600 MG 12 hr tablet ?Commonly known as: [place] ?  ?insulin aspart 100 UNIT/ML FlexPen ?Commonly known as: NOVOLOG ?  ?insulin glargine 100 UNIT/ML injection ?Commonly known as: LANTUS ?  ?losartan 100 MG tablet ?Commonly known as: COZAAR ?  ?predniSONE 10 MG tablet ?Commonly known as: DELTASONE ?  ?tiZANidine 4 MG tablet ?Commonly known as: ZANAFLEX ?  ?traMADol 50 MG tablet ?Commonly known as: ULTRAM ?  ? ?  ? ?TAKE these medications   ? ?albuterol 108 (90 Base) MCG/ACT inhaler ?Commonly known as: VENTOLIN HFA ?Inhale 2 puffs into the lungs every 4 (four) hours as needed. ?  ?amLODipine 5 MG tablet ?Commonly known as: NORVASC ?Take 1 tablet (5 mg total) by mouth daily. ?What changed:  ?medication strength ?how much to take ?  ?bisoprolol 5 MG tablet ?Commonly known as: ZEBETA ?Take 1 tablet (5 mg total) by mouth daily. ?  ?clopidogrel 75 MG tablet ?Commonly known as: PLAVIX ?Take 75 mg by mouth daily. ?  ?Dupilumab 300 MG/2ML Sopn ?Inject 300 mg into the skin every 14 (fourteen) days. ?  ?ezetimibe 10 MG tablet ?Commonly known as: ZETIA ?Take 1 tablet (10 mg total) by mouth daily. ?  ?fenofibrate 160 MG tablet ?Take 160 mg by mouth daily. ?  ?furosemide 20 MG tablet ?Commonly known as: Lasix ?Take 1 tablet (20 mg total) by mouth daily. ?  ?glipiZIDE 5 MG tablet ?Commonly known as: GLUCOTROL ?Take 5 mg by mouth 2 (two) times daily before a meal. ?  ?metFORMIN 500 MG tablet ?Commonly known as: GLUCOPHAGE ?Take 500 mg by mouth 2 (two) times daily with a meal. ?  ?mycophenolate 250 MG capsule ?Commonly known as: CELLCEPT ?Take 250 mg by mouth 2 (two) times daily. ?  ?omeprazole 20 MG capsule ?Commonly known as: PRILOSEC ?Take 20 mg by mouth daily. ?  ?rosuvastatin 5 MG tablet ?Commonly known  as: CRESTOR ?Take 2.5 mg by mouth at bedtime. ?  ?sildenafil 100 MG tablet ?Commonly known as: VIAGRA ?Take 100 mg by mouth daily. ?  ?tamsulosin 0.4 MG Caps capsule ?Commonly known as: FLOMAX ?Take 0.4 mg by mouth daily. ?  ?Trelegy Ellipta 100-62.5-25 MCG/ACT Aepb ?Generic drug: Fluticasone-Umeclidin-Vilant ?Inhale 1 puff into the lungs daily. ?  ?venlafaxine 75 MG tablet ?Commonly known as: EFFEXOR ?Take 75 mg by mouth 3 (three) times daily. ?  ? ?  ? ? Follow-up Information   ? ? Brian Late, MD. Schedule an appointment as soon as possible for a visit in 3 day(s).   ?Specialty: Family Medicine ?Why: [facility] Discharge F/UP ?Contact information: ?[address] ?[facility] ?[place] [address] ?[phone] ? ? ?  ?  ? ? End, Harrell Gave, MD. Schedule an appointment as soon as possible for a visit in 2 week(s).   ?Specialty: Cardiology ?Why: [facility] Discharge F/UP ?Contact information: ?[address] ?[place] [address] ?[phone] ? ? ?  ?  ? ?  Holley Raring Munsoor, MD. Schedule an appointment as soon as possible for a visit in 1 week(s).   ?Specialty: Nephrology ?Why: [facility] Discharge F/UP ?Contact information: ?[address] ?[address] ?[place] [address] ?[phone] ? ? ?  ?  ? ?  ?  ? ?  ? ?Discharge Exam: ?Filed Weights  ? 02/09/22 0316 02/10/22 0539 02/11/22 0500  ?Weight: 99.8 kg 100 kg 98.1 kg  ? ?Obese adult male, lying in bed, sleeping, no obvious distress, mentation good, oropharynx moist, no oral lesions, hearing normal. ?RRR, no murmurs, no pitting lower extremity edema although he has mild nonpitting puffiness of the both legs, JVP not visible, abdomen large, soft, no obvious edema there. ?Lung sounds with scant wheezing with expiration, good air movement, no increased respiratory effort ?Attention normal, affect normal, judgment and insight appear normal ? ?Condition at discharge: fair ? ?The results of significant  diagnostics from this hospitalization (including imaging, microbiology, ancillary and laboratory) are listed below for reference.  ? ?Imaging Studies: ?DG Chest 2 View ? ?Result Date: 02/05/2022 ?CLINICAL DATA

## 2022-02-14 ENCOUNTER — Telehealth: Payer: Self-pay

## 2022-02-14 NOTE — Telephone Encounter (Signed)
Attempted to contact patient/patient's spouse Katharine Look to schedule a Palliative Care consult appointment. No answer and unable to leave a message. Voicemail has not been setup yet.  ? ?

## 2022-02-16 ENCOUNTER — Telehealth: Payer: Self-pay

## 2022-02-16 NOTE — Telephone Encounter (Signed)
RN spoke with patient to schedule palliative consult. Pt declined palliative t this time as he has weekly appts in clinic and feels like he is doing good. He can call if status changes, verbalized understanding  ?

## 2022-02-18 ENCOUNTER — Encounter: Payer: Self-pay | Admitting: Family

## 2022-02-18 ENCOUNTER — Ambulatory Visit: Payer: Medicare Other | Attending: Family | Admitting: Family

## 2022-02-18 VITALS — BP 131/60 | HR 69 | Resp 14 | Ht 67.0 in | Wt 216.2 lb

## 2022-02-18 DIAGNOSIS — Z79899 Other long term (current) drug therapy: Secondary | ICD-10-CM | POA: Insufficient documentation

## 2022-02-18 DIAGNOSIS — Z944 Liver transplant status: Secondary | ICD-10-CM | POA: Diagnosis not present

## 2022-02-18 DIAGNOSIS — K219 Gastro-esophageal reflux disease without esophagitis: Secondary | ICD-10-CM | POA: Diagnosis not present

## 2022-02-18 DIAGNOSIS — I5022 Chronic systolic (congestive) heart failure: Secondary | ICD-10-CM

## 2022-02-18 DIAGNOSIS — E1122 Type 2 diabetes mellitus with diabetic chronic kidney disease: Secondary | ICD-10-CM

## 2022-02-18 DIAGNOSIS — J449 Chronic obstructive pulmonary disease, unspecified: Secondary | ICD-10-CM

## 2022-02-18 DIAGNOSIS — Z87891 Personal history of nicotine dependence: Secondary | ICD-10-CM | POA: Insufficient documentation

## 2022-02-18 DIAGNOSIS — J45909 Unspecified asthma, uncomplicated: Secondary | ICD-10-CM | POA: Diagnosis not present

## 2022-02-18 DIAGNOSIS — N184 Chronic kidney disease, stage 4 (severe): Secondary | ICD-10-CM

## 2022-02-18 DIAGNOSIS — N189 Chronic kidney disease, unspecified: Secondary | ICD-10-CM | POA: Insufficient documentation

## 2022-02-18 DIAGNOSIS — E785 Hyperlipidemia, unspecified: Secondary | ICD-10-CM | POA: Diagnosis not present

## 2022-02-18 DIAGNOSIS — I130 Hypertensive heart and chronic kidney disease with heart failure and stage 1 through stage 4 chronic kidney disease, or unspecified chronic kidney disease: Secondary | ICD-10-CM | POA: Diagnosis present

## 2022-02-18 DIAGNOSIS — I10 Essential (primary) hypertension: Secondary | ICD-10-CM

## 2022-02-18 NOTE — Progress Notes (Signed)
? Patient ID: Brian Pacheco, male    DOB: Mar 03, 1941, 81 y.o.   MRN: [id number] ? ?HPI ? ?Brian Pacheco is a 81 y/o male with a history of asthma, DM, hyperlipidemia, HTN, CKD, COPD, GERD, PVD, liver transplant, previous alcohol/tobacco use and chronic heart failure.  ? ?Echo report from 02/06/22 reviewed and showed an EF of 40-45% along with mild LVH/ LAE and mild Brian.  ? ?Admitted 02/05/22 due to worsening shortness of breath. CTA ruled out PE. Cardiology consult obtained. Initially given IV lasix with transition to oral diuretics. Inhaled steroids increased. Myoview was negative. Elevated troponin thought to be due to poor renal excretion. PT consult obtained. Discharged after 6 days.  ? ?He presents for his initial HF visit with a chief complaint of moderate fatigue upon minimal exertion. Describes this as chronic in nature. He has associated shortness of breath, occasional wheezing and chronic back pain along with this. He denies any dizziness, difficulty sleeping, abdominal distention, palpitations, pedal edema, chest pain, cough or weight gain.  ? ?Weighing daily. Using No-Salt for his seasoning on his food.  ? ?Had his liver transplant done in 2003 and reports that he has done quite well in that regard.  ? ?Past Medical History:  ?Diagnosis Date  ? Asthma   ? Cancer [facility])   ? skin cancer  ? CHF (congestive heart failure) ([place])   ? Chronic kidney disease   ? COPD (chronic obstructive pulmonary disease) ([place])   ? Diabetes mellitus without complication ([place])   ? GERD (gastroesophageal reflux disease)   ? Hyperlipidemia   ? Hypertension   ? 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 liver replacement per pt report  ? Liver transplant status ([place])   ? Peripheral vascular disease ([place])   ? ?Past Surgical History:  ?Procedure Laterality Date  ? CAROTID ENDARTERECTOMY    ? CHOLECYSTECTOMY    ? HERNIA REPAIR    ? umbilical incarcerated  ? IR ANGIOGRAM EXTREMITY BILATERAL Bilateral   ? patient states he has stents in both legs  ?  LIVER TRANSPLANT  2003  ? LOWER EXTREMITY ANGIOGRAPHY Left 10/18/2018  ? Procedure: LOWER EXTREMITY ANGIOGRAPHY;  Surgeon: Algernon Huxley, MD;  Location: [facility];  Service: Cardiovascular;  Laterality: Left;  ? TONSILLECTOMY    ? VASECTOMY  1977  ? ?Family History  ?Problem Relation Age of Onset  ? Heart disease Father   ? ?Social History  ? ?Tobacco Use  ? Smoking status: Former  ?  Packs/day: 0.25  ?  Years: 60.00  ?  Pack years: 15.00  ?  Types: Cigarettes  ?  Quit date: 10/15/2018  ?  Years since quitting: 3.3  ? Smokeless tobacco: Never  ? Tobacco comments:  ?  currently going to smoking cessation clinic at [facility], smoked 1 ppd until recently, down to 7 a day  ?Substance Use Topics  ? Alcohol use: Not Currently  ?  Comment: history of alcohol abuse quit 2003  ? ?Allergies  ?Allergen Reactions  ? Hydralazine Rash and Shortness Of Breath  ? Fenofibrate Other (See Comments)  ? Pravastatin   ?  Other reaction(s): Other (See Comments)  ? Benazepril   ?  Other reaction(s): Other (See Comments)  ? Sitagliptin Other (See Comments) and Rash  ?  blisters ?Other reaction(s): Other (see comments) ?Other reaction(s): Other (See Comments) ?blisters ?blisters ?blisters ?Other reaction(s): Other (See Comments) ?blisters ?blisters ?  ? ?Prior to Admission medications   ?Medication Sig  Start Date End Date Taking? Authorizing Provider  ?albuterol (PROVENTIL HFA;VENTOLIN HFA) 108 (90 Base) MCG/ACT inhaler Inhale 2 puffs into the lungs every 4 (four) hours as needed.    Yes [provider]  ?amLODipine (NORVASC) 5 MG tablet Take 1 tablet (5 mg total) by mouth daily. 02/11/22 03/13/22 Yes Max Sane, MD  ?B Complex-C (B-COMPLEX WITH VITAMIN C) tablet Take 1 tablet by mouth daily.   Yes [provider]  ?bisoprolol (ZEBETA) 5 MG tablet Take 1 tablet (5 mg total) by mouth daily. 02/12/22 03/14/22 Yes Max Sane, MD  ?Cholecalciferol (VITAMIN D3) 50 MCG (2000 UT) TABS Take by mouth.   Yes [provider]  ?clopidogrel (PLAVIX) 75 MG tablet Take 75 mg by mouth daily.    Yes [provider]  ?Dupilumab 300 MG/2ML SOPN Inject 300 mg into the skin every 14 (fourteen) days.   Yes [provider]  ?ezetimibe (ZETIA) 10 MG tablet Take 1 tablet (10 mg total) by mouth daily. 02/12/22 03/14/22 Yes Max Sane, MD  ?fenofibrate 160 MG tablet Take 160 mg by mouth daily.  03/07/18  Yes [provider]  ?Fluticasone-Umeclidin-Vilant (TRELEGY ELLIPTA) 100-62.5-25 MCG/INH AEPB Inhale 1 puff into the lungs daily. 10/28/19  Yes [provider]  ?furosemide (LASIX) 20 MG tablet Take 1 tablet (20 mg total) by mouth daily. 02/11/22 03/13/22 Yes Max Sane, MD  ?glipiZIDE (GLUCOTROL) 5 MG tablet Take 5 mg by mouth 2 (two) times daily before a meal.  03/24/10  Yes [provider]  ?Melatonin 5 MG CHEW Chew by mouth.   Yes [provider]  ?metFORMIN (GLUCOPHAGE) 500 MG tablet Take 500 mg by mouth 2 (two) times daily with a meal.  03/24/10  Yes [provider]  ?Multiple Vitamins-Minerals (MENS MULTIVITAMIN) CHEW Chew by mouth.   Yes [provider]  ?mycophenolate (CELLCEPT) 250 MG capsule Take 250 mg by mouth 2 (two) times daily.   Yes [provider]  ?omega-3 acid ethyl esters (LOVAZA) 1 g capsule Take 1 g by mouth daily.   Yes [provider]  ?omeprazole (PRILOSEC) 20 MG capsule Take 20 mg by mouth daily.  08/13/10  Yes [provider]  ?rosuvastatin (CRESTOR) 5 MG tablet Take 2.5 mg by mouth at bedtime.  09/18/18  Yes [provider]  ?sildenafil (VIAGRA) 100 MG tablet Take 100 mg by mouth daily.   Yes [provider]  ?tamsulosin (FLOMAX) 0.4 MG CAPS capsule Take 0.4 mg by mouth daily.    Yes [provider]  ?venlafaxine (EFFEXOR) 75 MG tablet Take 75 mg by mouth 3 (three) times daily.    Yes [provider]  ?vitamin E 180 MG (400 UNITS) capsule Take 400 Units by mouth daily.   Yes [provider]  ? ? ?Review of Systems  ?Constitutional:  Positive for fatigue (easily). Negative for appetite change.  ?HENT:  Negative for congestion, postnasal drip and sore throat.   ?Eyes: Negative.   ?Respiratory:  Positive for shortness of breath (minimal) and wheezing (when exercising). Negative for chest tightness.   ?Cardiovascular:  Negative for chest pain, palpitations and leg swelling.  ?Gastrointestinal:  Negative for abdominal distention and abdominal pain.  ?Endocrine: Negative.   ?Genitourinary: Negative.   ?Musculoskeletal:  Positive for back pain (disc problem in back).  ?Skin: Negative.   ?Allergic/Immunologic: Negative.   ?Neurological:  Negative for dizziness and light-headedness.  ?Hematological:  Negative for adenopathy. Does not bruise/bleed easily.  ?Psychiatric/Behavioral:  Negative for  dysphoric mood and sleep disturbance (sleeping on 1 pillow). The patient is not nervous/anxious.   ? ?Vitals:  ? 02/18/22 1110  ?BP: 131/60  ?Pulse: 69  ?Resp: 14  ?SpO2: 98%  ?Weight: 216 lb 4 oz (98.1 kg)  ?Height: '5\' 7"'$  (1.702 m)  ? ?Wt Readings from Last 3 Encounters:  ?02/18/22 216 lb 4 oz (98.1 kg)  ?02/11/22 216 lb 3.2 oz (98.1 kg)  ?01/27/22 200 lb (90.7 kg)  ? ?Lab Results  ?Component Value Date  ? CREATININE 2.37 (H) 02/11/2022  ? CREATININE 2.34 (H) 02/10/2022  ? CREATININE 2.46 (H) 02/09/2022  ? ?Physical Exam ?Vitals and nursing note reviewed.  ?Constitutional:   ?   Appearance: He is well-developed.  ?HENT:  ?   Head: Normocephalic and atraumatic.  ?Neck:  ?   Vascular: No JVD.  ?Cardiovascular:  ?   Rate and Rhythm: Normal rate and regular rhythm.  ?Pulmonary:  ?   Effort: Pulmonary effort is normal.  ?   Breath sounds: No wheezing, rhonchi or rales.  ?Abdominal:  ?   Palpations: Abdomen is soft.  ?   Tenderness: There is no abdominal tenderness.  ?Musculoskeletal:  ?   Cervical back: Normal range of motion and neck supple.  ?   Right lower leg: No edema.  ?   Left lower leg: Edema (trace  pitting) present.  ?Skin: ?   General: Skin is warm and dry.  ?Neurological:  ?   General: No focal deficit present.  ?   Mental Status: He is alert and oriented to person, place, and time.  ?Psychiatric:     ?

## 2022-02-18 NOTE — Patient Instructions (Addendum)
Continue weighing daily and call for an overnight weight gain of 3 pounds or more or a weekly weight gain of more than 5 pounds.   If you have voicemail, please make sure your mailbox is cleaned out so that we may leave a message and please make sure to listen to any voicemails.     

## 2022-03-01 ENCOUNTER — Ambulatory Visit (INDEPENDENT_AMBULATORY_CARE_PROVIDER_SITE_OTHER): Payer: Medicare Other | Admitting: Nurse Practitioner

## 2022-03-01 ENCOUNTER — Encounter (INDEPENDENT_AMBULATORY_CARE_PROVIDER_SITE_OTHER): Payer: Self-pay

## 2022-03-01 ENCOUNTER — Ambulatory Visit (INDEPENDENT_AMBULATORY_CARE_PROVIDER_SITE_OTHER): Payer: Medicare Other

## 2022-03-01 DIAGNOSIS — I739 Peripheral vascular disease, unspecified: Secondary | ICD-10-CM | POA: Diagnosis not present

## 2022-03-07 NOTE — Progress Notes (Signed)
? ?Cardiology Office Note   ? ?Date:  03/08/2022  ? ?ID:  Brian Pacheco, DOB 06-15-1941, MRN [id number] ? ?PCP:  Derinda Late, MD  ?Cardiologist:  Nelva Bush, MD  ?Electrophysiologist:  None  ? ?Chief Complaint: Hospital follow-up ? ?History of Present Illness:  ? ?Brian Pacheco is a 81 y.o. 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 ? ?He was admitted to [facility] from 4/8 to 4/14 for dyspnea and volume overload.  CTA of the chest was negative for PE, though showed bilateral pulmonary edema.  High-sensitivity troponin peaked at 143.  BNP 236.  Echo showed an EF of 40 to 45%, global hypokinesis, mild concentric LVH, grade 3 diastolic dysfunction, normal RV systolic function with mildly enlarged ventricular cavity size, mild biatrial enlargement, mild mitral regurgitation, mild tricuspid stenosis, aortic valve sclerosis without evidence of stenosis, and an estimated right atrial pressure of 3 mmHg.  However, upon [place] reviewing the images, EF was felt to be closer to 50 to 55%.  R/LHC was deferred given acute on CKD stage III.  He subsequently underwent Lexiscan MPI during the admission which showed no significant ischemia with an EF of 29% which was felt to be depressed secondary to GI uptake artifact.  Overall, this was a moderate risk scan in the setting of depressed EF.  He was diuresed with symptomatic improvement.  Medications were optimized as able. ? ?He comes in feeling very well from a cardiac perspective.  He is without symptoms of angina, dyspnea, palpitations, dizziness, presyncope, or syncope.  He has significantly changed his diet and has cut out most salt.  With this, he feels much  better.  He reports his weight is down approximately 15 pounds.  No significant lower extremity swelling, abdominal distention, orthopnea, PND, early satiety. ? ? ?Labs independently reviewed: ?01/2022 - potassium 4.4, BUN 49, serum creatinine 2.37, Hgb 13.8, PLT 231, A1c 7.8, TSH normal, TC 219, TG 134, HDL 40, LDL 152, magnesium 1.3, albumin 3.5, ALT/AST normal ? ?Past Medical History:  ?Diagnosis Date  ? Asthma   ? Cancer [facility])   ? skin cancer  ? CHF (congestive heart failure) ([place])   ? Chronic kidney disease   ? COPD (chronic obstructive pulmonary disease) ([place])   ? Diabetes mellitus without complication ([place])   ? GERD (gastroesophageal reflux disease)   ? Hyperlipidemia   ? Hypertension   ? 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 liver replacement per pt report  ? Liver transplant status ([place])   ? Peripheral vascular disease ([place])   ? ? ?Past Surgical History:  ?Procedure Laterality Date  ? CAROTID ENDARTERECTOMY    ? CHOLECYSTECTOMY    ? HERNIA REPAIR    ? umbilical incarcerated  ? IR ANGIOGRAM EXTREMITY BILATERAL Bilateral   ? patient states he has stents in both legs  ? LIVER TRANSPLANT  2003  ? LOWER EXTREMITY ANGIOGRAPHY Left 10/18/2018  ? Procedure: LOWER EXTREMITY ANGIOGRAPHY;  Surgeon: Algernon Huxley, MD;  Location: [place] [facility];  Service: Cardiovascular;  Laterality: Left;  ? TONSILLECTOMY    ? VASECTOMY  1977  ? ? ?Current Medications: ?Current Meds  ?Medication Sig  ? albuterol (PROVENTIL HFA;VENTOLIN HFA) 108 (90  Base) MCG/ACT inhaler Inhale 2 puffs into the lungs every 4 (four) hours as needed.   ? amLODipine (NORVASC) 5 MG tablet Take 1 tablet (5 mg total) by mouth daily.  ? B Complex-C (B-COMPLEX WITH VITAMIN C) tablet Take 1 tablet by mouth. Three times per week  ? bisoprolol (ZEBETA) 5 MG tablet Take 1 tablet (5 mg total) by mouth daily.  ? Cholecalciferol (VITAMIN D3) 50 MCG (2000 UT) TABS Take by mouth every other day.  ? clopidogrel (PLAVIX) 75 MG tablet Take 75 mg by mouth daily.   ?  Dupilumab 300 MG/2ML SOPN Inject 300 mg into the skin every 14 (fourteen) days.  ? ezetimibe (ZETIA) 10 MG tablet Take 1 tablet (10 mg total) by mouth daily.  ? fenofibrate 160 MG tablet Take 160 mg by mouth daily.   ? Fluticasone-Umeclidin-Vilant (TRELEGY ELLIPTA) 100-62.5-25 MCG/INH AEPB Inhale 1 puff into the lungs daily.  ? furosemide (LASIX) 20 MG tablet Take 1 tablet (20 mg total) by mouth daily.  ? glipiZIDE (GLUCOTROL) 5 MG tablet Take 5 mg by mouth 2 (two) times daily before a meal.   ? Melatonin 5 MG CHEW Chew 5 mg by mouth at bedtime as needed.  ? metFORMIN (GLUCOPHAGE) 500 MG tablet Take 500 mg by mouth 2 (two) times daily with a meal.   ? Multiple Vitamins-Minerals (MENS MULTIVITAMIN) CHEW Chew by mouth. Takes 2-3 days per week  ? mycophenolate (CELLCEPT) 250 MG capsule Take 250 mg by mouth 2 (two) times daily.  ? omega-3 acid ethyl esters (LOVAZA) 1 g capsule Take 1 g by mouth daily.  ? omeprazole (PRILOSEC) 20 MG capsule Take 20 mg by mouth daily.   ? rosuvastatin (CRESTOR) 5 MG tablet Take 2.5 mg by mouth at bedtime.   ? sildenafil (VIAGRA) 100 MG tablet Take 100 mg by mouth daily.  ? tamsulosin (FLOMAX) 0.4 MG CAPS capsule Take 0.4 mg by mouth daily.   ? venlafaxine (EFFEXOR) 75 MG tablet Take 75 mg by mouth 3 (three) times daily.   ? vitamin E 180 MG (400 UNITS) capsule Take 400 Units by mouth. Takes 3 days per week  ? ? ?Allergies:   Hydralazine, Fenofibrate, Pravastatin, Benazepril, and Sitagliptin  ? ?Social History  ? ?Socioeconomic History  ? Marital status: Married  ?  Spouse name: Not on file  ? Number of children: Not on file  ? Years of education: Not on file  ? Highest education level: Not on file  ?Occupational History  ? Not on file  ?Tobacco Use  ? Smoking status: Former  ?  Packs/day: 0.25  ?  Years: 60.00  ?  Pack years: 15.00  ?  Types: Cigarettes  ?  Quit date: 10/15/2018  ?  Years since quitting: 3.3  ? Smokeless tobacco: Never  ? Tobacco comments:  ?  currently going to smoking  cessation clinic at [facility], smoked 1 ppd until recently, down to 7 a day  ?Substance and Sexual Activity  ? Alcohol use: Not Currently  ?  Comment: history of alcohol abuse quit 2003  ? Drug use: Never  ? Sexual activity: Not on file  ?Other Topics Concern  ? Not on file  ?Social History Narrative  ? Not on file  ? ?Social Determinants of Health  ? ?Financial Resource Strain: Not on file  ?Food Insecurity: Not on file  ?Transportation Needs: Not on file  ?Physical Activity: Not on file  ?Stress: Not on file  ?Social Connections: Not on file  ?  ? ?  Family History:  ?The patient's family history includes Heart disease in his father. ? ?ROS:   ?12-point review of systems is negative unless otherwise noted in the HPI. ? ? ?EKGs/Labs/Other Studies Reviewed:   ? ?Studies reviewed were summarized above. The additional studies were reviewed today: ? ?Lexiscan MPI 02/10/2022: ?Pharmacological myocardial perfusion imaging study with no significant  ischemia ?Fixed defects in the distal anteroseptal, apical region, ?Fixed inferior wall defect, unable to exclude diaphragmatic attenuation artifact ?Normal wall motion, EF estimated at 29% (likely depressed secondary to GI uptake artifact) ?No EKG changes concerning for ischemia at peak stress or in recovery. ?Attenuation correction images not available ?Moderate risk scan in the setting of depressed ejection fraction ?__________ ? ?2D echo 02/06/2022: ?1. Left ventricular ejection fraction, by estimation, is 40 to 45%. The  ?left ventricle has mildly decreased function. The left ventricle  ?demonstrates global hypokinesis. There is mild concentric left ventricular  ?hypertrophy. Left ventricular diastolic  ?parameters are consistent with Grade III diastolic dysfunction  ?(restrictive).  ? 2. Right ventricular systolic function is normal. The right ventricular  ?size is mildly enlarged.  ? 3. Left atrial size was mildly dilated.  ? 4. Right atrial size was mildly dilated.  ? 5. The  mitral valve is normal in structure. Mild mitral valve  ?regurgitation. No evidence of mitral stenosis.  ? 6. Mild tricuspid stenosis.  ? 7. The aortic valve is normal in structure. Aortic valve regurgitat

## 2022-03-08 ENCOUNTER — Ambulatory Visit: Payer: Medicare Other | Admitting: Physician Assistant

## 2022-03-08 ENCOUNTER — Telehealth: Payer: Self-pay | Admitting: *Deleted

## 2022-03-08 ENCOUNTER — Other Ambulatory Visit
Admission: RE | Admit: 2022-03-08 | Discharge: 2022-03-08 | Disposition: A | Discharge status: Home / Self Care | Payer: Medicare Other | Source: Ambulatory Visit | Attending: Physician Assistant | Admitting: Physician Assistant

## 2022-03-08 VITALS — BP 130/68 | HR 64 | Ht 67.0 in | Wt 218.0 lb

## 2022-03-08 DIAGNOSIS — Z944 Liver transplant status: Secondary | ICD-10-CM

## 2022-03-08 DIAGNOSIS — I739 Peripheral vascular disease, unspecified: Secondary | ICD-10-CM

## 2022-03-08 DIAGNOSIS — I2584 Coronary atherosclerosis due to calcified coronary lesion: Secondary | ICD-10-CM

## 2022-03-08 DIAGNOSIS — I6529 Occlusion and stenosis of unspecified carotid artery: Secondary | ICD-10-CM | POA: Diagnosis not present

## 2022-03-08 DIAGNOSIS — I2510 Atherosclerotic heart disease of native coronary artery without angina pectoris: Secondary | ICD-10-CM

## 2022-03-08 DIAGNOSIS — I5042 Chronic combined systolic (congestive) and diastolic (congestive) heart failure: Secondary | ICD-10-CM

## 2022-03-08 DIAGNOSIS — E875 Hyperkalemia: Secondary | ICD-10-CM

## 2022-03-08 DIAGNOSIS — E782 Mixed hyperlipidemia: Secondary | ICD-10-CM

## 2022-03-08 DIAGNOSIS — N183 Chronic kidney disease, stage 3 unspecified: Secondary | ICD-10-CM

## 2022-03-08 DIAGNOSIS — I10 Essential (primary) hypertension: Secondary | ICD-10-CM

## 2022-03-08 LAB — BASIC METABOLIC PANEL
Anion gap: 5 (ref 5–15)
BUN: 53 mg/dL — ABNORMAL HIGH (ref 8–23)
CO2: 22 mmol/L (ref 22–32)
Calcium: 9.3 mg/dL (ref 8.9–10.3)
Chloride: 111 mmol/L (ref 98–111)
Creatinine, Ser: 2.17 mg/dL — ABNORMAL HIGH (ref 0.61–1.24)
GFR, Estimated: 30 mL/min — ABNORMAL LOW (ref >60)
Glucose, Bld: 129 mg/dL — ABNORMAL HIGH (ref 70–99)
Potassium: 5.7 mmol/L — ABNORMAL HIGH (ref 3.5–5.1)
Sodium: 138 mmol/L (ref 135–145)

## 2022-03-08 NOTE — Addendum Note (Signed)
Addended by: Valora Corporal on: 03/08/2022 02:08 PM ? ? Modules accepted: Orders ? ?

## 2022-03-08 NOTE — Telephone Encounter (Signed)
-----   Message from Rise Mu, PA-C sent at 03/08/2022 10:43 AM EDT ----- ?Please inform the patient his renal function is improved from prior, though appears to still remain above baseline.  He is also noted to be hyperkalemic with a potassium of 5.7.  He is on CellCept in the setting of prior liver transplant, this medication can lead to hyperkalemia.  I recommend he contact his liver transplant team today for further recommendations.  He is not on any cardiac medications that would lead to hyperkalemia. ?

## 2022-03-08 NOTE — Patient Instructions (Signed)
Medication Instructions:  ?No changes at this time.  ? ?*If you need a refill on your cardiac medications before your next appointment, please call your pharmacy* ? ? ?Lab Work: ?BMET today. Go to [facility] entrance and check in at registration.  ? ? ?If you have labs (blood work) drawn today and your tests are completely normal, you will receive your results only by: ?MyChart Message (if you have MyChart) OR ?A paper copy in the mail ?If you have any lab test that is abnormal or we need to change your treatment, we will call you to review the results. ? ? ?Testing/Procedures: ?None ? ? ?Follow-Up: ?At [facility], you and your health needs are our priority.  As part of our continuing mission to provide you with exceptional heart care, we have created designated Provider Care Teams.  These Care Teams include your primary Cardiologist (physician) and Advanced Practice Providers (APPs -  Physician Assistants and Nurse Practitioners) who all work together to provide you with the care you need, when you need it. ? ?We recommend signing up for the patient portal called "MyChart".  Sign up information is provided on this After Visit Summary.  MyChart is used to connect with patients for Virtual Visits (Telemedicine).  Patients are able to view lab/test results, encounter notes, upcoming appointments, etc.  Non-urgent messages can be sent to your provider as well.   ?To learn more about what you can do with MyChart, go to [email or web address].   ? ?Your next appointment:   ?4 month(s) ? ?The format for your next appointment:   ?In Person ? ?Provider:   ?Nelva Bush, MD or Christell Faith, PA-C  ? ? ? ? ? ? ?Important Information About Sugar ? ? ? ? ?  ?

## 2022-03-08 NOTE — Telephone Encounter (Signed)
Reviewed results and recommendations with patient. He did admit that since cutting out sodium in his diet he has been using salt substitutes. Instructed him to stop using those due to potassium. Recommended that he call transplant team to determine any further recommendations they may have. He verbalized understanding of our conversation with no further questions at this time.  ?

## 2022-03-08 NOTE — Telephone Encounter (Signed)
Reviewed recommendations to have labs checked in one week to see if stopping the salt substitutes will help improve his potassium level. He was very upset that he had to walk over to [facility] for labs today and stated that he called and complained. Advised I would update our manager as well. Reviewed need for repeat labs in one week and he was upset. We discussed the need to have these done. He verbalized understanding with no further questions at this time.   ?

## 2022-03-08 NOTE — Telephone Encounter (Signed)
Spoke with [facility] transplant coordinator to review lab results and recommendations to give them a call. She states patient had transplant over 19 years ago. We discussed results and that he is not on any cardiac medications which could cause these changes in his labs. She took down all information we discussed and will review with provider there. She did request number to call back if provider would like to discuss provider to provider. Provided our main number and also number where I am sitting for today. She also requested we send over our lab results via Fax to her attention. Sent via [employer] and will also print and fax as well.   ?

## 2022-03-15 ENCOUNTER — Other Ambulatory Visit
Admission: RE | Admit: 2022-03-15 | Discharge: 2022-03-15 | Disposition: A | Discharge status: Home / Self Care | Payer: Medicare Other | Attending: Physician Assistant | Admitting: Physician Assistant

## 2022-03-15 DIAGNOSIS — I2584 Coronary atherosclerosis due to calcified coronary lesion: Secondary | ICD-10-CM | POA: Insufficient documentation

## 2022-03-15 DIAGNOSIS — I2510 Atherosclerotic heart disease of native coronary artery without angina pectoris: Secondary | ICD-10-CM | POA: Diagnosis present

## 2022-03-15 DIAGNOSIS — I5042 Chronic combined systolic (congestive) and diastolic (congestive) heart failure: Secondary | ICD-10-CM | POA: Diagnosis present

## 2022-03-15 DIAGNOSIS — I6529 Occlusion and stenosis of unspecified carotid artery: Secondary | ICD-10-CM | POA: Insufficient documentation

## 2022-03-15 DIAGNOSIS — E875 Hyperkalemia: Secondary | ICD-10-CM | POA: Insufficient documentation

## 2022-03-15 LAB — BASIC METABOLIC PANEL
Anion gap: 9 (ref 5–15)
BUN: 42 mg/dL — ABNORMAL HIGH (ref 8–23)
CO2: 22 mmol/L (ref 22–32)
Calcium: 8.7 mg/dL — ABNORMAL LOW (ref 8.9–10.3)
Chloride: 107 mmol/L (ref 98–111)
Creatinine, Ser: 2.1 mg/dL — ABNORMAL HIGH (ref 0.61–1.24)
GFR, Estimated: 32 mL/min — ABNORMAL LOW (ref >60)
Glucose, Bld: 185 mg/dL — ABNORMAL HIGH (ref 70–99)
Potassium: 5.4 mmol/L — ABNORMAL HIGH (ref 3.5–5.1)
Sodium: 138 mmol/L (ref 135–145)

## 2022-03-18 ENCOUNTER — Encounter (INDEPENDENT_AMBULATORY_CARE_PROVIDER_SITE_OTHER): Payer: Self-pay | Admitting: *Deleted

## 2022-04-13 ENCOUNTER — Ambulatory Visit: Payer: Medicare Other | Admitting: Family

## 2022-04-13 IMAGING — US US AORTA
1 series · 14 of 25 positions shown · non-contrast
Comparison: MRI of the lumbar spine on 07/21/2021

CLINICAL DATA: Mild dilatation of the abdominal aorta by MRI of the
lumbar spine.

EXAM:
ULTRASOUND OF ABDOMINAL AORTA
TECHNIQUE: Ultrasound examination of the abdominal aorta and proximal common
iliac arteries was performed to evaluate for aneurysm. Additional
color and Doppler images of the distal aorta were obtained to
document patency.

[Series 1: us aorta duplex limited · 14 of 25 slices shown]
[im 1/25]
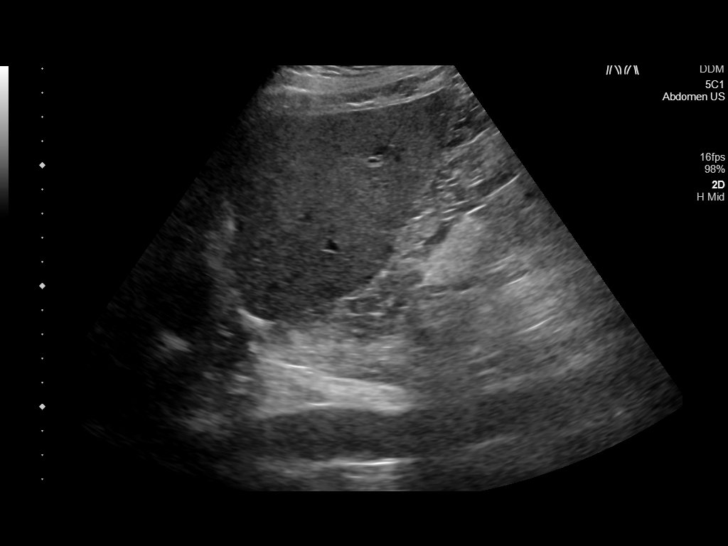
[im 3/25]
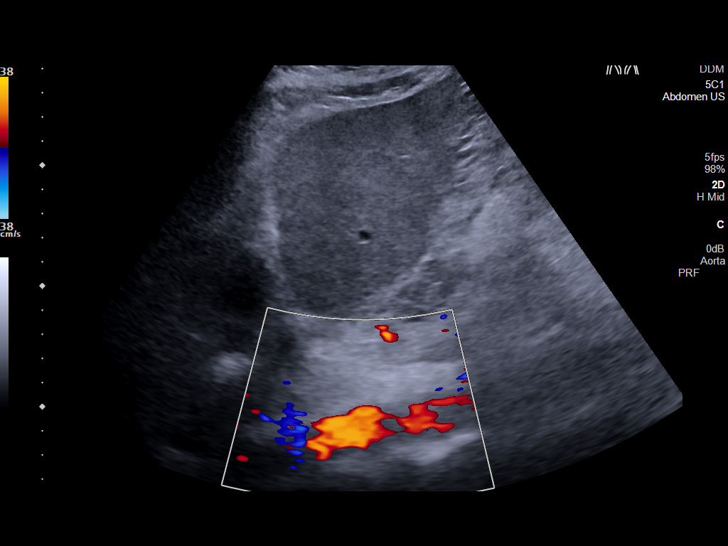
[im 5/25]
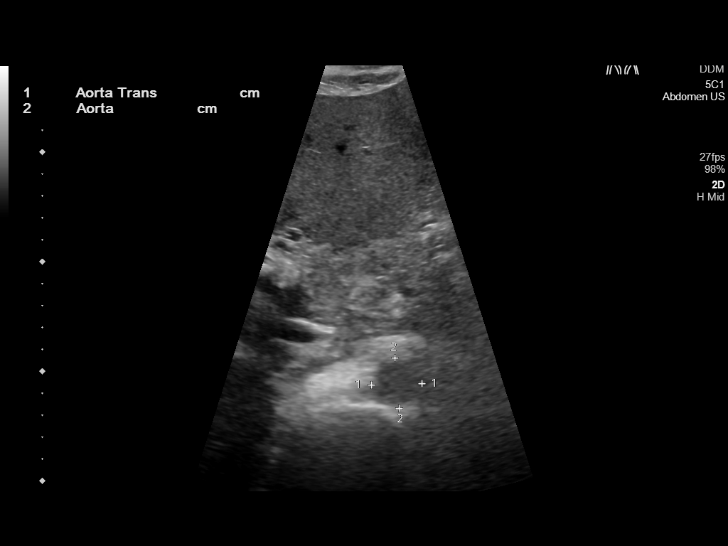
[im 7/25]
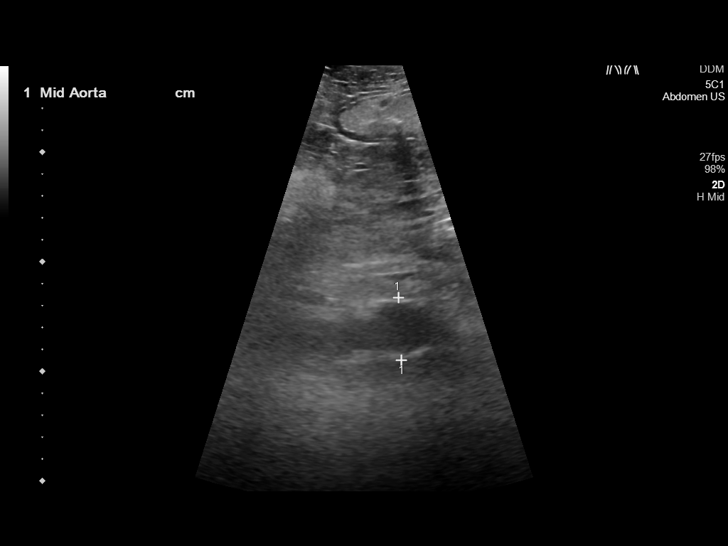
[im 9/25]
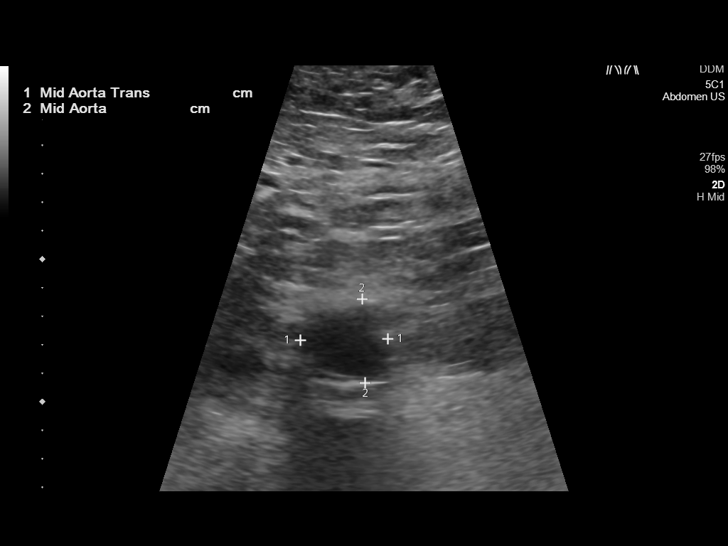
[im 10/25]
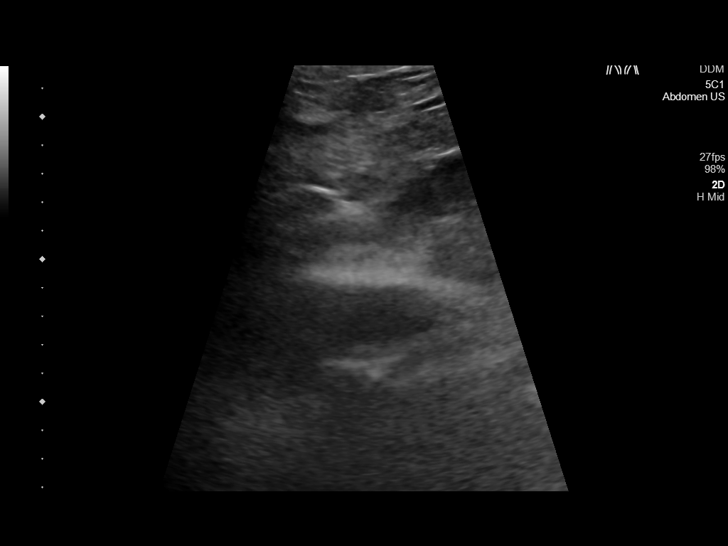
[im 12/25]
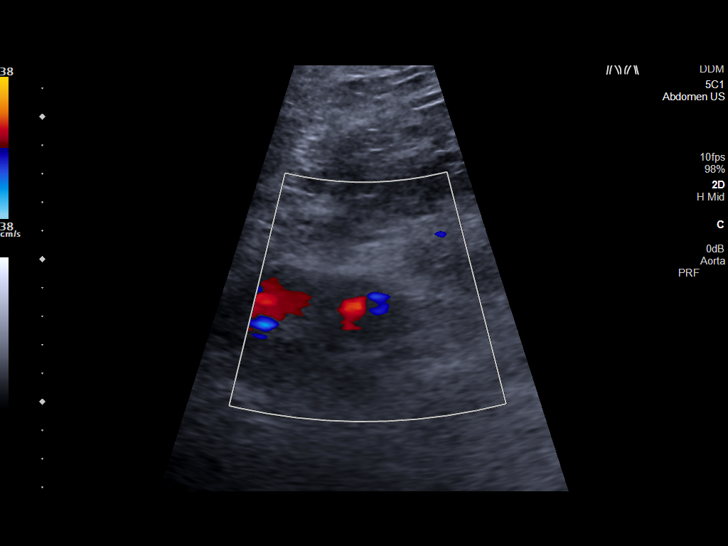
[im 14/25]
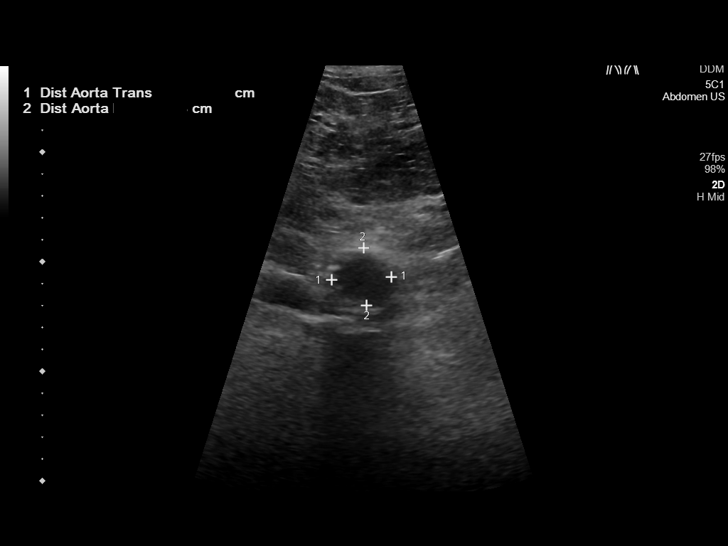
[im 16/25]
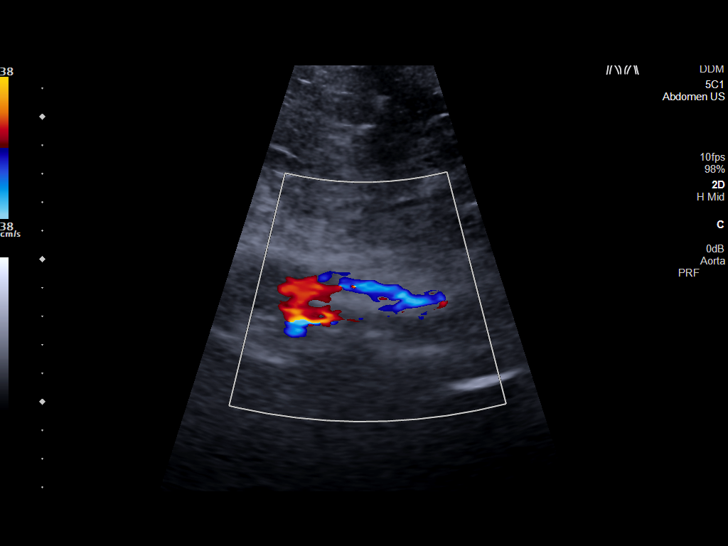
[im 17/25]
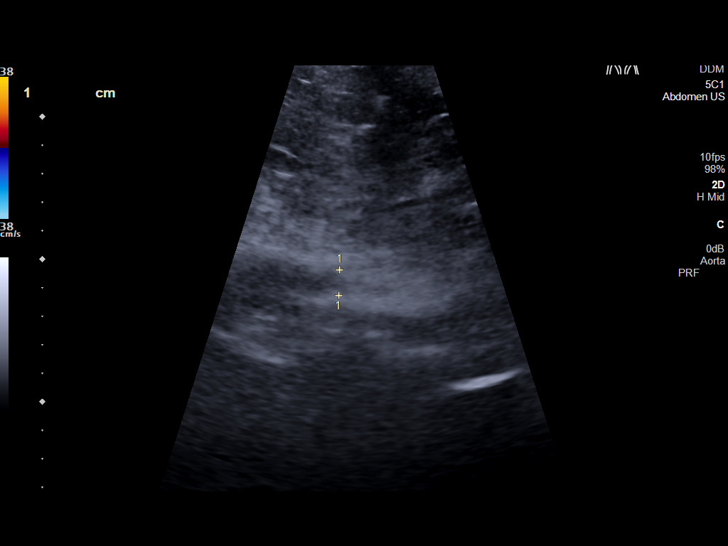
[im 19/25]
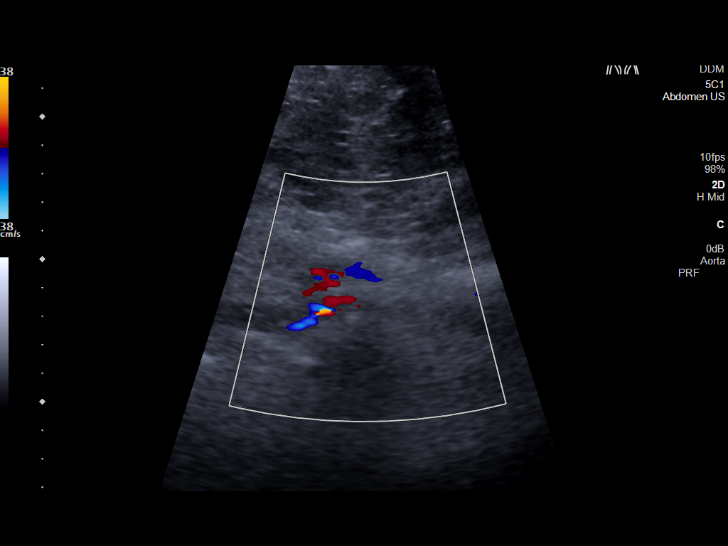
[im 21/25]
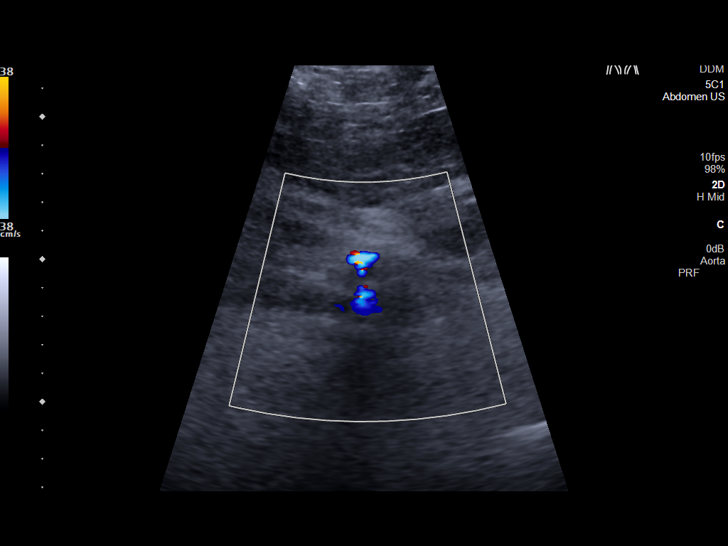
[im 23/25]
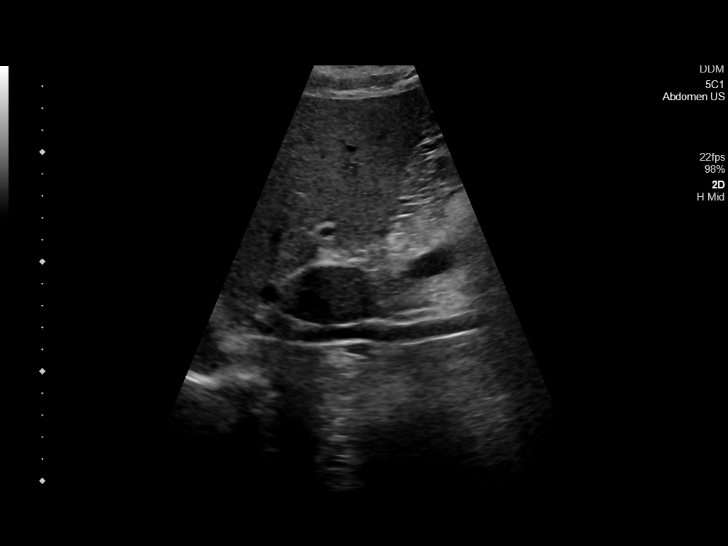
[im 25/25]
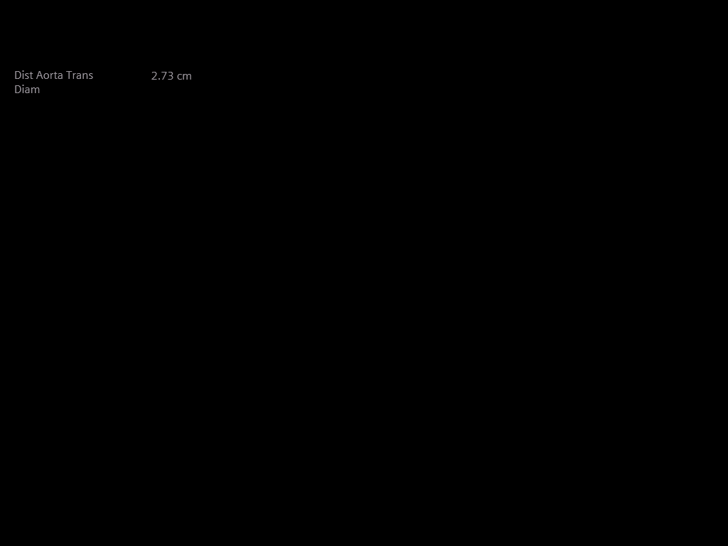

[14 of 25 positions shown; findings below may reference images not displayed]

FINDINGS: Abdominal aortic measurements as follows:

Proximal:  2.3 x 2.3 cm

Mid:  2.9 x 3.1 cm

Distal:  2.6 x 2.7 cm
Patent: Yes, peak systolic velocity is 76 cm/s

The abdominal aorta is not optimally visualized by ultrasound. Based
on rough dimensions, the mid abdominal aorta may be
borderline/mildly dilated.

Right common iliac artery: 1.1 cm

Left common iliac artery: 1.0 cm
IMPRESSION: Borderline dilatation of the abdominal aorta measuring up to
approximately 3.1 cm by aortic ultrasound. The current study is not
optimal for visualization of the abdominal aorta.

Recommend follow-up every 3 years.

Reference: [HOSPITAL] 2129;[DATE].

## 2022-05-05 ENCOUNTER — Telehealth: Payer: Self-pay | Admitting: Family

## 2022-05-05 ENCOUNTER — Ambulatory Visit: Payer: Medicare Other | Admitting: Family

## 2022-05-05 NOTE — Telephone Encounter (Signed)
Patient did not show for his Heart Failure Clinic appointment on 05/05/22. Will attempt to reschedule.

## 2022-05-05 NOTE — Progress Notes (Deleted)
Patient ID: Brian Pacheco, male    DOB: [birth year], 81 y.o.   MRN: [id number]  HPI  Brian Pacheco is a 81 y/o male with a history of asthma, DM, hyperlipidemia, HTN, CKD, COPD, GERD, PVD, liver transplant, previous alcohol/tobacco use and chronic heart failure.   Echo report from 02/06/22 reviewed and showed an EF of 40-45% along with mild LVH/ LAE and mild Brian.   Admitted 02/05/22 due to worsening shortness of breath. CTA ruled out PE. Cardiology consult obtained. Initially given IV lasix with transition to oral diuretics. Inhaled steroids increased. Myoview was negative. Elevated troponin thought to be due to poor renal excretion. PT consult obtained. Discharged after 6 days.   He presents for a follow-up HF visit with a chief complaint of   Had his liver transplant done in 2003 and reports that he has done quite well in that regard.   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[place])    liver replacement per pt report   Liver transplant status ([place])    Peripheral vascular disease ([place])    Past Surgical History:  Procedure Laterality Date   CAROTID ENDARTERECTOMY     CHOLECYSTECTOMY     HERNIA REPAIR     umbilical incarcerated   IR ANGIOGRAM EXTREMITY BILATERAL Bilateral    patient states he has stents in both legs   LIVER TRANSPLANT  2003   LOWER EXTREMITY ANGIOGRAPHY Left 10/18/2018   Procedure: LOWER EXTREMITY ANGIOGRAPHY;  Surgeon: Algernon Huxley, MD;  Location: [place] [facility];  Service: Cardiovascular;  Laterality: Left;   TONSILLECTOMY     VASECTOMY  1977   Family History  Problem Relation Age of Onset   Heart disease Father    Social History   Tobacco Use    Smoking status: Former    Packs/day: 0.25    Years: 60.00    Total pack years: 15.00    Types: Cigarettes    Quit date: 10/15/2018    Years since quitting: 3.5   Smokeless tobacco: Never   Tobacco comments:    currently going to smoking cessation clinic at [facility], smoked 1 ppd until recently, down to 7 a day  Substance Use Topics   Alcohol use: Not Currently    Comment: history of alcohol abuse quit 2003   Allergies  Allergen Reactions   Hydralazine Rash and Shortness Of Breath   Fenofibrate Other (See Comments)   Pravastatin     Other reaction(s): Other (See Comments)   Benazepril     Other reaction(s): Other (See Comments)   Sitagliptin Other (See Comments) and Rash    blisters Other reaction(s): Other (see comments) Other reaction(s): Other (See Comments) blisters blisters blisters Other reaction(s): Other (See Comments) blisters blisters      Review of Systems  Constitutional:  Positive for fatigue (easily). Negative for appetite change.  HENT:  Negative for congestion, postnasal drip and sore throat.   Eyes: Negative.   Respiratory:  Positive for shortness of breath (minimal) and wheezing (when exercising). Negative for chest tightness.   Cardiovascular:  Negative for chest pain, palpitations and leg swelling.  Gastrointestinal:  Negative for abdominal distention and abdominal pain.  Endocrine: Negative.   Genitourinary: Negative.   Musculoskeletal:  Positive for back pain (disc problem in back).  Skin: Negative.   Allergic/Immunologic: Negative.   Neurological:  Negative for dizziness and light-headedness.  Hematological:  Negative for adenopathy. Does not bruise/bleed easily.  Psychiatric/Behavioral:  Negative for dysphoric mood and sleep disturbance (sleeping on 1 pillow). The patient is not nervous/anxious.       Physical Exam Vitals and nursing note reviewed.  Constitutional:      Appearance: He is well-developed.  HENT:     Head: Normocephalic  and atraumatic.  Neck:     Vascular: No JVD.  Cardiovascular:     Rate and Rhythm: Normal rate and regular rhythm.  Pulmonary:     Effort: Pulmonary effort is normal.     Breath sounds: No wheezing, rhonchi or rales.  Abdominal:     Palpations: Abdomen is soft.     Tenderness: There is no abdominal tenderness.  Musculoskeletal:     Cervical back: Normal range of motion and neck supple.     Right lower leg: No edema.     Left lower leg: Edema (trace pitting) present.  Skin:    General: Skin is warm and dry.  Neurological:     General: No focal deficit present.     Mental Status: He is alert and oriented to person, place, and time.  Psychiatric:        Mood and Affect: Mood normal.        Behavior: Behavior normal.    Assessment & Plan:  1: Chronic heart failure with reduced ejection fraction- - NYHA class III - euvolemic today - weighing daily; reminded to call for an overnight weight gain of > 2 pounds or a weekly weight gain of > 5 pounds - weight 216.4 from last visit here 3 months ago - not adding salt and using NoSalt for seasoning; reviewed the importance of keeping daily sodium intake to ~ '2000mg'$  - not on GDMT - renal function  - had blisters with sitagliptin - saw cardiology ([place]) 03/08/22 - BNP 02/05/22 was 236.9  2: HTN with CKD- - BP - saw nephrology Candiss Norse) 02/15/22 - BMP 03/15/22 reviewed and showed sodium 138, potassium 5.4, creatinine 2.10 & GFR 32  3: DM- - saw PCP Baldemar Lenis) 02/17/22 - A1c 02/06/22 was 7.8% - glucose at home today was   4: S/P liver transplant- - on mycophenolate (Cellcept) - saw liver transplant provider 03/01/22  5: COPD- - saw pulmonology Lanney Gins) 09/16/20 - on dupilumab and trelegy   Medication bottles reviewed.

## 2022-07-11 ENCOUNTER — Encounter: Payer: Self-pay | Admitting: Physician Assistant

## 2022-07-11 ENCOUNTER — Ambulatory Visit: Payer: Medicare Other | Attending: Physician Assistant | Admitting: Physician Assistant

## 2022-07-11 NOTE — Progress Notes (Deleted)
Cardiology Office Note    Date:  07/11/2022   ID:  Remo Kirschenmann, DOB 1940/11/17, MRN [id number]  PCP:  Derinda Late, MD  Cardiologist:  Nelva Bush, MD  Electrophysiologist:  None   Chief Complaint: Follow-up  History of Present Illness:   Brian Pacheco is a 81 y.o. 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   He was admitted to [facility] in 01/2022 for dyspnea and volume overload.  CTA of the chest was negative for PE, though showed bilateral pulmonary edema.  High-sensitivity troponin peaked at 143.  BNP 236.  Echo showed an EF of 40 to 45%, global hypokinesis, mild concentric LVH, grade 3 diastolic dysfunction, normal RV systolic function with mildly enlarged ventricular cavity size, mild biatrial enlargement, mild mitral regurgitation, mild tricuspid stenosis, aortic valve sclerosis without evidence of stenosis, and an estimated right atrial pressure of 3 mmHg.  However, upon [place] reviewing the images, EF was felt to be closer to 50 to 55%.  R/LHC was deferred given acute on CKD stage III.  He subsequently underwent Lexiscan MPI during the admission which showed no significant ischemia with an EF of 29% which was felt to be depressed secondary to GI uptake artifact.  Overall, this was a moderate risk scan in the setting of depressed EF.  He was diuresed with symptomatic improvement.  Medications were optimized as able.  He was last seen in the office in 02/2022 and was doing very well from a cardiac perspective, without symptoms of angina or decompensation.  He had significantly improved his diet.  With this, his weight was down approximately 15 pounds.   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[place])    liver replacement per pt report   Liver transplant status ([place])    Peripheral vascular disease ([place])     Past Surgical History:  Procedure Laterality Date   CAROTID ENDARTERECTOMY     CHOLECYSTECTOMY     HERNIA REPAIR     umbilical incarcerated   IR ANGIOGRAM EXTREMITY BILATERAL Bilateral    patient states he has stents in both legs   LIVER TRANSPLANT  2003   LOWER EXTREMITY ANGIOGRAPHY Left 10/18/2018   Procedure: LOWER EXTREMITY ANGIOGRAPHY;  Surgeon: Algernon Huxley, MD;  Location: [facility];  Service: Cardiovascular;  Laterality: Left;   TONSILLECTOMY     VASECTOMY  1977    Current Medications: No outpatient medications have been marked as taking for the 07/11/22 encounter (Appointment) with Rise Mu, PA-C.    Allergies:   Hydralazine, Fenofibrate, Pravastatin, Benazepril, and Sitagliptin   Social History  Socioeconomic History   Marital status: Married    Spouse name: Not on file   Number of children: Not on file   Years of education: Not on file   Highest education level: Not on file  Occupational History   Not on file  Tobacco Use   Smoking status: Former    Packs/day: 0.25    Years: 60.00    Total pack years: 15.00    Types: Cigarettes    Quit date: 10/15/2018    Years since quitting: 3.7   Smokeless tobacco: Never   Tobacco comments:    currently going to smoking cessation clinic  at [facility], smoked 1 ppd until recently, down to 7 a day  Substance and Sexual Activity   Alcohol use: Not Currently    Comment: history of alcohol abuse quit 2003   Drug use: Never   Sexual activity: Not on file  Other Topics Concern   Not on file  Social History Narrative   Not on file   Social Determinants of Health   Financial Resource Strain: Not on file  Food Insecurity: Not on file  Transportation Needs: Not on file  Physical Activity: Not on file  Stress: Not on file  Social Connections: Not on file     Family History:  The patient's family history includes Heart disease in his father.  ROS:   ROS   EKGs/Labs/Other Studies Reviewed:    Studies reviewed were summarized above. The additional studies were reviewed today: ***  EKG:  EKG is ordered today.  The EKG ordered today demonstrates ***  Recent Labs: 02/05/2022: B Natriuretic Peptide 236.9 02/06/2022: ALT 19; Magnesium 1.3; TSH 0.524 02/11/2022: Hemoglobin 13.8; Platelets 231 03/15/2022: BUN 42; Creatinine, Ser 2.10; Potassium 5.4; Sodium 138  Recent Lipid Panel    Component Value Date/Time   CHOL 219 (H) 02/06/2022 0424   TRIG 134 02/06/2022 0424   HDL 40 (L) 02/06/2022 0424   CHOLHDL 5.5 02/06/2022 0424   VLDL 27 02/06/2022 0424   LDLCALC 152 (H) 02/06/2022 0424    PHYSICAL EXAM:    VS:  There were no vitals taken for this visit.  BMI: There is no height or weight on file to calculate BMI.  Physical Exam  Wt Readings from Last 3 Encounters:  03/08/22 218 lb (98.9 kg)  02/18/22 216 lb 4 oz (98.1 kg)  02/11/22 216 lb 3.2 oz (98.1 kg)     ASSESSMENT & PLAN:   Chronic combined systolic and diastolic CHF with NICM:  Coronary artery calcification:  HTN: Blood pressure  HLD: LDL 152.  PAD:  Carotid artery disease: Status post left-sided CEA.  CKD stage III:  History of liver transplant: Followed by [facility].   {Are you ordering a CV Procedure (e.g. stress test, cath, DCCV, TEE,  etc)?   Press F2        :[id number]}     Disposition: F/u with Dr. Saunders Revel or an APP in ***.   Medication Adjustments/Labs and Tests Ordered: Current medicines are reviewed at length with the patient today.  Concerns regarding medicines are outlined above. Medication changes, Labs and Tests ordered today are summarized above and listed in the Patient Instructions accessible in Encounters.   Signed, Christell Faith, PA-C 07/11/2022 6:46 AM     [address] [place], [place] [address] [phone]

## 2022-08-11 ENCOUNTER — Ambulatory Visit: Payer: Medicare Other | Admitting: Internal Medicine

## 2022-08-26 ENCOUNTER — Emergency Department
Admission: EM | Admit: 2022-08-26 | Discharge: 2022-08-26 | Disposition: A | Discharge status: Home / Self Care | Payer: Medicare Other | Attending: Emergency Medicine | Admitting: Emergency Medicine

## 2022-08-26 ENCOUNTER — Emergency Department: Payer: Medicare Other

## 2022-08-26 ENCOUNTER — Other Ambulatory Visit: Payer: Self-pay

## 2022-08-26 DIAGNOSIS — R0981 Nasal congestion: Secondary | ICD-10-CM | POA: Diagnosis present

## 2022-08-26 DIAGNOSIS — Z1152 Encounter for screening for COVID-19: Secondary | ICD-10-CM | POA: Insufficient documentation

## 2022-08-26 DIAGNOSIS — J441 Chronic obstructive pulmonary disease with (acute) exacerbation: Secondary | ICD-10-CM | POA: Diagnosis not present

## 2022-08-26 LAB — COMPREHENSIVE METABOLIC PANEL
ALT: 16 U/L (ref 0–44)
AST: 22 U/L (ref 15–41)
Albumin: 3 g/dL — ABNORMAL LOW (ref 3.5–5.0)
Alkaline Phosphatase: 67 U/L (ref 38–126)
Anion gap: 10 (ref 5–15)
BUN: 53 mg/dL — ABNORMAL HIGH (ref 8–23)
CO2: 25 mmol/L (ref 22–32)
Calcium: 8.5 mg/dL — ABNORMAL LOW (ref 8.9–10.3)
Chloride: 101 mmol/L (ref 98–111)
Creatinine, Ser: 2.4 mg/dL — ABNORMAL HIGH (ref 0.61–1.24)
GFR, Estimated: 27 mL/min — ABNORMAL LOW (ref >60)
Glucose, Bld: 202 mg/dL — ABNORMAL HIGH (ref 70–99)
Potassium: 4.3 mmol/L (ref 3.5–5.1)
Sodium: 136 mmol/L (ref 135–145)
Total Bilirubin: 0.6 mg/dL (ref 0.3–1.2)
Total Protein: 6.5 g/dL (ref 6.5–8.1)

## 2022-08-26 LAB — CBC WITH DIFFERENTIAL/PLATELET
Abs Immature Granulocytes: 0.25 10*3/uL — ABNORMAL HIGH (ref 0.00–0.07)
Basophils Absolute: 0 10*3/uL (ref 0.0–0.1)
Basophils Relative: 0 %
Eosinophils Absolute: 0 10*3/uL (ref 0.0–0.5)
Eosinophils Relative: 0 %
HCT: 34.8 % — ABNORMAL LOW (ref 39.0–52.0)
Hemoglobin: 11.4 g/dL — ABNORMAL LOW (ref 13.0–17.0)
Immature Granulocytes: 2 %
Lymphocytes Relative: 15 %
Lymphs Abs: 1.9 10*3/uL (ref 0.7–4.0)
MCH: 30.5 pg (ref 26.0–34.0)
MCHC: 32.8 g/dL (ref 30.0–36.0)
MCV: 93 fL (ref 80.0–100.0)
Monocytes Absolute: 0.9 10*3/uL (ref 0.1–1.0)
Monocytes Relative: 7 %
Neutro Abs: 9.2 10*3/uL — ABNORMAL HIGH (ref 1.7–7.7)
Neutrophils Relative %: 76 %
Platelets: 214 10*3/uL (ref 150–400)
RBC: 3.74 MIL/uL — ABNORMAL LOW (ref 4.22–5.81)
RDW: 13.2 % (ref 11.5–15.5)
WBC: 12.2 10*3/uL — ABNORMAL HIGH (ref 4.0–10.5)
nRBC: 0 % (ref 0.0–0.2)

## 2022-08-26 LAB — BRAIN NATRIURETIC PEPTIDE: B Natriuretic Peptide: 359.2 pg/mL — ABNORMAL HIGH (ref 0.0–100.0)

## 2022-08-26 LAB — D-DIMER, QUANTITATIVE: D-Dimer, Quant: 0.66 ug/mL-FEU — ABNORMAL HIGH (ref 0.00–0.50)

## 2022-08-26 LAB — RESP PANEL BY RT-PCR (FLU A&B, COVID) ARPGX2
Influenza A by PCR: NEGATIVE
Influenza B by PCR: NEGATIVE
SARS Coronavirus 2 by RT PCR: NEGATIVE

## 2022-08-26 MED ORDER — IPRATROPIUM-ALBUTEROL 0.5-2.5 (3) MG/3ML IN SOLN
3.0000 mL | Freq: Once | RESPIRATORY_TRACT | Status: AC
Start: 2022-08-26 — End: 2022-08-26
  Administered 2022-08-26: 3 mL via RESPIRATORY_TRACT
  Filled 2022-08-26: qty 3

## 2022-08-26 MED ORDER — IPRATROPIUM-ALBUTEROL 0.5-2.5 (3) MG/3ML IN SOLN
3.0000 mL | Freq: Once | RESPIRATORY_TRACT | Status: AC
  Administered 2022-08-26: 3 mL via RESPIRATORY_TRACT
  Filled 2022-08-26: qty 3

## 2022-08-26 MED ORDER — AZITHROMYCIN 250 MG PO TABS: ORAL_TABLET | ORAL | 0 refills | Status: AC

## 2022-08-26 MED ORDER — ALBUTEROL SULFATE HFA 108 (90 BASE) MCG/ACT IN AERS: 2.0000 | INHALATION_SPRAY | Freq: Four times a day (QID) | RESPIRATORY_TRACT | 2 refills | Status: DC | PRN

## 2022-08-26 MED ORDER — SODIUM CHLORIDE 0.9 % IV BOLUS
500.0000 mL | Freq: Once | INTRAVENOUS | Status: AC
Start: 2022-08-26 — End: 2022-08-26
  Administered 2022-08-26: 500 mL via INTRAVENOUS

## 2022-08-26 NOTE — ED Notes (Signed)
Pt ambulated down hall and back. Denies chest pain or SOB. O2 sat 96% upon return to room. MD notified.

## 2022-08-26 NOTE — Discharge Instructions (Signed)
Please seek medical attention for any high fevers, chest pain, shortness of breath, change in behavior, persistent vomiting, bloody stool or any other new or concerning symptoms.  

## 2022-08-26 NOTE — ED Triage Notes (Addendum)
Pt brought in by [facility]. SOB, cough, neg COVID, used at home inhaler with no relief, no distressed noted. Per pt had discharge and redness from both eyes x1week, SOB started today, pt stated felt like it was coming on earlier today, fever yesterday 100.5. Hx of liver transplant and DM. During triage pt with bloody thick sputum noted after coughing.

## 2022-08-26 NOTE — ED Provider Notes (Signed)
[facility] Provider Note    Event Date/Time   First MD Initiated Contact with Patient 08/26/22 1503     (approximate)   History   Shortness of breath   HPI  Brian Pacheco is a 81 y.o. male  who presents to the emergency department today because of concern for shortness of breath. Over the past week the patient has been having cold like symptoms. These included cough congestion, low grade fevers, sore throat. Saw PCP who treated him empirically for COVID. He also had complaint of change in taste. Did test negative however for covid. Called 911 today because he felt short of breath. Had productive cough consisting of dark sputum. The patient denies any associated chest pain. Last fever yesterday of 100.5. EMS did give breathing treatment during transport and patient stated he felt like it helped.       Physical Exam   Triage Vital Signs: ED Triage Vitals  Enc Vitals Group     BP 08/26/22 1451 90/65     Pulse Rate 08/26/22 1451 96     Resp 08/26/22 1451 (!) 24     Temp 08/26/22 1451 98.7 F (37.1 C)     Temp Source 08/26/22 1451 Oral     SpO2 08/26/22 1451 98 %     Weight --      Height --      Head Circumference --      Peak Flow --      Pain Score 08/26/22 1452 0     Pain Loc --      Pain Edu? --      Excl. in [place]? --     Most recent vital signs: Vitals:   08/26/22 1451  BP: 90/65  Pulse: 96  Resp: (!) 24  Temp: 98.7 F (37.1 C)  SpO2: 98%   General: Awake, alert, oriented. CV:  Good peripheral perfusion. Regular rate and rhythm. Resp:  Slightly increased work of breathing. Diffuse expiratory wheezing. Abd:  No distention. Bruising to left lower quadrant.    ED Results / Procedures / Treatments   Labs (all labs ordered are listed, but only abnormal results are displayed) Labs Reviewed  COMPREHENSIVE METABOLIC PANEL - Abnormal; Notable for the following components:      Result Value   Glucose, Bld 202 (*)    BUN 53 (*)     Creatinine, Ser 2.40 (*)    Calcium 8.5 (*)    Albumin 3.0 (*)    GFR, Estimated 27 (*)    All other components within normal limits  BRAIN NATRIURETIC PEPTIDE - Abnormal; Notable for the following components:   B Natriuretic Peptide 359.2 (*)    All other components within normal limits  CBC WITH DIFFERENTIAL/PLATELET - Abnormal; Notable for the following components:   WBC 12.2 (*)    RBC 3.74 (*)    Hemoglobin 11.4 (*)    HCT 34.8 (*)    Neutro Abs 9.2 (*)    Abs Immature Granulocytes 0.25 (*)    All other components within normal limits  D-DIMER, QUANTITATIVE - Abnormal; Notable for the following components:   D-Dimer, Quant 0.66 (*)    All other components within normal limits  RESP PANEL BY RT-PCR (FLU A&B, COVID) [id number]     EKG  I, Nance Pear, attending physician, personally viewed and interpreted this EKG  EKG Time: 1448 Rate: 100 Rhythm: sinus tachycardia Axis: normal Intervals: qtc 456 QRS: narrow ST changes: no st elevation Impression:  abnormal ekg   RADIOLOGY I independently interpreted and visualized the CXR. My interpretation: No pneumonia Radiology interpretation:  IMPRESSION:  1. No acute intrathoracic process.      PROCEDURES:  Critical Care performed: No  Procedures   MEDICATIONS ORDERED IN ED: Medications - No data to display   IMPRESSION / MDM / [place] / ED COURSE  I reviewed the triage vital signs and the nursing notes.                              Differential diagnosis includes, but is not limited to, COPD, CHF, pneumonia, COVID, flu, PE.  Patient's presentation is most consistent with acute presentation with potential threat to life or bodily function.  The patient is on the cardiac monitor to evaluate for evidence of arrhythmia and/or significant heart rate changes.  Patient presents to the emergency department today because of concern for shortness of breath. On exam has diffuse expiratory wheezing.  Chest  x-ray here without pneumonia or pneumo thorax.  Patient did feel better after further breathing treatments here.  D-dimer was minimally elevated.  However when age-adjusted it is normal.  I did have a discussion with patient about possibility of blood clot.  Fortunately GFR precluded patient from obtaining CT angio at this time.  He felt comfortable deferring further work-up for blood clot I think this is reasonable given clinical improvement.  He was ambulated here in the emergency department said he felt better.  At this time do think likely patient suffering from COPD exacerbation/bronchitis.  Will discharge with albuterol inhaler and prescription for antibiotics.  Did discuss return precautions.   FINAL CLINICAL IMPRESSION(S) / ED DIAGNOSES   Final diagnoses:  COPD exacerbation ([place])    Note:  This document was prepared using Dragon voice recognition software and may include unintentional dictation errors.    Nance Pear, MD 08/26/22 [phone]

## 2022-09-07 ENCOUNTER — Ambulatory Visit: Payer: Medicare Other | Attending: Internal Medicine | Admitting: Cardiology

## 2022-09-07 ENCOUNTER — Encounter: Payer: Self-pay | Admitting: Cardiology

## 2022-09-07 VITALS — BP 120/70 | HR 79 | Ht 67.0 in | Wt 211.5 lb

## 2022-09-07 DIAGNOSIS — I10 Essential (primary) hypertension: Secondary | ICD-10-CM

## 2022-09-07 DIAGNOSIS — I2510 Atherosclerotic heart disease of native coronary artery without angina pectoris: Secondary | ICD-10-CM | POA: Diagnosis not present

## 2022-09-07 DIAGNOSIS — N183 Chronic kidney disease, stage 3 unspecified: Secondary | ICD-10-CM

## 2022-09-07 DIAGNOSIS — I5042 Chronic combined systolic (congestive) and diastolic (congestive) heart failure: Secondary | ICD-10-CM | POA: Diagnosis not present

## 2022-09-07 DIAGNOSIS — Z944 Liver transplant status: Secondary | ICD-10-CM

## 2022-09-07 DIAGNOSIS — I2584 Coronary atherosclerosis due to calcified coronary lesion: Secondary | ICD-10-CM

## 2022-09-07 DIAGNOSIS — I739 Peripheral vascular disease, unspecified: Secondary | ICD-10-CM

## 2022-09-07 DIAGNOSIS — E782 Mixed hyperlipidemia: Secondary | ICD-10-CM

## 2022-09-07 NOTE — Patient Instructions (Signed)
  Call and schedule an appointment with [facility]  [phone]  Medication Instructions:   Your physician recommends that you continue on your current medications as directed. Please refer to the Current Medication list given to you today.   *If you need a refill on your cardiac medications before your next appointment, please call your pharmacy*   Testing/Procedures:  Your physician has requested that you have a limited echocardiogram. Echocardiography is a painless test that uses sound waves to create images of your heart. It provides your doctor with information about the size and shape of your heart and how well your heart's chambers and valves are working. This procedure takes approximately one hour. There are no restrictions for this procedure. Please do NOT wear cologne, perfume, aftershave, or lotions (deodorant is allowed). Please arrive 15 minutes prior to your appointment time.    Follow-Up: At [facility], you and your health needs are our priority.  As part of our continuing mission to provide you with exceptional heart care, we have created designated Provider Care Teams.  These Care Teams include your primary Cardiologist (physician) and Advanced Practice Providers (APPs -  Physician Assistants and Nurse Practitioners) who all work together to provide you with the care you need, when you need it.  We recommend signing up for the patient portal called "MyChart".  Sign up information is provided on this After Visit Summary.  MyChart is used to connect with patients for Virtual Visits (Telemedicine).  Patients are able to view lab/test results, encounter notes, upcoming appointments, etc.  Non-urgent messages can be sent to your provider as well.   To learn more about what you can do with MyChart, go to [email or web address].    Your next appointment:   Follow up after testing   The format for your next appointment:   In Person  Provider:   You may see  Nelva Bush, MD or one of the following Advanced Practice Providers on your designated Care Team:   [name], NP Christell Faith, PA-C Cadence Kathlen Mody, PA-C Gerrie Nordmann, NP    Other Instructions   Important Information About Sugar

## 2022-09-07 NOTE — Progress Notes (Signed)
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[place])    liver replacement per pt report   Liver transplant status ([place])    Peripheral vascular disease ([place])    Past Surgical History:  Procedure Laterality Date   CAROTID ENDARTERECTOMY     CHOLECYSTECTOMY     HERNIA REPAIR     umbilical incarcerated   IR ANGIOGRAM EXTREMITY BILATERAL Bilateral    patient states he has stents in both legs   LIVER TRANSPLANT  2003   LOWER EXTREMITY ANGIOGRAPHY Left 10/18/2018   Procedure: LOWER EXTREMITY ANGIOGRAPHY;  Surgeon: Brian Huxley, MD;  Location: [facility];  Service: Cardiovascular;  Laterality: Left;   TONSILLECTOMY     VASECTOMY  1977    Allergies  Allergies  Allergen Reactions   Hydralazine Rash and Shortness Of Breath   Fenofibrate Other (See Comments)   Pravastatin     Other reaction(s): Other (See Comments)   Benazepril     Other reaction(s): Other (See Comments)   Sitagliptin Other (See Comments) and Rash     blisters Other reaction(s): Other (see comments) Other reaction(s): Other (See Comments) blisters blisters blisters Other reaction(s): Other (See Comments) blisters blisters     History of Present Illness    Brian Pacheco is a 81 year old male with history of coronary artery calcification, chronic combined systolic and diastolic CHF with NICM, CKD stage III, liver transplant 2003 for NAFLD, PAD status post bilateral iliac stenting in 2017, carotid artery disease status post left-sided CEA in 2018, DM 2, hypertension, hyperlipidemia, COPD, and tobacco use.  He presented to [facility] and was admitted from 02/05/2013 for dyspnea with volume overload.  CTA of the chest was negative for PE, showed bilateral pulmonary edema Troponin peaked at 143.  BNP was 236.  Echo showed EF 40-45%, global hypokinesis, grade 3 diastolic dysfunction, mild mitral regurgitation, mild tricuspid stenosis, aortic valve sclerosis without evidence of stenosis.  However upon CHG reviewing the images EF was found to be closer to 50-55%.  R/LHC was deferred given acute on chronic stage III kidney disease.  He subsequently underwent Lexiscan MPI during the admission which showed no significant ischemia with an EF of 29% which showed to be depressed secondary to GI uptake artifact.  Overall this was a moderate risk in the setting of depressed EF.  He was diuresed with symptomatic improvement.  Medications were optimized as able.  He was evaluated in the [facility] emergency department 08/26/2022 for shortness of breath.  Over the past week eval he  was having cold-like symptoms this included cough, congestion, low-grade fevers, and sore throat.  Saw his PCP who treated him empirically for COVID.  He did test negative however for COVID.  Called 911 because he felt short of breath productive cough consisting of dark sputum.  He denies any associated chest pain.  Vital signs on arrival for blood pressure 90/65, pulse 96, respirations 24,  temperature of 98.7.  BUN of 53, serum creatinine of 2.40, estimated GFR 27, BNP 359.2, WBCs 12.2, hemoglobin 11.4, hematocrit 34.8, D-dimer 0.66.  GFR precluded patient reported CTA chest at that time.  Difficulty will determine further work-up for blood clot which is reasonable given clinical improvement.  He was treated with an albuterol inhaler prescription for antibiotics and was discharged home.  He returns clinic today stating that he has been doing fairly well.  He did state that he was in the emergency department for fluid overload.  He was unsure of what had happened.  He states that he is limited his sodium intake and has been taking his furosemide as instructed but he all of a sudden became short of breath and went up in the emergency department for evaluation.  He has had 2 visits to the emergency department with 1 hospitalizations this year is second visit to the emergency department he was treated in the emergency department and subsequently discharged.  Today he denies any chest pain, shortness of breath, dyspnea on exertion, peripheral edema.    Home Medications    Current Outpatient Medications  Medication Sig Dispense Refill   albuterol (PROVENTIL HFA;VENTOLIN HFA) 108 (90 Base) MCG/ACT inhaler Inhale 2 puffs into the lungs every 4 (four) hours as needed.      amLODipine (NORVASC) 5 MG tablet Take 1 tablet (5 mg total) by mouth daily. 30 tablet 0   B Complex-C (B-COMPLEX WITH VITAMIN C) tablet Take 1 tablet by mouth. Three times per week     bisoprolol (ZEBETA) 5 MG tablet Take 1 tablet (5 mg total) by mouth daily. 30 tablet 0   Cholecalciferol (VITAMIN D3) 50 MCG (2000 UT) TABS Take by mouth every other day.     clopidogrel (PLAVIX) 75 MG tablet Take 75 mg by mouth daily.      Dupilumab 300 MG/2ML SOPN Inject 300 mg into the skin every 14 (fourteen) days.     ezetimibe (ZETIA) 10 MG tablet Take 1 tablet (10 mg total) by mouth daily. 30 tablet 0   fenofibrate 160 MG tablet Take 160  mg by mouth daily.      furosemide (LASIX) 20 MG tablet Take 1 tablet (20 mg total) by mouth daily. 30 tablet 0   glipiZIDE (GLUCOTROL) 5 MG tablet Take 5 mg by mouth 2 (two) times daily before a meal.      Melatonin 5 MG CHEW Chew 5 mg by mouth at bedtime as needed.     metFORMIN (GLUCOPHAGE) 500 MG tablet Take 500 mg by mouth 2 (two) times daily with a meal.      Multiple Vitamins-Minerals (MENS MULTIVITAMIN) CHEW Chew by mouth. Takes 2-3 days per week     mycophenolate (CELLCEPT) 250 MG capsule Take 250 mg by mouth 2 (two) times daily.     omega-3 acid ethyl esters (LOVAZA) 1 g capsule Take 1 g by mouth daily.     omeprazole (PRILOSEC) 20 MG capsule Take 20 mg by mouth daily.      rosuvastatin (CRESTOR) 5 MG tablet Take 2.5 mg by mouth at bedtime.  sildenafil (VIAGRA) 100 MG tablet Take 100 mg by mouth daily.     tamsulosin (FLOMAX) 0.4 MG CAPS capsule Take 0.4 mg by mouth daily.      venlafaxine (EFFEXOR) 75 MG tablet Take 75 mg by mouth 3 (three) times daily.      vitamin E 180 MG (400 UNITS) capsule Take 400 Units by mouth. Takes 3 days per week     Fluticasone-Umeclidin-Vilant (TRELEGY ELLIPTA) 100-62.5-25 MCG/INH AEPB Inhale 1 puff into the lungs daily. (Patient not taking: Reported on 09/07/2022)     No current facility-administered medications for this visit.     Family History    Family History  Problem Relation Age of Onset   Heart disease Father    He indicated that his mother is deceased. He indicated that his father is deceased.  Social History    Social History   Socioeconomic History   Marital status: Married    Spouse name: Not on file   Number of children: Not on file   Years of education: Not on file   Highest education level: Not on file  Occupational History   Not on file  Tobacco Use   Smoking status: Former    Packs/day: 0.25    Years: 60.00    Total pack years: 15.00    Types: Cigarettes    Quit date: 10/15/2018    Years since quitting: 3.8    Smokeless tobacco: Never   Tobacco comments:    currently going to smoking cessation clinic at [facility], smoked 1 ppd until recently, down to 7 a day  Substance and Sexual Activity   Alcohol use: Not Currently    Comment: history of alcohol abuse quit 2003   Drug use: Never   Sexual activity: Not on file  Other Topics Concern   Not on file  Social History Narrative   Not on file   Social Determinants of Health   Financial Resource Strain: Not on file  Food Insecurity: Not on file  Transportation Needs: Not on file  Physical Activity: Not on file  Stress: Not on file  Social Connections: Not on file  Intimate Partner Violence: Not on file     Review of Systems    General:  No chills, fever, night sweats or weight changes.  Cardiovascular:  No chest pain, dyspnea on exertion, endorses occasional edema, orthopnea, palpitations, paroxysmal nocturnal dyspnea. Dermatological: No rash, lesions/masses Respiratory: No cough, endorses occasional dyspnea Urologic: No hematuria, dysuria Abdominal:   No nausea, vomiting, diarrhea, bright red blood per rectum, melena, or hematemesis Neurologic:  No visual changes, wkns, changes in mental status. All other systems reviewed and are otherwise negative except as noted above.   Physical Exam    VS:  BP 120/70 (BP Location: Left Arm, Patient Position: Sitting, Cuff Size: Normal)   Pulse 79   Ht '5\' 7"'$  (1.702 m)   Wt 211 lb 8 oz (95.9 kg)   SpO2 97%   BMI 33.13 kg/m  , BMI Body mass index is 33.13 kg/m.     GEN: Well nourished, well developed, in no acute distress. HEENT: normal. Neck: Supple, no JVD, carotid bruits, or masses. Cardiac: RRR, no murmurs, rubs, or gallops. No clubbing, cyanosis, trace pretibial edema.  Radials/DP/PT 2+ and equal bilaterally.  Respiratory:  Respirations regular and unlabored, clear to auscultation bilaterally. GI: Soft, nontender, nondistended, BS + x 4. MS: no deformity or atrophy. Skin: warm and dry, no  rash. Neuro:  Strength and sensation are intact.  Psych: Normal affect.  Accessory Clinical Findings    ECG personally reviewed by me today-no new tracings were completed today  Lab Results  Component Value Date   WBC 12.2 (H) 08/26/2022   HGB 11.4 (L) 08/26/2022   HCT 34.8 (L) 08/26/2022   MCV 93.0 08/26/2022   PLT 214 08/26/2022   Lab Results  Component Value Date   CREATININE 2.40 (H) 08/26/2022   BUN 53 (H) 08/26/2022   NA 136 08/26/2022   K 4.3 08/26/2022   CL 101 08/26/2022   CO2 25 08/26/2022   Lab Results  Component Value Date   ALT 16 08/26/2022   AST 22 08/26/2022   ALKPHOS 67 08/26/2022   BILITOT 0.6 08/26/2022   Lab Results  Component Value Date   CHOL 219 (H) 02/06/2022   HDL 40 (L) 02/06/2022   LDLCALC 152 (H) 02/06/2022   TRIG 134 02/06/2022   CHOLHDL 5.5 02/06/2022    Lab Results  Component Value Date   HGBA1C 7.8 (H) 02/06/2022    Assessment & Plan   1.  Chronic combined systolic and diastolic CHF with NICM.  He appears euvolemic on exam today compensated NYHA class II symptoms.  He has been scheduled for repeat limited echocardiogram with a subsequent emergency department visits with exacerbation of heart failure.  Echo during her previous admission was 40-45% however upon primary cardiologist review with appears to be low normal closer to 50-55%.  He is continued on bisoprolol and furosemide.  Continued CHF education is needed as well.  He also missed his last appointment with heart failure clinic he will call him with his appointment rescheduled.  2.  Coronary artery calcification with demand ischemia with no symptoms concerning for angina.  Lexiscan MPI showed no evidence of ischemia.  Continue current medical therapy and risk factor modification.  No indication for further ischemic testing required at this time.  3.  Hypertension with blood pressure 120/70 today which has been stable.  He remains on amlodipine, bisoprolol and furosemide today.   He has been encouraged to continue to monitor his pressures at home as well.  4.  Hyperlipidemia with most recent LDL of 152 01/2022.  He remains on low-dose rosuvastatin and ezetimibe  5.  PAD with no symptoms of claudication today.  ABIs from 02/2022 improved when compared to prior study.  He continues to be followed by vascular surgery  6.  Carotid artery disease status post left-sided carotid endarterectomy.  He follows with vascular surgery.  He remains on clopidogrel.  7.  He does have a history of a liver transplant and is continue to be followed by the [facility] liver clinic  8.  CKD stage III with his last serum creatinine of 2.40 on 08/26/2022 during his visit to the emergency department.  Serum creatinine baseline of 2.1-2.4.  9.  Disposition patient return to clinic to see MD/APP after echocardiogram is completed or sooner if needed  Keslyn Teater, NP 09/07/2022, 3:42 PM

## 2022-09-15 ENCOUNTER — Ambulatory Visit: Payer: Medicare Other | Admitting: Internal Medicine

## 2022-10-10 IMAGING — CT CT ANGIO CHEST
2 of 6 series · 17 of 46 positions shown · IV contrast (agent unspecified)
Comparison: None.

CLINICAL DATA: Shortness of breath.

EXAM:
CT ANGIOGRAPHY CHEST WITH CONTRAST
TECHNIQUE: Multidetector CT imaging of the chest was performed using the
standard protocol during bolus administration of intravenous
contrast. Multiplanar CT image reconstructions and MIPs were
obtained to evaluate the vascular anatomy.

[Series 6: thins · axial · 0.81mm/px · z∈[-1396,-1123]mm · 14 of 428 slices shown]
[im 19/428  lung]
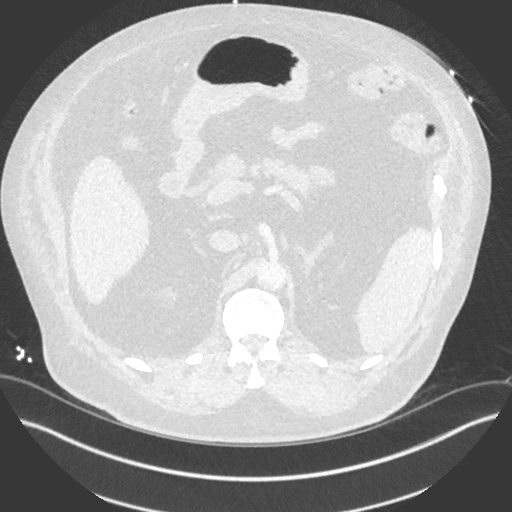
[im 56/428  soft-tissue]
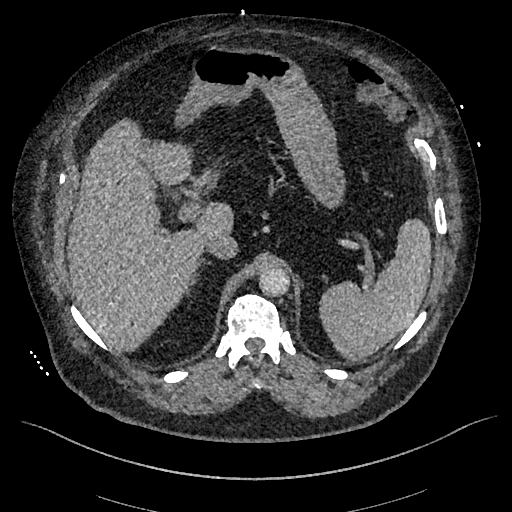
[im 75/428  lung]
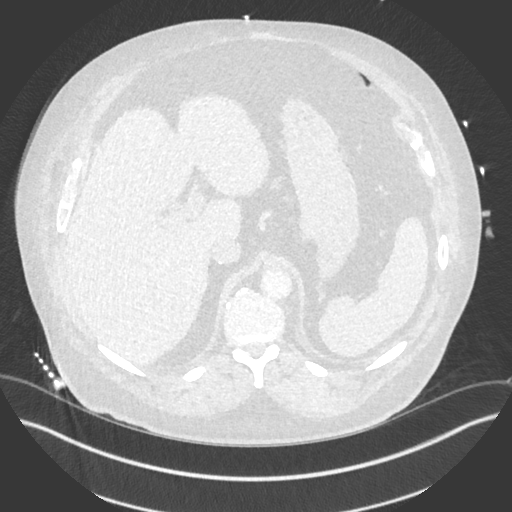
[im 112/428  soft-tissue]
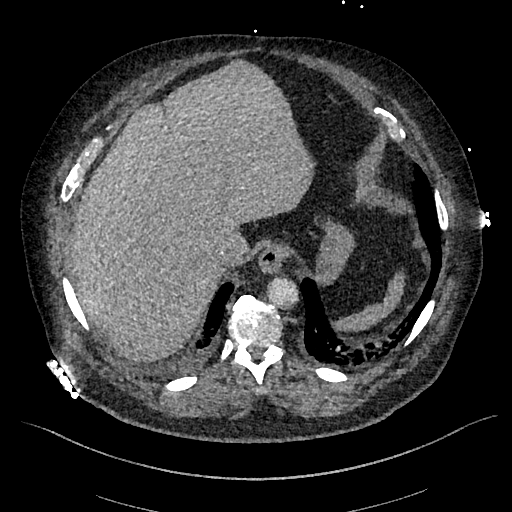
[im 149/428  lung]
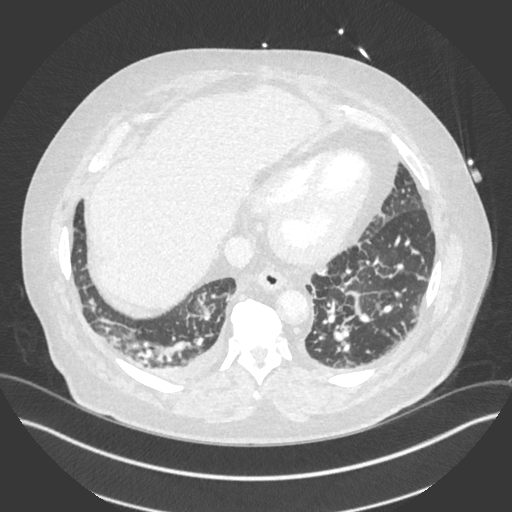
[im 168/428  soft-tissue]
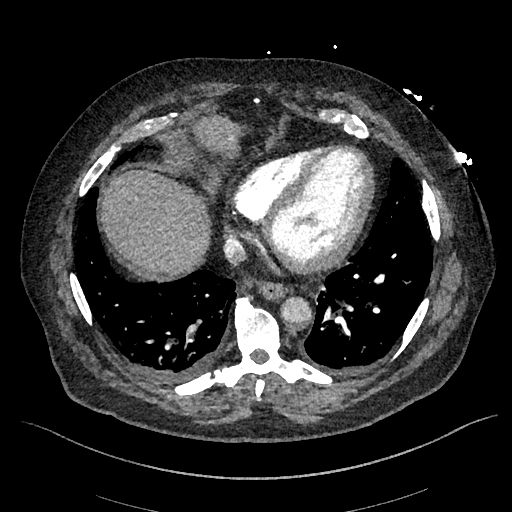
[im 205/428  lung]
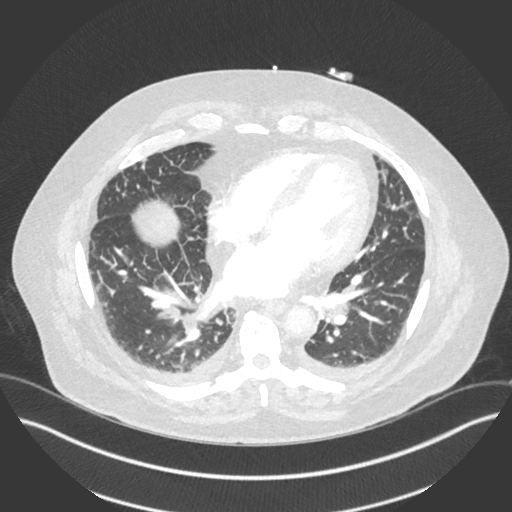
[im 223/428  soft-tissue]
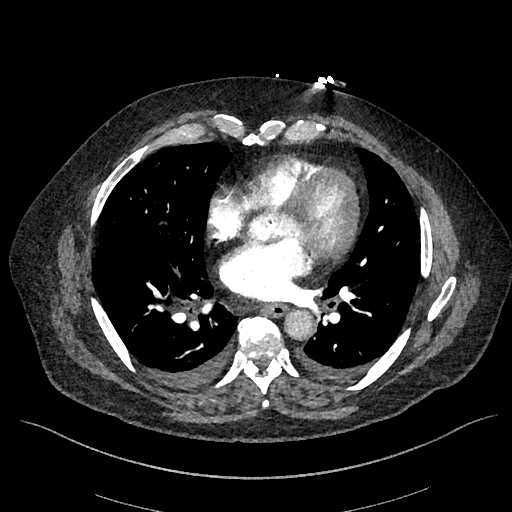
[im 260/428  lung]
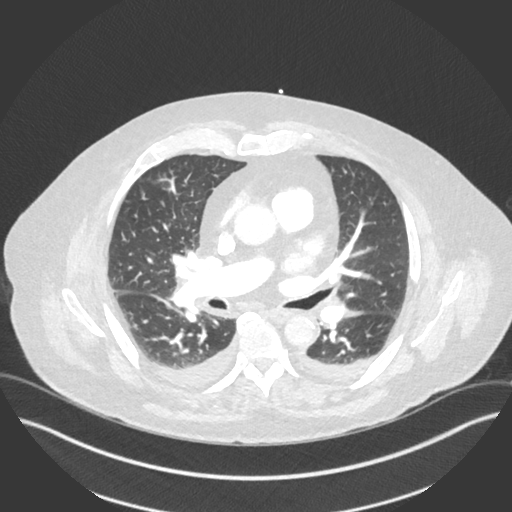
[im 279/428  soft-tissue]
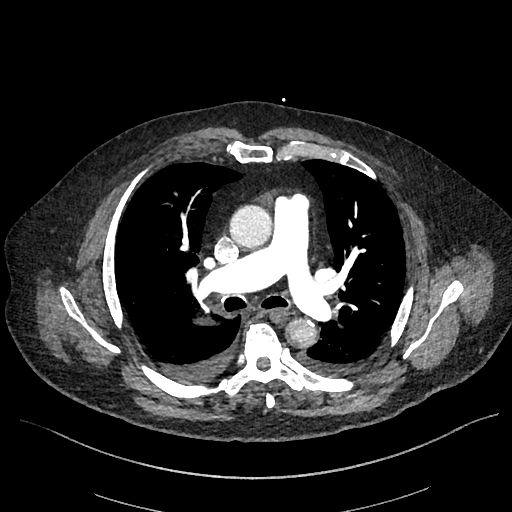
[im 316/428  lung]
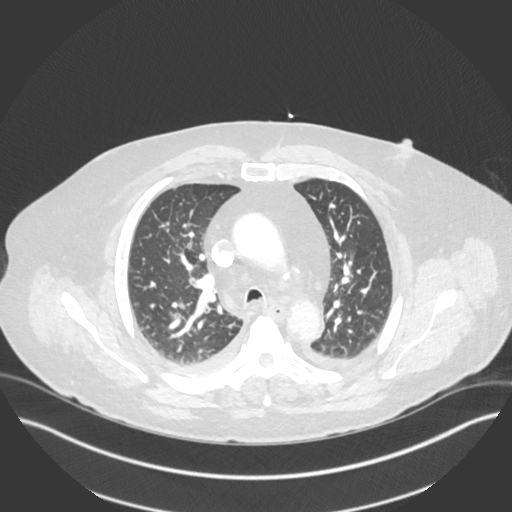
[im 353/428  soft-tissue]
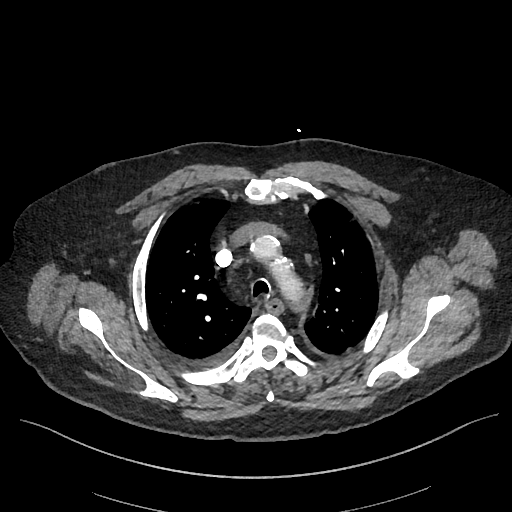
[im 372/428  lung]
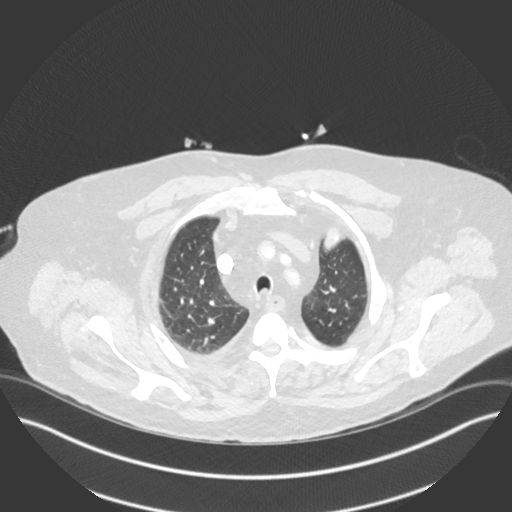
[im 409/428  soft-tissue]
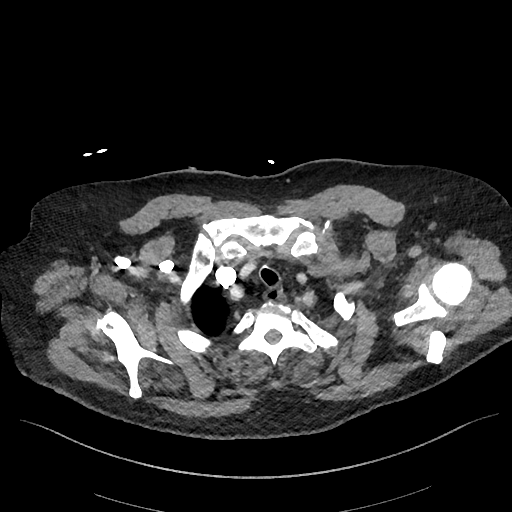

[Series 7: cor · coronal · 0.63mm/px · 3 of 181 slices shown]
[im 46/181  soft-tissue]
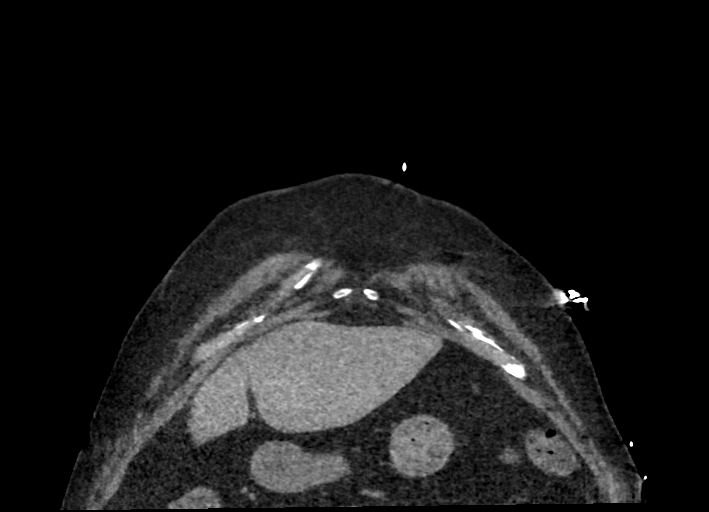
[im 91/181  soft-tissue]
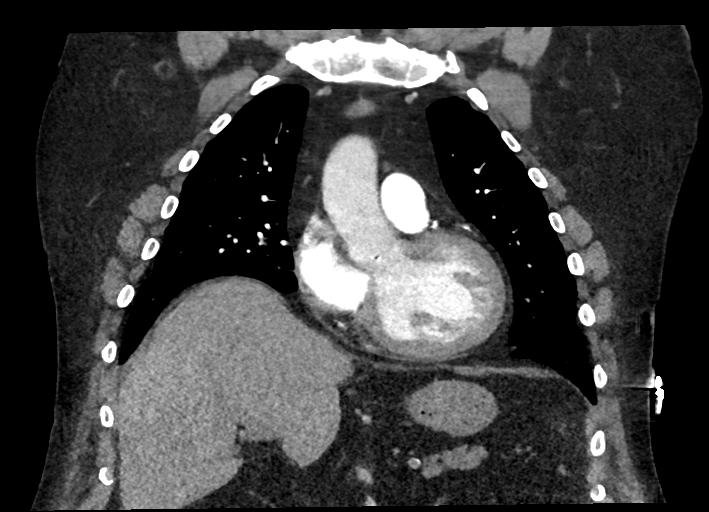
[im 136/181  soft-tissue]
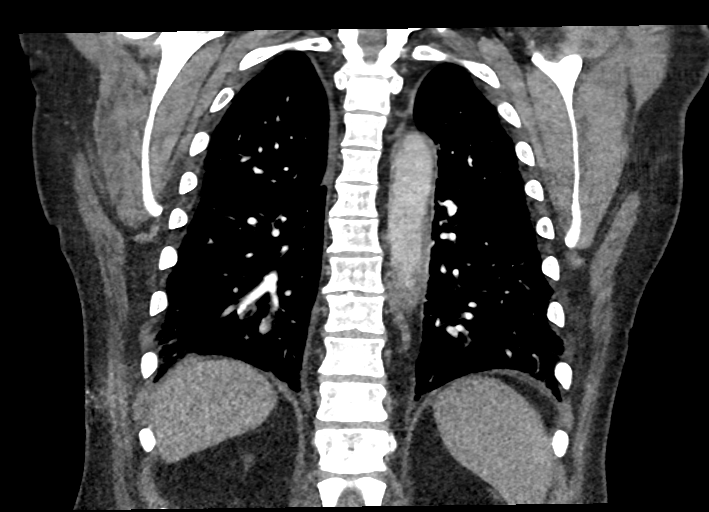

[17 of 46 positions shown; findings below may reference images not displayed]

RADIATION DOSE REDUCTION: This exam was performed according to the
departmental dose-optimization program which includes automated
exposure control, adjustment of the mA and/or kV according to
patient size and/or use of iterative reconstruction technique.

CONTRAST:  50mL OMNIPAQUE IOHEXOL 350 MG/ML SOLN
FINDINGS: Cardiovascular: There is moderate severity calcification and
atherosclerosis of the thoracic aorta, without evidence of aortic
aneurysm. Satisfactory opacification of the pulmonary arteries to
the segmental level. No evidence of pulmonary embolism. Normal heart
size with moderate severity coronary artery calcification. No
pericardial effusion.

Mediastinum/Nodes: Mild AP window, paratracheal and right hilar
lymphadenopathy is noted. Thyroid gland, trachea, and esophagus
demonstrate no significant findings.

Lungs/Pleura: There is mild, bilateral diffuse interstitial
thickening.

A 6 mm noncalcified anterior left upper lobe lung nodule is seen
(axial CT image 31, CT series 5).

A 4 mm anterior right upper lobe noncalcified lung nodule is also
noted (axial CT image 49, CT series 5).

Very mild, slightly nodular appearing infiltrate is seen within the
right middle lobe.

Mild bibasilar atelectasis and/or infiltrate is also noted with
associated thickening of the adjacent bronchioles.

There are small bilateral pleural effusions.

No pneumothorax is identified.

Upper Abdomen: Surgical clips are seen within the gallbladder fossa.

Musculoskeletal: No chest wall abnormality. No acute or significant
osseous findings.

Review of the MIP images confirms the above findings.
IMPRESSION: 1. No evidence of pulmonary embolism.
2. Mild interstitial edema with mild bibasilar atelectasis and/or
infiltrate.
3. Very mild, slightly nodular appearing right middle lobe
infiltrate which may represent sequelae associated with an atypical
infectious process. Follow-up to resolution is recommended.
4. Small bilateral pleural effusions.
5. Mild AP window, paratracheal and right hilar lymphadenopathy,
likely reactive.
6. Moderate severity coronary artery disease.
7. 6 mm anterior left upper lobe and 4 mm right upper lobe
noncalcified lung nodules. Non-contrast chest CT at 3-6 months is
recommended. If the nodules are stable at time of repeat CT, then
future CT at 18-24 months (from today's scan) is considered optional
for low-risk patients, but is recommended for high-risk patients.
This recommendation follows the consensus statement: Guidelines for
Management of Incidental Pulmonary Nodules Detected on CT Images:

Aortic Atherosclerosis (VITUZ-V0S.S).

## 2022-10-10 IMAGING — CR DG CHEST 2V
1 series · 2 of 2 positions shown · non-contrast
Comparison: 04/03/2019

CLINICAL DATA: Shortness of breath

EXAM:
CHEST - 2 VIEW

[Series 1: dg chest 2 view · 0.14mm/px · 2 of 2 slices shown]
[im 1/2]
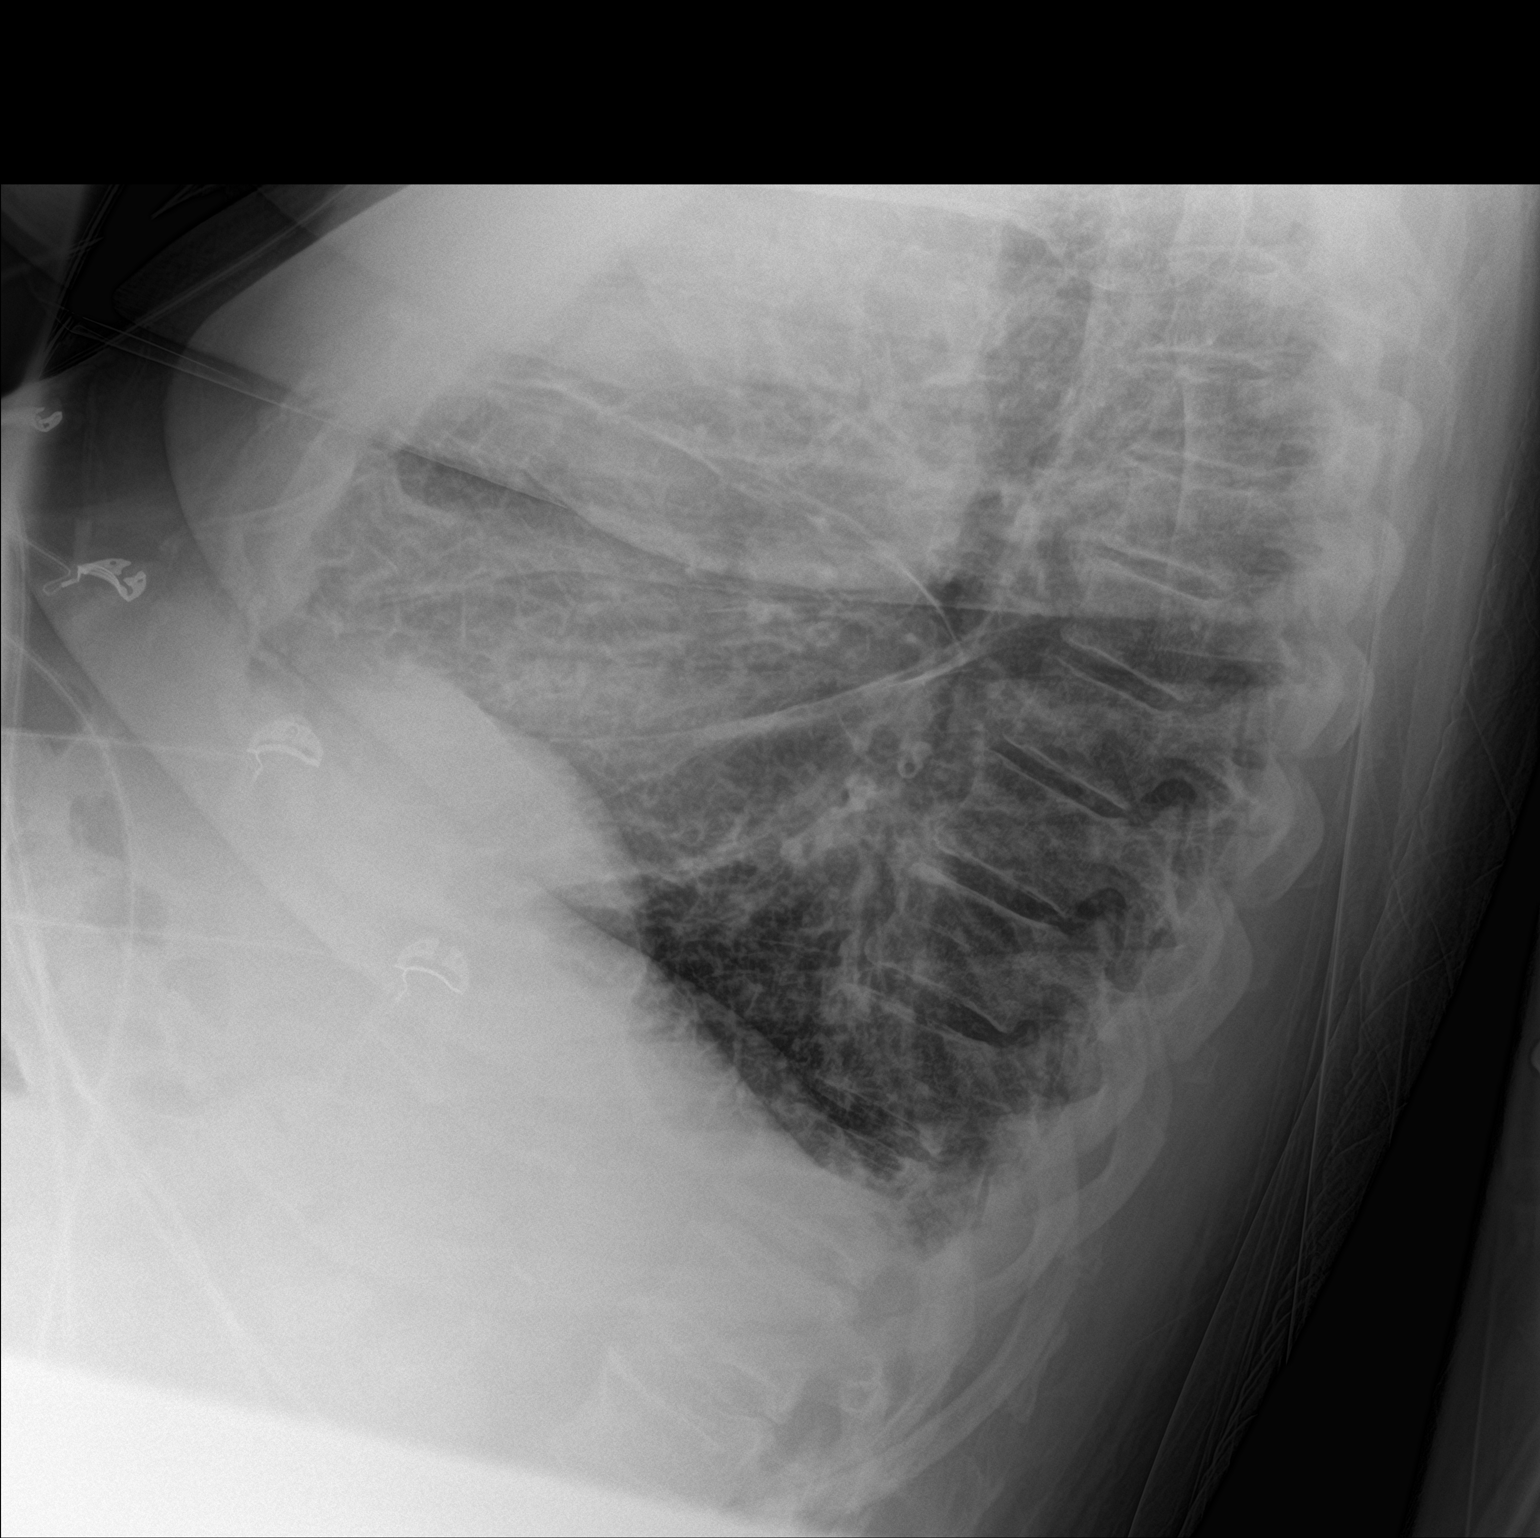
[im 2/2]
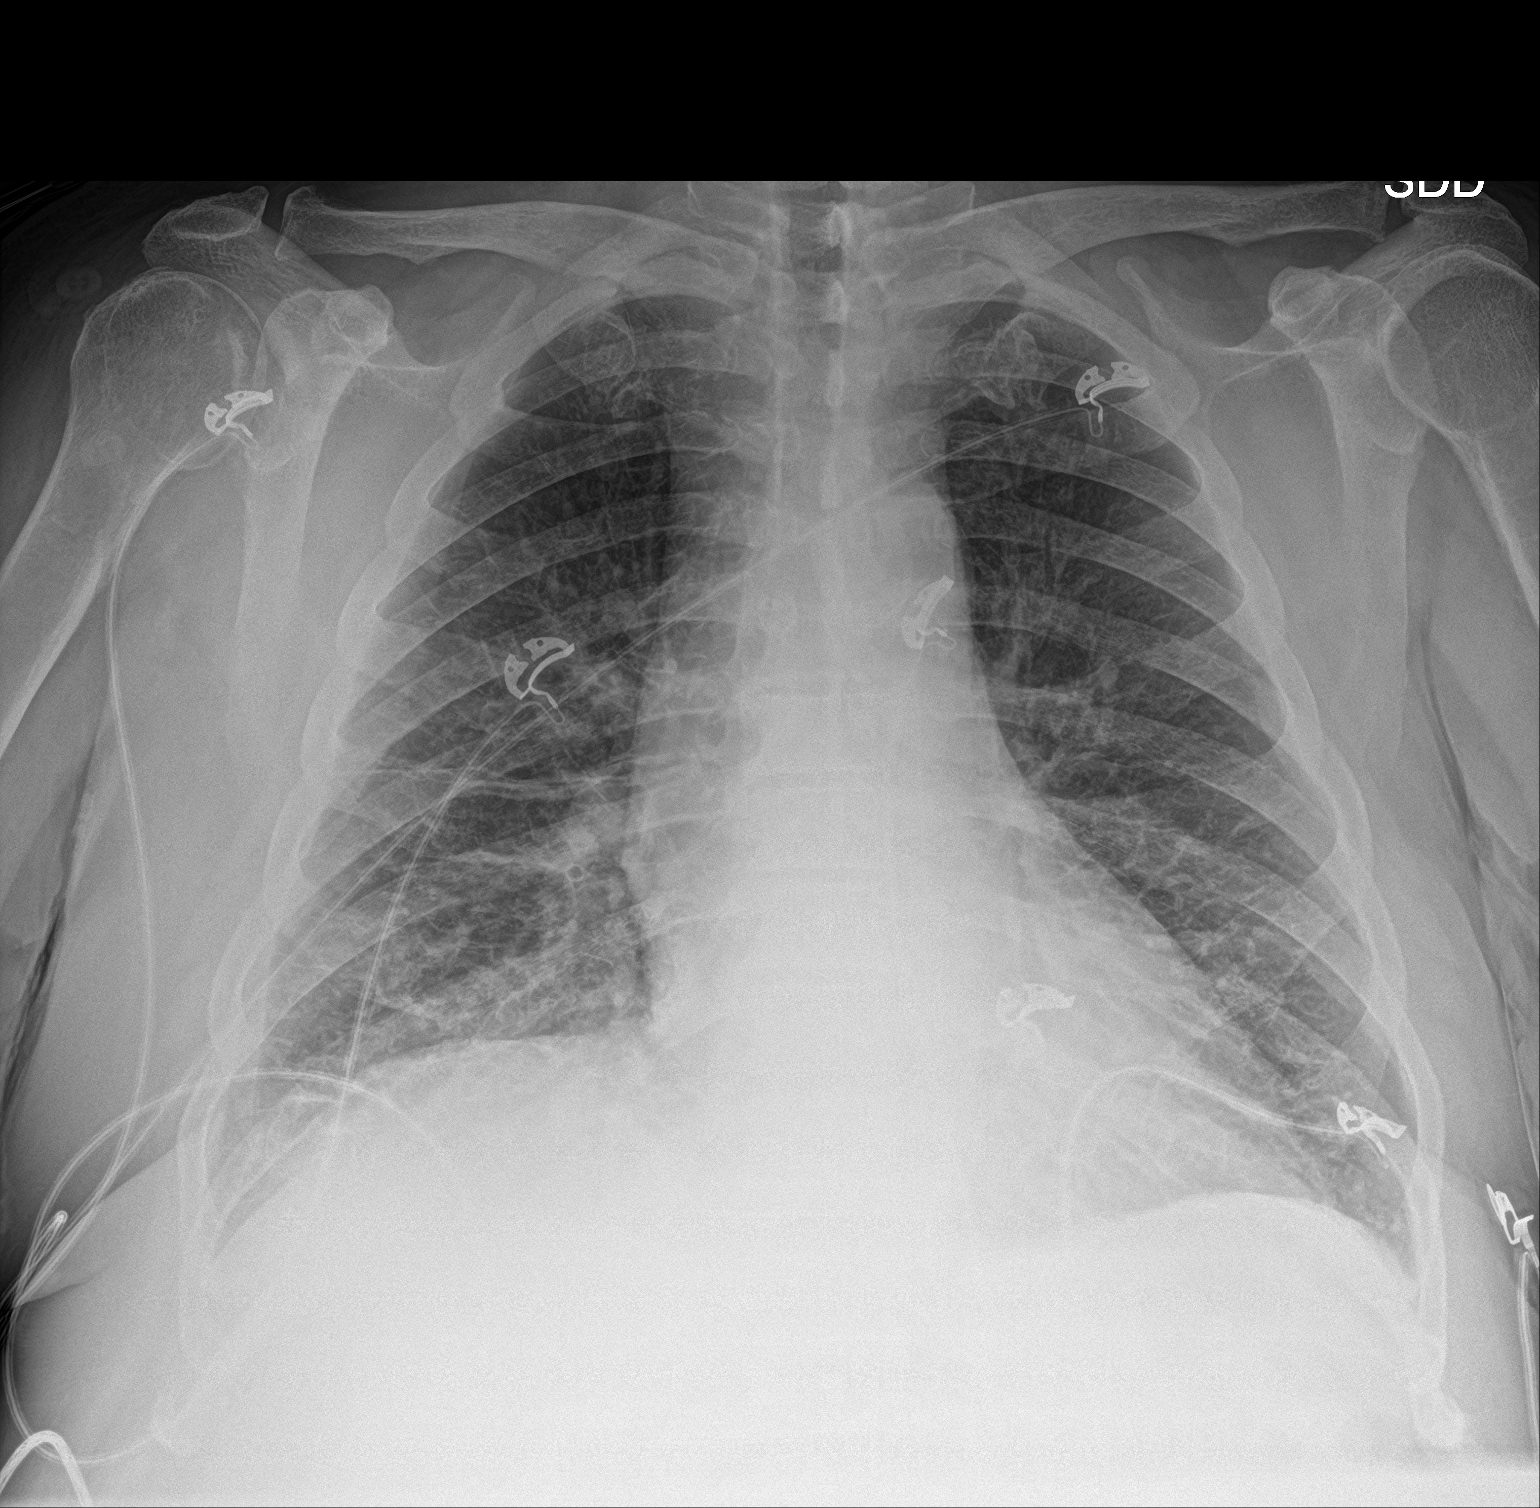

[2 of 2 positions shown; findings below may reference images not displayed]

FINDINGS: Trace pleural effusions are suspected. Increased interstitial
opacities. No consolidation or pneumothorax. Stable normal
cardiomediastinal silhouette.
IMPRESSION: 1. Mild diffuse increased interstitial opacities suggestive of mild
interstitial edema or inflammatory process. Probable tiny bilateral
effusions.

## 2022-10-11 IMAGING — US US EXTREM LOW VENOUS
1 series · 14 of 24 positions shown · non-contrast
Comparison: None.

CLINICAL DATA: 80-year-old male with history of lower extremity
edema.

EXAM:
BILATERAL LOWER EXTREMITY VENOUS DOPPLER ULTRASOUND
TECHNIQUE: Gray-scale sonography with compression, as well as color and duplex
ultrasound, were performed to evaluate the deep venous system(s)
from the level of the common femoral vein through the popliteal and
proximal calf veins.

[Series 1: us venous img lower bilat (dvt) · portal-venous · 14 of 62 slices shown]
[im 1/62]
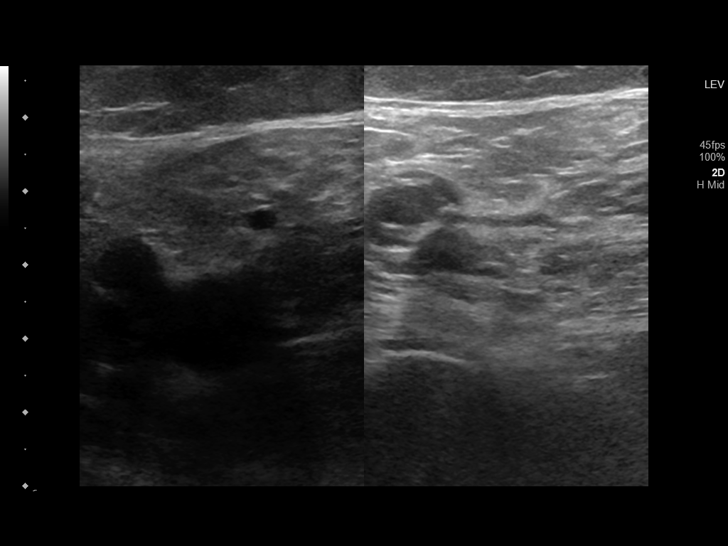
[im 6/62]
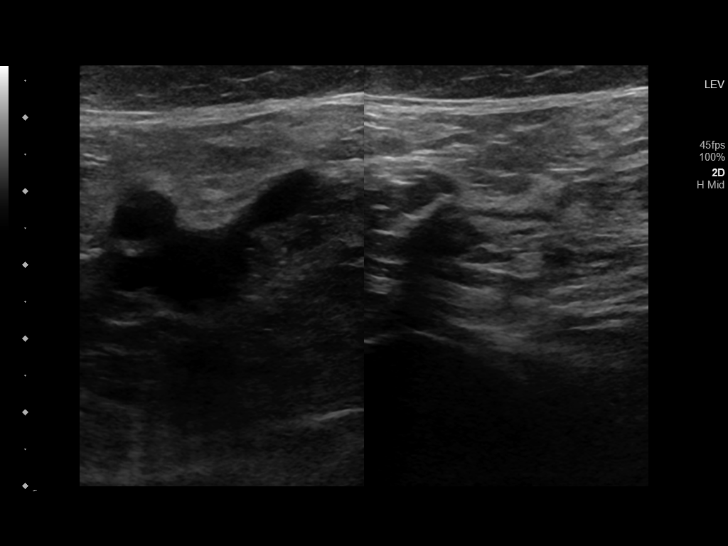
[im 11/62]
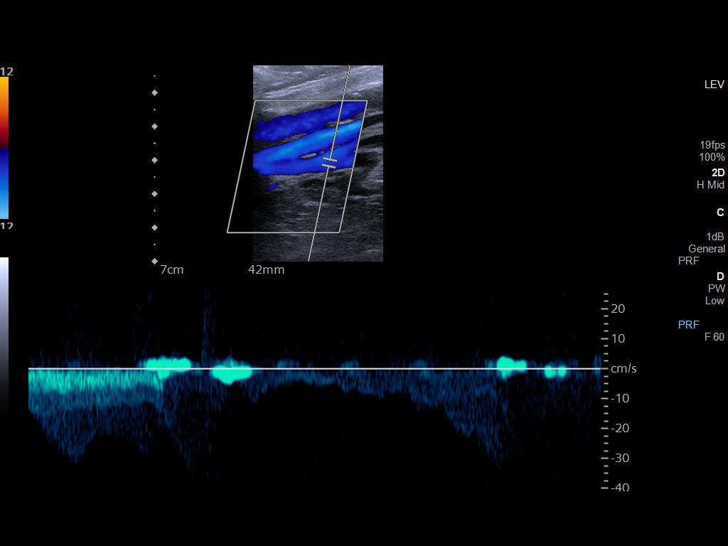
[im 16/62]
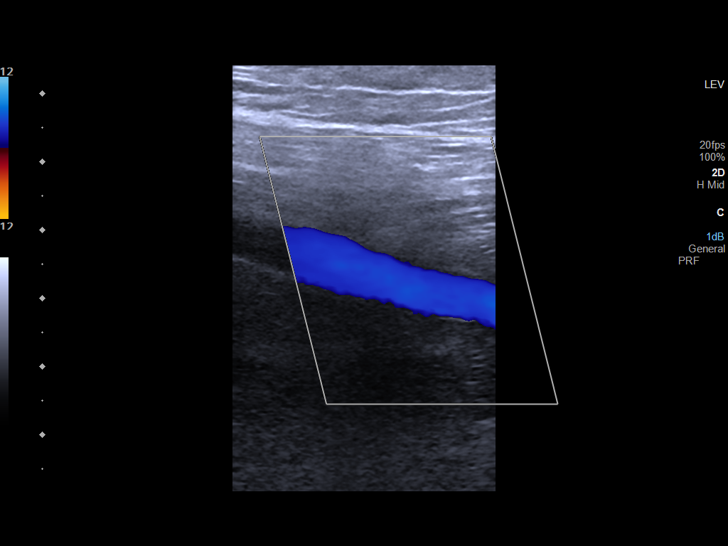
[im 19/62]
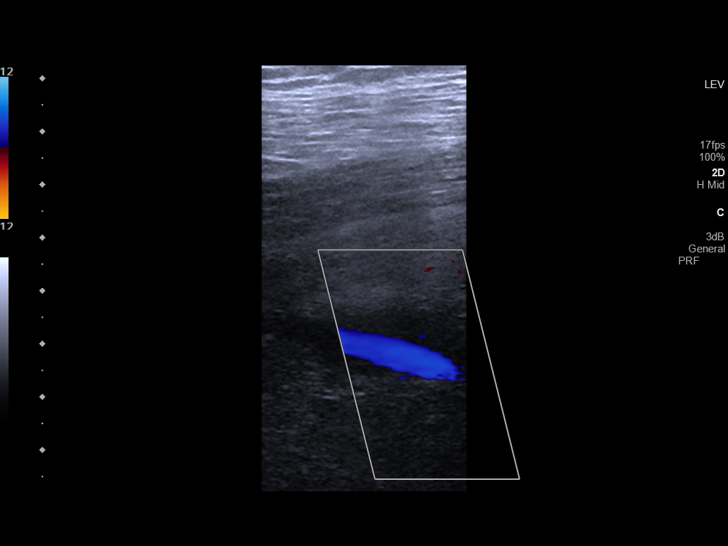
[im 24/62]
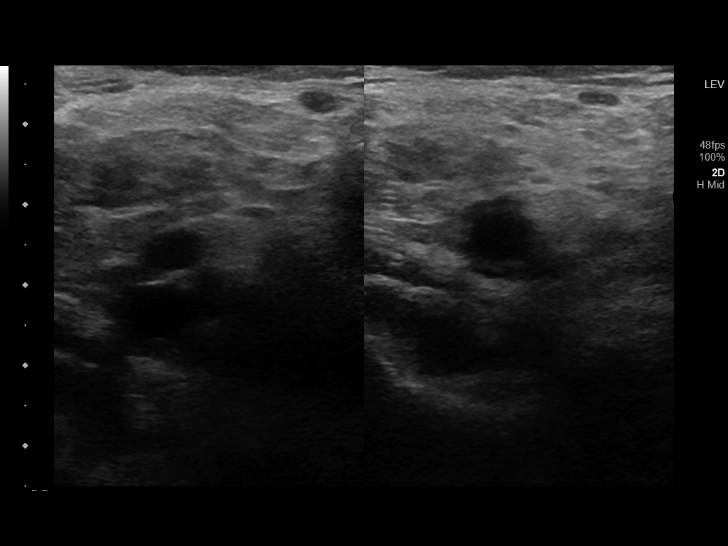
[im 30/62]
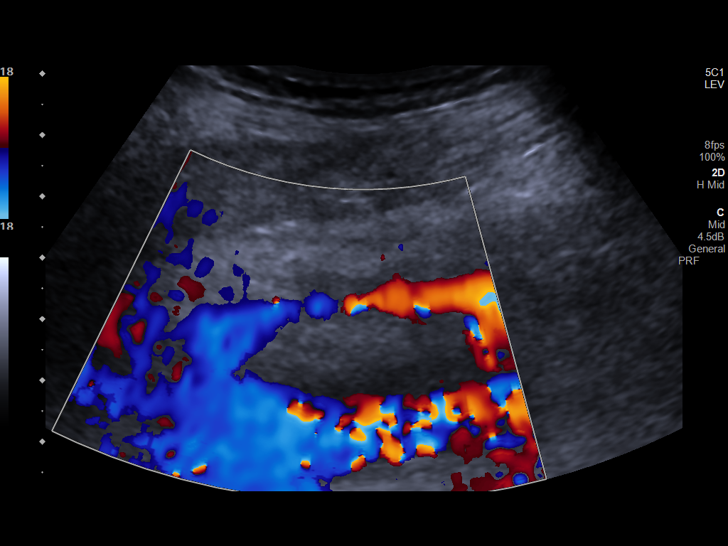
[im 32/62]
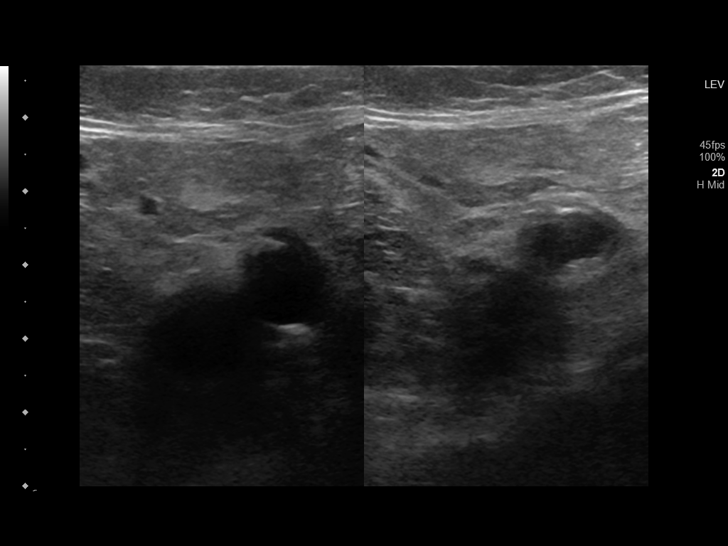
[im 38/62]
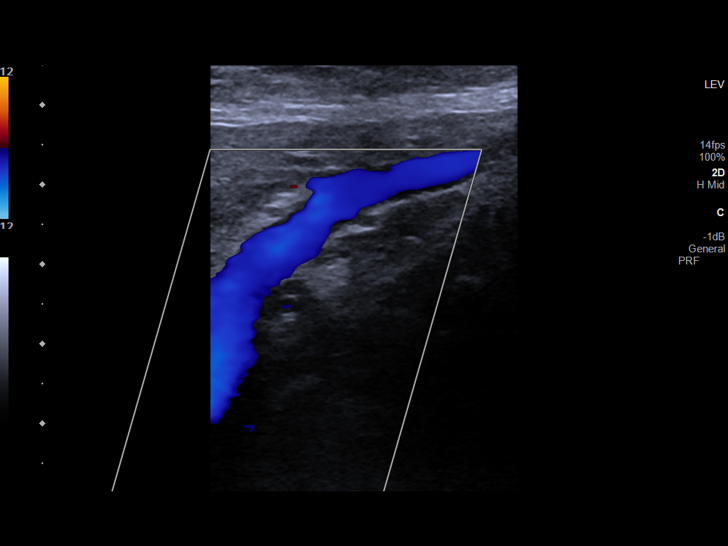
[im 43/62]
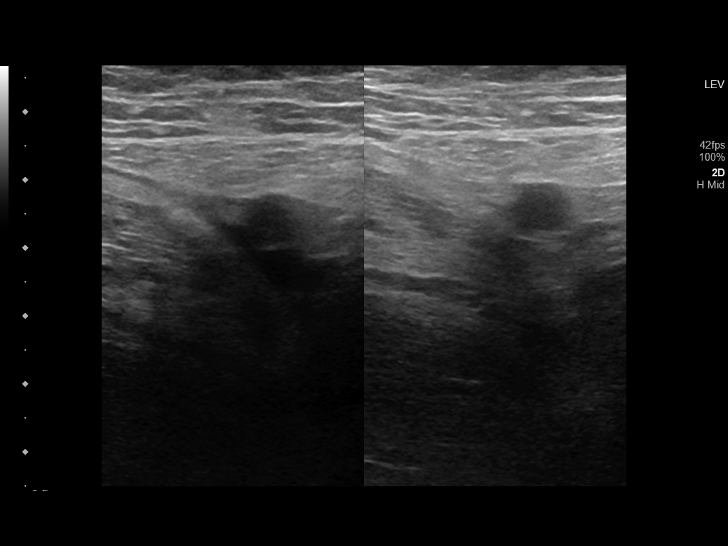
[im 48/62]
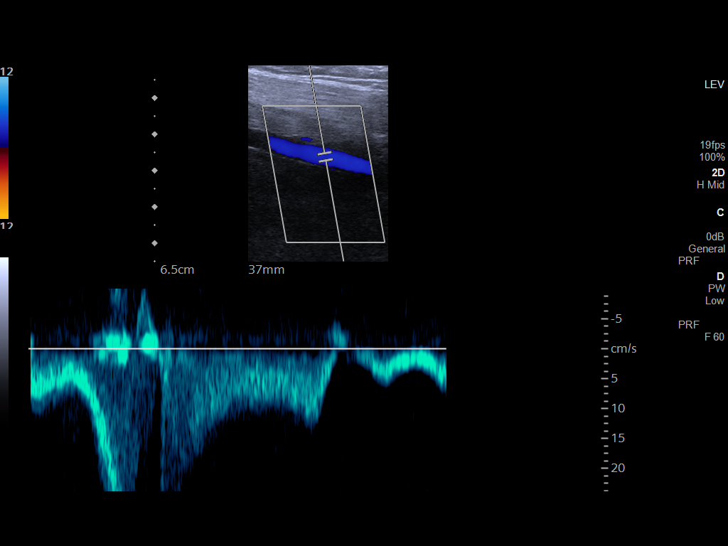
[im 51/62]
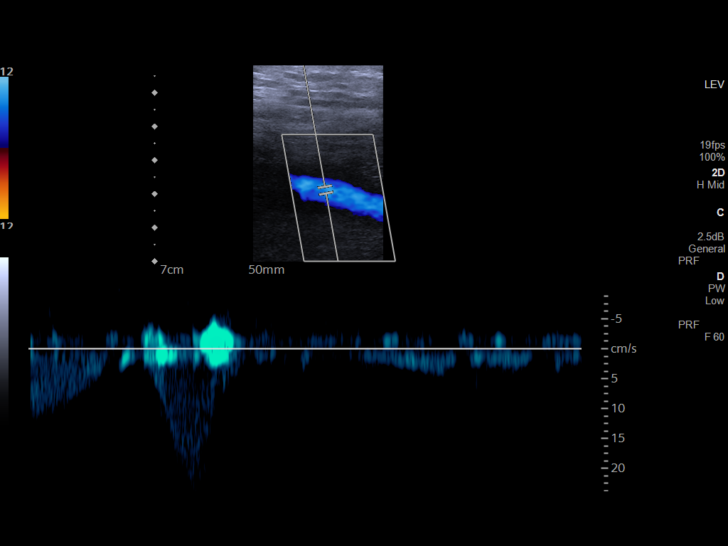
[im 56/62]
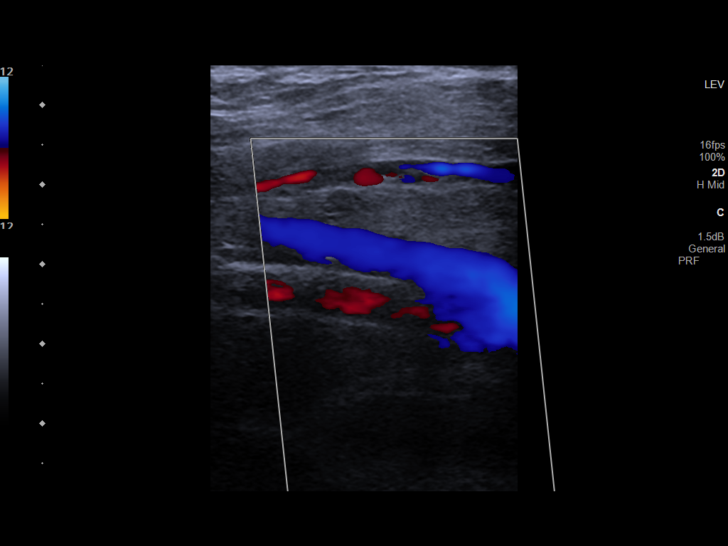
[im 62/62]
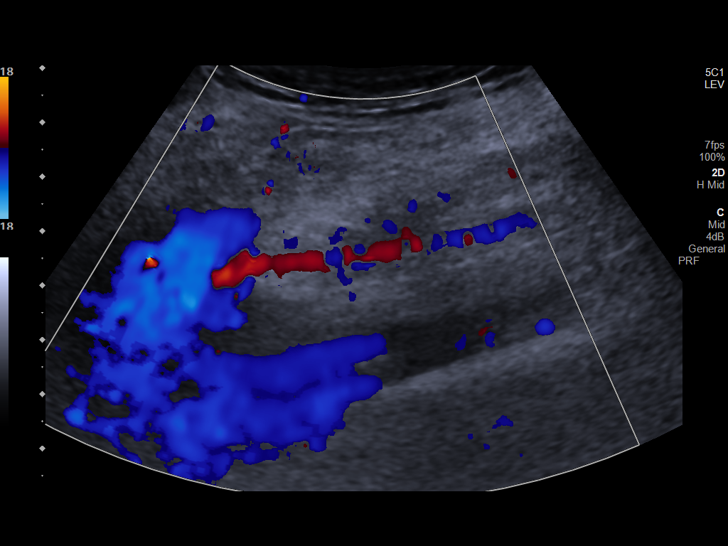

[14 of 24 positions shown; findings below may reference images not displayed]

FINDINGS: VENOUS

Normal compressibility of the common femoral, superficial femoral,
and popliteal veins, as well as the visualized calf veins.
Visualized portions of profunda femoral vein and great saphenous
vein unremarkable. No filling defects to suggest DVT on grayscale or
color Doppler imaging. Doppler waveforms show normal direction of
venous flow, normal respiratory plasticity and response to
augmentation.

Limited views of the contralateral common femoral vein are
unremarkable.

OTHER

None.

Limitations: none
IMPRESSION: 1. No evidence of deep venous thrombosis in either lower extremity.

.

## 2022-11-03 ENCOUNTER — Ambulatory Visit: Payer: BLUE CROSS/BLUE SHIELD

## 2022-11-08 ENCOUNTER — Ambulatory Visit: Payer: BLUE CROSS/BLUE SHIELD

## 2022-11-10 ENCOUNTER — Ambulatory Visit: Payer: Medicare Other | Admitting: Internal Medicine

## 2022-11-18 ENCOUNTER — Other Ambulatory Visit: Payer: Medicare Other

## 2022-11-21 ENCOUNTER — Ambulatory Visit: Payer: Medicare Other | Attending: Cardiology

## 2022-11-21 ENCOUNTER — Ambulatory Visit: Payer: Medicare Other

## 2022-11-21 DIAGNOSIS — I5042 Chronic combined systolic (congestive) and diastolic (congestive) heart failure: Secondary | ICD-10-CM

## 2022-11-21 LAB — ECHOCARDIOGRAM LIMITED
Est EF: 50
S' Lateral: 2.8 cm

## 2022-11-21 NOTE — Progress Notes (Signed)
Heart squeeze slightly improved to 50%, mild leakage noted in one valve. Over all reassuring study. Continue current medication regimen and keep previously scheduled follow-up.

## 2022-11-30 NOTE — Progress Notes (Unsigned)
Follow-up Outpatient Visit Date: 12/01/2022  Primary Care Provider: Derinda Late, MD [phone] [address] [facility] [place] [address]  Chief Complaint: Follow-up heart failure  HPI:  [name] is a 82 y.o. 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  He was last seen in our office in 08/2022 by Brian Nordmann, NP, in November at which time he was doing fairly well.  He had been seen in the ED in October with shortness of breath and volume overload.  At the time of his follow-up visit, he was feeling well without further dyspnea or edema.  He underwent limited echocardiogram last month, showing low normal LVEF (50%) with mild mitral regurgitation.  Today, Mr. Pacheco reports that he is feeling fairly well though he sadly reports that his wife passed away last week.  She had been diagnosed with breast and lung cancer and ultimately succumbed to a concurrent pneumonia.  She was 75.  Mr. Pacheco notes some waxing and waning left leg edema as well as left knee pain for which she is using furosemide daily.  His weights have been stable.  He denies chest pain, shortness of breath, palpitations, lightheadedness, and claudication.  He continues to use sildenafil daily, as this helps with urinary retention.  He notes that in the past he has felt depressed and generally unwell on high-dose statin therapy.  He seems to be tolerating his current regimen of ezetimibe and rosuvastatin 5 mg daily well.  He continues to follow with Dr. Lucky Cowboy for surveillance of his  PAD.  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[place])    liver replacement per pt report   Liver transplant status ([place])    Peripheral vascular disease ([place])    Past Surgical History:  Procedure Laterality Date   CAROTID ENDARTERECTOMY     CHOLECYSTECTOMY     HERNIA REPAIR     umbilical incarcerated   IR ANGIOGRAM EXTREMITY BILATERAL Bilateral    patient states he has stents in both legs   LIVER TRANSPLANT  2003   LOWER EXTREMITY ANGIOGRAPHY Left 10/18/2018   Procedure: LOWER EXTREMITY ANGIOGRAPHY;  Surgeon: Algernon Huxley, MD;  Location: [facility];  Service: Cardiovascular;  Laterality: Left;   TONSILLECTOMY     VASECTOMY  1977    Current Meds  Medication Sig   albuterol (PROVENTIL HFA;VENTOLIN HFA) 108 (90 Base) MCG/ACT inhaler Inhale 2 puffs into the lungs every 4 (four) hours as needed.    amLODipine (NORVASC) 5 MG tablet Take 1 tablet (5 mg total) by mouth daily.   B Complex-C (B-COMPLEX WITH VITAMIN C) tablet Take 1 tablet by mouth. Three times per week   bisoprolol (ZEBETA) 5 MG tablet Take 1 tablet (5 mg total) by mouth daily.  Cholecalciferol (VITAMIN D3) 50 MCG (2000 UT) TABS Take by mouth every other day.   clopidogrel (PLAVIX) 75 MG tablet Take 75 mg by mouth daily.    Dupilumab 300 MG/2ML SOPN Inject 300 mg into the skin every 14 (fourteen) days.   ezetimibe (ZETIA) 10 MG tablet Take 1 tablet (10 mg total) by mouth daily.   fenofibrate 160 MG tablet Take 160 mg by mouth daily.    Fluticasone-Umeclidin-Vilant (TRELEGY ELLIPTA) 100-62.5-25  MCG/INH AEPB Inhale 1 puff into the lungs daily.   furosemide (LASIX) 20 MG tablet Take 1 tablet (20 mg total) by mouth daily.   glipiZIDE (GLUCOTROL) 5 MG tablet Take 5 mg by mouth 2 (two) times daily before a meal.    Melatonin 5 MG CHEW Chew 5 mg by mouth at bedtime as needed.   metFORMIN (GLUCOPHAGE) 500 MG tablet Take 500 mg by mouth 2 (two) times daily with a meal.    Multiple Vitamins-Minerals (MENS MULTIVITAMIN) CHEW Chew by mouth. Takes 2-3 days per week   mycophenolate (CELLCEPT) 250 MG capsule Take 250 mg by mouth 2 (two) times daily.   omega-3 acid ethyl esters (LOVAZA) 1 g capsule Take 1 g by mouth daily.   omeprazole (PRILOSEC) 20 MG capsule Take 20 mg by mouth daily.    rosuvastatin (CRESTOR) 5 MG tablet Take 2.5 mg by mouth at bedtime.    sildenafil (VIAGRA) 100 MG tablet Take 100 mg by mouth daily.   tamsulosin (FLOMAX) 0.4 MG CAPS capsule Take 0.4 mg by mouth daily.    venlafaxine (EFFEXOR) 75 MG tablet Take 75 mg by mouth 3 (three) times daily.    vitamin E 180 MG (400 UNITS) capsule Take 400 Units by mouth. Takes 3 days per week    Allergies: Hydralazine, Fenofibrate, Pravastatin, Benazepril, and Sitagliptin  Social History   Tobacco Use   Smoking status: Former    Packs/day: 0.25    Years: 60.00    Total pack years: 15.00    Types: Cigarettes    Quit date: 10/15/2018    Years since quitting: 4.1   Smokeless tobacco: Never   Tobacco comments:    currently going [facility] smoking cessation clinic [facility] smoked 1 ppd until recently, down to 7 a day  Substance Use Topics   Alcohol use: Not Currently    Comment: history of alcohol abuse quit 2003   Drug use: Never    Family History  Problem Relation Age of Onset   Heart disease Father     Review of Systems: A 12-system review of systems was performed and was negative except as noted in the HPI.  --------------------------------------------------------------------------------------------------  Physical  Exam: BP 118/60 (BP Location: Left Arm, Patient Position: Sitting, Cuff Size: Normal)   Pulse 84   Ht '5\' 7"'$  (1.702 m)   Wt 217 lb (98.4 kg)   SpO2 98%   BMI 33.99 kg/m   General:  NAD. Neck: No JVD or HJR. Lungs: Clear to auscultation bilaterally without wheezes or crackles. Heart: Regular rate and rhythm without murmurs, rubs, or gallops. Abdomen: Soft, nontender, nondistended. Extremities: Trace pretibial edema bilaterally.  2+ pedal pulses bilaterally.  Lab Results  Component Value Date   WBC 12.2 (H) 08/26/2022   HGB 11.4 (L) 08/26/2022   HCT 34.8 (L) 08/26/2022   MCV 93.0 08/26/2022   PLT 214 08/26/2022    Lab Results  Component Value Date   NA 136 08/26/2022   K 4.3 08/26/2022   CL 101 08/26/2022  CO2 25 08/26/2022   BUN 53 (H) 08/26/2022   CREATININE 2.40 (H) 08/26/2022   GLUCOSE 202 (H) 08/26/2022   ALT 16 08/26/2022    Lab Results  Component Value Date   CHOL 219 (H) 02/06/2022   HDL 40 (L) 02/06/2022   LDLCALC 152 (H) 02/06/2022   TRIG 134 02/06/2022   CHOLHDL 5.5 02/06/2022    --------------------------------------------------------------------------------------------------  ASSESSMENT AND PLAN: Chronic HFrEF with recovered ejection fraction: Mr. Dorantes appears euvolemic today with NYHA class I-II symptoms (functional capacity limited primarily by knee pain).  We have agreed to continue his current regimen of furosemide 20 mg daily for volume control.  As well as GDMT with bisoprolol.  Given normalization of LVEF, chronic kidney disease, and intolerance of benazepril, we will defer rechallenging him with an ACE inhibitor or ARB.  Nonobstructive coronary artery disease: No angina reported.  Continue medical therapy to prevent progression of disease, including lipid control (see details below) and clopidogrel.  PAD: No claudication reported.  Due to suboptimal lipid control was on last check in 01/2022, escalation of lipid therapy will likely need to  be pursued (see details below).  Ongoing surveillance per Dr. Lucky Cowboy.  Hyperlipidemia associated with type 2 diabetes mellitus: Last lipid panel in 01/2022 was notable for elevated LDL of 152 and HDL of 40.  Mr. Chiarelli is tolerating rosuvastatin 5 mg daily and ezetimibe 10 mg daily well.  He has had issues with depression and generalized malaise when on more aggressive statin therapy.  Will plan to repeat a lipid panel today.  If his LDL remains above 70, we have agreed to increase rosuvastatin to 10 mg daily.  If he is intolerant of this, we may need to consider a PCSK9 inhibitor in the lieu of statin therapy to achieve our goal LDL.  Chronic kidney disease stage 3b: Creatinine little better on last check a week ago at 1.7.  Continue close follow-up with nephrology; avoid nephrotoxic agents if possible.  NAFLD status post liver transplant: Ongoing management per hepatology.  Follow-up: Return to clinic in 6 months.  Nelva Bush, MD 12/01/2022 11:45 AM

## 2022-12-01 ENCOUNTER — Encounter: Payer: Self-pay | Admitting: Internal Medicine

## 2022-12-01 ENCOUNTER — Other Ambulatory Visit
Admission: RE | Admit: 2022-12-01 | Discharge: 2022-12-01 | Disposition: A | Discharge status: Home / Self Care | Payer: Medicare Other | Source: Ambulatory Visit | Attending: Internal Medicine | Admitting: Internal Medicine

## 2022-12-01 ENCOUNTER — Ambulatory Visit: Payer: Medicare Other | Attending: Internal Medicine | Admitting: Internal Medicine

## 2022-12-01 VITALS — BP 118/60 | HR 84 | Ht 67.0 in | Wt 217.0 lb

## 2022-12-01 DIAGNOSIS — I2510 Atherosclerotic heart disease of native coronary artery without angina pectoris: Secondary | ICD-10-CM | POA: Diagnosis not present

## 2022-12-01 DIAGNOSIS — I5022 Chronic systolic (congestive) heart failure: Secondary | ICD-10-CM

## 2022-12-01 DIAGNOSIS — E1169 Type 2 diabetes mellitus with other specified complication: Secondary | ICD-10-CM | POA: Diagnosis not present

## 2022-12-01 DIAGNOSIS — Z944 Liver transplant status: Secondary | ICD-10-CM

## 2022-12-01 DIAGNOSIS — Z79899 Other long term (current) drug therapy: Secondary | ICD-10-CM | POA: Diagnosis present

## 2022-12-01 DIAGNOSIS — I739 Peripheral vascular disease, unspecified: Secondary | ICD-10-CM | POA: Diagnosis not present

## 2022-12-01 DIAGNOSIS — K760 Fatty (change of) liver, not elsewhere classified: Secondary | ICD-10-CM

## 2022-12-01 DIAGNOSIS — N1832 Chronic kidney disease, stage 3b: Secondary | ICD-10-CM

## 2022-12-01 DIAGNOSIS — E785 Hyperlipidemia, unspecified: Secondary | ICD-10-CM

## 2022-12-01 LAB — LIPID PANEL
Cholesterol: 166 mg/dL (ref 0–200)
HDL: 29 mg/dL — ABNORMAL LOW (ref >40)
LDL Cholesterol: UNDETERMINED mg/dL (ref 0–99)
Total CHOL/HDL Ratio: 5.7 RATIO
Triglycerides: 439 mg/dL — ABNORMAL HIGH (ref <150)
VLDL: UNDETERMINED mg/dL (ref 0–40)

## 2022-12-01 LAB — LDL CHOLESTEROL, DIRECT: Direct LDL: 91 mg/dL (ref 0–99)

## 2022-12-01 NOTE — Patient Instructions (Signed)
Medication Instructions:  Your Physician recommend you continue on your current medication as directed.    *If you need a refill on your cardiac medications before your next appointment, please call your pharmacy*   Lab Work: Your provider would like for you to have following labs drawn: (Lipid).   Please go to the [facility] entrance and check in at the front desk.  You do not need an appointment.  They are open from 7am-6 pm.     Testing/Procedures: None ordered today   Follow-Up: At [facility], you and your health needs are our priority.  As part of our continuing mission to provide you with exceptional heart care, we have created designated Provider Care Teams.  These Care Teams include your primary Cardiologist (physician) and Advanced Practice Providers (APPs -  Physician Assistants and Nurse Practitioners) who all work together to provide you with the care you need, when you need it.  We recommend signing up for the patient portal called "MyChart".  Sign up information is provided on this After Visit Summary.  MyChart is used to connect with patients for Virtual Visits (Telemedicine).  Patients are able to view lab/test results, encounter notes, upcoming appointments, etc.  Non-urgent messages can be sent to your provider as well.   To learn more about what you can do with MyChart, go to [email or web address].    Your next appointment:   6 month(s)  Provider:   You may see Nelva Bush, MD or one of the following Advanced Practice Providers on your designated Care Team:   [name], NP Christell Faith, PA-C Cadence Kathlen Mody, PA-C Gerrie Nordmann, NP

## 2022-12-05 ENCOUNTER — Telehealth: Payer: Self-pay | Admitting: Internal Medicine

## 2022-12-05 NOTE — Telephone Encounter (Signed)
Attempted to reach [name] by phone to discuss results of lipid panel with elevated triglycerides and direct LDL but did not receive an answer.  Voicemail also unable.  I will try reaching out again later today or tomorrow to discuss results and possible escalation of rosuvastatin versus alternate therapy for secondary prevention of ASCVD.  Nelva Bush, MD [facility]

## 2022-12-07 ENCOUNTER — Telehealth: Payer: Self-pay | Admitting: Internal Medicine

## 2022-12-07 MED ORDER — ROSUVASTATIN CALCIUM 10 MG PO TABS: 10.0000 mg | ORAL_TABLET | Freq: Every day | ORAL | 11 refills | Status: AC

## 2022-12-07 NOTE — Telephone Encounter (Signed)
Pt made aware of lab results along with MD's recommendations. Pt agreeable to increase rosuvastatin to 10 mg daily as previously discussed and will contact office if he develops any issues.    Nelva Bush, MD 12/05/2022  2:53 PM EST Back to Top    I attempted to reach Brian Pacheco by phone but did not receive an answer.  Given suboptimally controlled LDL and triglycerides, I would favor escalation of rosuvastatin, if he is in agreement to due intolerance of some statin therapy in the past.  If he does not wish to escalate rosuvastatin, I would favor having him be seen in our lipid clinic to discuss other treatment options that would not interfere with his history of liver transplant on ongoing immunosuppression.

## 2022-12-07 NOTE — Telephone Encounter (Signed)
Unable to reach Mr. Mccaughey by phone at this time; voice mailbox is not set up.  Nelva Bush, MD [facility]

## 2022-12-07 NOTE — Telephone Encounter (Signed)
Patient was returning phone call from Dr. Saunders Revel

## 2022-12-22 ENCOUNTER — Ambulatory Visit: Payer: Self-pay | Admitting: Internal Medicine

## 2023-03-06 ENCOUNTER — Other Ambulatory Visit (INDEPENDENT_AMBULATORY_CARE_PROVIDER_SITE_OTHER): Payer: Self-pay | Admitting: Vascular Surgery

## 2023-03-06 DIAGNOSIS — Z9889 Other specified postprocedural states: Secondary | ICD-10-CM

## 2023-03-07 ENCOUNTER — Ambulatory Visit (INDEPENDENT_AMBULATORY_CARE_PROVIDER_SITE_OTHER): Payer: Medicare PPO

## 2023-03-07 ENCOUNTER — Ambulatory Visit (INDEPENDENT_AMBULATORY_CARE_PROVIDER_SITE_OTHER): Payer: Medicare PPO | Admitting: Nurse Practitioner

## 2023-03-07 ENCOUNTER — Encounter (INDEPENDENT_AMBULATORY_CARE_PROVIDER_SITE_OTHER): Payer: Self-pay | Admitting: Nurse Practitioner

## 2023-03-07 VITALS — BP 145/73 | HR 75 | Resp 16 | Wt 223.4 lb

## 2023-03-07 DIAGNOSIS — E1121 Type 2 diabetes mellitus with diabetic nephropathy: Secondary | ICD-10-CM | POA: Diagnosis not present

## 2023-03-07 DIAGNOSIS — Z9889 Other specified postprocedural states: Secondary | ICD-10-CM

## 2023-03-07 DIAGNOSIS — E1169 Type 2 diabetes mellitus with other specified complication: Secondary | ICD-10-CM

## 2023-03-07 DIAGNOSIS — I739 Peripheral vascular disease, unspecified: Secondary | ICD-10-CM

## 2023-03-07 DIAGNOSIS — E785 Hyperlipidemia, unspecified: Secondary | ICD-10-CM

## 2023-03-07 DIAGNOSIS — Z794 Long term (current) use of insulin: Secondary | ICD-10-CM

## 2023-03-08 ENCOUNTER — Encounter (INDEPENDENT_AMBULATORY_CARE_PROVIDER_SITE_OTHER): Payer: Self-pay | Admitting: Nurse Practitioner

## 2023-03-08 LAB — VAS US ABI WITH/WO TBI
Left ABI: 1.09
Right ABI: 0.84

## 2023-03-08 NOTE — Progress Notes (Signed)
Subjective:    Patient ID: Brian Pacheco, male    DOB: 11-Jan-1941, 82 y.o.   MRN: [id number] Chief Complaint  Patient presents with   Follow-up    Ultrasound follow up    The patient returns to the office for followup and review of the noninvasive studies.   There have been no interval changes in lower extremity symptoms. No interval shortening of the patient's claudication distance or development of rest pain symptoms. No new ulcers or wounds have occurred since the last visit.  There have been no significant changes to the patient's overall health care.  The patient also has some noted lower back issues as well  The patient denies amaurosis fugax or recent TIA symptoms. There are no documented recent neurological changes noted. There is no history of DVT, PE or superficial thrombophlebitis. The patient denies recent episodes of angina or shortness of breath.   ABI Rt=0.84 and Lt=1.09  (previous ABI's Rt=0.79 and Lt=1.15) Duplex ultrasound of the right lower extremity shows monophasic/biphasic waveforms with biphasic waveforms on the left.  Normal toe waveforms bilaterally    Review of Systems  Cardiovascular:        Claudication  Musculoskeletal:  Positive for arthralgias and back pain.  All other systems reviewed and are negative.      Objective:   Physical Exam Vitals reviewed.  HENT:     Head: Normocephalic.  Cardiovascular:     Rate and Rhythm: Normal rate.     Pulses:          Dorsalis pedis pulses are detected w/ Doppler on the right side and detected w/ Doppler on the left side.       Posterior tibial pulses are detected w/ Doppler on the right side and detected w/ Doppler on the left side.  Pulmonary:     Effort: Pulmonary effort is normal.  Skin:    General: Skin is warm and dry.  Neurological:     Mental Status: He is alert and oriented to person, place, and time.  Psychiatric:        Mood and Affect: Mood normal.        Behavior: Behavior normal.         Thought Content: Thought content normal.        Judgment: Judgment normal.     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[facility])    liver replacement per pt report   Liver transplant status ([facility])    Peripheral vascular disease ([facility])     Social History   Socioeconomic History   Marital status: Widowed    Spouse name: Not on file   Number of children: Not on file   Years of education: Not on file   Highest education level: Not on file  Occupational History   Not on file  Tobacco Use   Smoking status: Former    Packs/day: 0.25    Years: 60.00  Additional pack years: 0.00    Total pack years: 15.00    Types: Cigarettes    Quit date: 10/15/2018    Years since quitting: 4.3   Smokeless tobacco: Never   Tobacco comments:    currently going to smoking cessation clinic at [facility], smoked 1 ppd until recently, down to 7 a day  Substance and Sexual Activity   Alcohol use: Not Currently    Comment: history of alcohol abuse quit 2003   Drug use: Never   Sexual activity: Not on file  Other Topics Concern   Not on file  Social History Narrative   Not on file   Social Determinants of Health   Financial Resource Strain: Not on file  Food Insecurity: Not on file  Transportation Needs: Not on file  Physical Activity: Not on file  Stress: Not on file  Social Connections: Not on file  Intimate Partner Violence: Not on file    Past Surgical History:  Procedure Laterality Date   CAROTID ENDARTERECTOMY     CHOLECYSTECTOMY     HERNIA REPAIR      umbilical incarcerated   IR ANGIOGRAM EXTREMITY BILATERAL Bilateral    patient states he has stents in both legs   LIVER TRANSPLANT  2003   LOWER EXTREMITY ANGIOGRAPHY Left 10/18/2018   Procedure: LOWER EXTREMITY ANGIOGRAPHY;  Surgeon: Annice Needy, MD;  Location: [facility];  Service: Cardiovascular;  Laterality: Left;   TONSILLECTOMY     VASECTOMY  1977    Family History  Problem Relation Age of Onset   Heart disease Father     Allergies  Allergen Reactions   Hydralazine Rash and Shortness Of Breath   Fenofibrate Other (See Comments)   Pravastatin     Other reaction(s): Other (See Comments)   Benazepril     Other reaction(s): Other (See Comments)   Sitagliptin Other (See Comments) and Rash    blisters Other reaction(s): Other (see comments) Other reaction(s): Other (See Comments) blisters blisters blisters Other reaction(s): Other (See Comments) blisters blisters        Latest Ref Rng & Units 08/26/2022    3:06 PM 02/11/2022    6:38 AM 02/10/2022    6:36 AM  CBC  WBC 4.0 - 10.5 K/uL 12.2  9.7  9.4   Hemoglobin 13.0 - 17.0 g/dL 40.9  81.1  91.4   Hematocrit 39.0 - 52.0 % 34.8  42.2  40.5   Platelets 150 - 400 K/uL 214  231  215       CMP     Component Value Date/Time   NA 136 08/26/2022 1506   K 4.3 08/26/2022 1506   CL 101 08/26/2022 1506   CO2 25 08/26/2022 1506   GLUCOSE 202 (H) 08/26/2022 1506   BUN 53 (H) 08/26/2022 1506   CREATININE 2.40 (H) 08/26/2022 1506   CALCIUM 8.5 (L) 08/26/2022 1506   PROT 6.5 08/26/2022 1506   ALBUMIN 3.0 (L) 08/26/2022 1506   AST 22 08/26/2022 1506   ALT 16 08/26/2022 1506   ALKPHOS 67 08/26/2022 1506   BILITOT 0.6 08/26/2022 1506   GFRNONAA 27 (L) 08/26/2022 1506   GFRAA 40 (L) 04/04/2019 0427     No results found.     Assessment & Plan:   1. Peripheral arterial disease with history of revascularization (HCC)  Recommend:  The patient has evidence of atherosclerosis of the lower extremities with  claudication.  The patient does not voice lifestyle limiting changes at this point  in time.  Noninvasive studies do not suggest clinically significant change.  No invasive studies, angiography or surgery at this time The patient should continue walking and begin a more formal exercise program.  The patient should continue antiplatelet therapy and aggressive treatment of the lipid abnormalities  No changes in the patient's medications at this time  Continued surveillance is indicated as atherosclerosis is likely to progress with time.    The patient will continue follow up with noninvasive studies as ordered.   2. Hyperlipidemia associated with type 2 diabetes mellitus (HCC) Continue statin as ordered and reviewed, no changes at this time  3. Controlled type 2 diabetes mellitus with diabetic nephropathy, with long-term current use of insulin (HCC) Continue hypoglycemic medications as already ordered, these medications have been reviewed and there are no changes at this time.  Hgb A1C to be monitored as already arranged by primary service   Current Outpatient Medications on File Prior to Visit  Medication Sig Dispense Refill   albuterol (PROVENTIL HFA;VENTOLIN HFA) 108 (90 Base) MCG/ACT inhaler Inhale 2 puffs into the lungs every 4 (four) hours as needed.      B Complex-C (B-COMPLEX WITH VITAMIN C) tablet Take 1 tablet by mouth. Three times per week     Cholecalciferol (VITAMIN D3) 50 MCG (2000 UT) TABS Take by mouth every other day.     clopidogrel (PLAVIX) 75 MG tablet Take 75 mg by mouth daily.      Dupilumab 300 MG/2ML SOPN Inject 300 mg into the skin every 14 (fourteen) days.     fenofibrate 160 MG tablet Take 160 mg by mouth daily.      Fluticasone-Umeclidin-Vilant (TRELEGY ELLIPTA) 100-62.5-25 MCG/INH AEPB Inhale 1 puff into the lungs daily.     glipiZIDE (GLUCOTROL) 5 MG tablet Take 5 mg by mouth 2 (two) times daily before a meal.      Melatonin 5 MG CHEW Chew 5 mg by mouth at  bedtime as needed.     metFORMIN (GLUCOPHAGE) 500 MG tablet Take 500 mg by mouth 2 (two) times daily with a meal.      Multiple Vitamins-Minerals (MENS MULTIVITAMIN) CHEW Chew by mouth. Takes 2-3 days per week     mycophenolate (CELLCEPT) 250 MG capsule Take 250 mg by mouth 2 (two) times daily.     omega-3 acid ethyl esters (LOVAZA) 1 g capsule Take 1 g by mouth daily.     omeprazole (PRILOSEC) 20 MG capsule Take 20 mg by mouth daily.      rosuvastatin (CRESTOR) 10 MG tablet Take 1 tablet (10 mg total) by mouth at bedtime. 30 tablet 11   sildenafil (VIAGRA) 100 MG tablet Take 100 mg by mouth daily.     tamsulosin (FLOMAX) 0.4 MG CAPS capsule Take 0.4 mg by mouth daily.      venlafaxine (EFFEXOR) 75 MG tablet Take 75 mg by mouth 3 (three) times daily.      vitamin E 180 MG (400 UNITS) capsule Take 400 Units by mouth. Takes 3 days per week     amLODipine (NORVASC) 5 MG tablet Take 1 tablet (5 mg total) by mouth daily. 30 tablet 0   bisoprolol (ZEBETA) 5 MG tablet Take 1 tablet (5 mg total) by mouth daily. 30 tablet 0   ezetimibe (ZETIA) 10 MG tablet Take 1 tablet (10 mg total) by mouth daily. 30 tablet 0   furosemide (LASIX) 20 MG tablet Take 1 tablet (20 mg total) by mouth daily. 30 tablet 0  No current facility-administered medications on file prior to visit.    There are no Patient Instructions on file for this visit. No follow-ups on file.   Kris Hartmann, NP

## 2024-02-15 ENCOUNTER — Ambulatory Visit: Payer: Medicare Other | Admitting: Internal Medicine

## 2024-02-15 NOTE — Progress Notes (Deleted)
  Cardiology Office Note:  .   Date:  02/15/2024  ID:  Brian Pacheco, DOB 02-Sep-1941, MRN [id number] PCP: Brian Banter, MD  Brian Pacheco Cardiologist:  Brian Crisp, MD { Click to update primary MD,subspecialty MD or APP then REFRESH:1}    History of Present Illness: Brian Pacheco   Brian Pacheco is a 83 y.o. 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  I last saw him in 12/2022, at which time he was feeling fairly well though he reported that his wife had passed away a week earlier.  We did not pursue any further testing or medication changes.  ROS: See HPI  Studies Reviewed: .        Limited TTE (11/21/2022): LVEF 50%.  Normal RV size with low normal function.  Mild mitral regurgitation.  Risk Assessment/Calculations:   {Does this patient have ATRIAL FIBRILLATION?:[id number]} No BP recorded.  {Refresh Note OR Click here to enter BP  :1}***       Physical Exam:   VS:  There were no vitals taken for this visit.   Wt Readings from Last 3 Encounters:  03/07/23 223 lb 6.4 oz (101.3 kg)  12/01/22 217 lb (98.4 kg)  09/07/22 211 lb 8 oz (95.9 kg)    General:  NAD. Neck: No JVD or HJR. Lungs: Clear to auscultation bilaterally without wheezes or crackles. Heart: Regular rate and rhythm without murmurs, rubs, or gallops. Abdomen: Soft, nontender, nondistended. Extremities: No lower extremity edema.  ASSESSMENT AND PLAN: .    ***    {Are you ordering a CV Procedure (e.g. stress test, cath, DCCV, TEE, etc)?   Press F2        :[id number]}  Dispo:  ***  Signed, Brian Crisp, MD

## 2024-02-27 ENCOUNTER — Other Ambulatory Visit (INDEPENDENT_AMBULATORY_CARE_PROVIDER_SITE_OTHER): Payer: Self-pay | Admitting: Nurse Practitioner

## 2024-02-27 DIAGNOSIS — I739 Peripheral vascular disease, unspecified: Secondary | ICD-10-CM

## 2024-02-29 ENCOUNTER — Ambulatory Visit: Attending: Cardiology | Admitting: Cardiology

## 2024-02-29 NOTE — Progress Notes (Deleted)
 Cardiology Office Note:  .   Date:  02/29/2024  ID:  Brian Pacheco, DOB October 20, 1941, MRN [id number] PCP: Nestor Banter, MD  [facility] Providers Cardiologist:  Sammy Crisp, MD { Click to update primary MD,subspecialty MD or APP then REFRESH:1}   History of Present Illness: Brian Pacheco   Brian Pacheco is a 83 y.o. 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   He was hospitalized/05/2013 for dyspnea and volume overload.  CT of the chest was negative for PE but showed bilateral pulmonary edema.  High-sensitivity troponin peaked at 143, BNP was 236.  Echocardiogram revealed an LVEF of 40-45%, global hypokinesis, grade 3 diastolic dysfunction, mild mitral regurgitation, mild tricuspid stenosis, aortic valve sclerosis without evidence of stenosis.  However HMG reviewing the images the EF was found to be closer to 50-55%.  R/LHC was deferred given acute chronic stage III kidney disease.  He subsequently underwent Lexiscan  MPI during the admission which showed no significant ischemia with an EF of 29% which showed to be depressed secondary to GI uptake artifact.  Overall was moderate risk in the setting of a depressed EF.  He was diuresed with symptomatic improvement medication were optimized.  He was reevaluated at [facility] emergency department 08/26/2022 with shortness of breath.  Over a week he was having cold-like symptoms including cough, congestion, low-grade fevers, and sore throat.  Saw his PCP who treated him empirically for COVID.  He did test negative however for COVID.  He had called 911  because he felt short of breath with productive cough persisting of dark sputum.  Blood pressure on arrival to the emergency department was noted to be decreased pertinent labs revealed serum creatinine of 2.40, BUN 53, BNP 359.2, WBCs of 12.2, hemoglobin 11.4, hematocrit 34.8, D-dimer 0.66.  GFR precluded patient having CT of the chest at that time.  Difficulty to determine further workup for blood clot was reasonable given clinical improvement he was treated with an albuterol  inhaler prescription for antibiotics and was discharged home.   He was last seen in clinic 12/01/2022 by Dr End.  At that time he was feeling fairly well though he sadly reports his wife passed away a week prior.  His weights been stable.  He denied any chest pain shortness breath palpitations or lightheadedness.  He has been present doing well on high-dose statin therapy in the past.  He described management with 10 mg rosuvastatin  5 mg daily.  He is to continue to follow-up with Dr. Rozelle Corning for surveillance of his PAD.  His rosuvastatin  was increased to 10 mg daily.  There were no other medication changes were made no further testing that was ordered.  He returns to clinic today  ROS: 10 point review of systems has been reviewed and considered negative except ones been listed in the HPI  Studies Reviewed: .       Limited 2D echo 11/21/2022 1. Left ventricular ejection fraction, by estimation, is 50%. The left  ventricle has low normal function.   2. Right ventricular systolic function is low normal. The right  ventricular size is normal.   3. The mitral valve is normal in structure. Mild mitral valve  regurgitation.   4. The inferior vena cava is normal in size with greater than 50%  respiratory variability, suggesting right atrial pressure of 3 mmHg.   Comparison(s): EF 40-45%.   Lexiscan  MPI 02/10/2022 Pharmacological myocardial perfusion imaging study with no significant  ischemia Fixed defects in the distal  anteroseptal, apical region, Fixed inferior wall defect, unable to exclude diaphragmatic attenuation artifact Normal wall motion, EF estimated at 29% (likely depressed secondary to GI uptake artifact) No EKG changes concerning for ischemia at peak stress or in recovery. Attenuation correction images not available Moderate risk scan in the setting of depressed ejection fraction Risk Assessment/Calculations:     No BP recorded.  {Refresh Note OR Click here to enter BP  :1}***       Physical Exam:   VS:  There were no vitals taken for this visit.   Wt Readings from Last 3 Encounters:  03/07/23 223 lb 6.4 oz (101.3 kg)  12/01/22 217 lb (98.4 kg)  09/07/22 211 lb 8 oz (95.9 kg)    GEN: Well nourished, well developed in no acute distress NECK: No JVD; No carotid bruits CARDIAC: ***RRR, no murmurs, rubs, gallops RESPIRATORY:  Clear to auscultation without rales, wheezing or rhonchi  ABDOMEN: Soft, non-tender, non-distended EXTREMITIES:  No edema; No deformity   ASSESSMENT AND PLAN: .   Chronic HFrEF with recovered ejection fraction Nonobstructive coronary artery disease Peripheral arterial disease Mixed hyperlipidemia Chronic kidney stage IIIb NAF LD    {Are you ordering a CV Procedure (e.g. stress test, cath, DCCV, TEE, etc)?   Press F2        :[id number]}  Dispo: ***  Signed, Jannelly Bergren, NP

## 2024-03-06 ENCOUNTER — Ambulatory Visit (INDEPENDENT_AMBULATORY_CARE_PROVIDER_SITE_OTHER): Payer: Medicare PPO | Admitting: Nurse Practitioner

## 2024-03-06 ENCOUNTER — Encounter (INDEPENDENT_AMBULATORY_CARE_PROVIDER_SITE_OTHER): Payer: Medicare PPO

## 2024-03-15 ENCOUNTER — Ambulatory Visit (INDEPENDENT_AMBULATORY_CARE_PROVIDER_SITE_OTHER): Admitting: Nurse Practitioner

## 2024-03-15 ENCOUNTER — Ambulatory Visit (INDEPENDENT_AMBULATORY_CARE_PROVIDER_SITE_OTHER)

## 2024-03-15 ENCOUNTER — Encounter (INDEPENDENT_AMBULATORY_CARE_PROVIDER_SITE_OTHER): Payer: Self-pay | Admitting: Nurse Practitioner

## 2024-03-15 VITALS — BP 148/84 | HR 80 | Resp 18 | Ht 67.0 in | Wt 201.4 lb

## 2024-03-15 DIAGNOSIS — I739 Peripheral vascular disease, unspecified: Secondary | ICD-10-CM | POA: Diagnosis not present

## 2024-03-15 DIAGNOSIS — E1121 Type 2 diabetes mellitus with diabetic nephropathy: Secondary | ICD-10-CM

## 2024-03-15 DIAGNOSIS — Z9889 Other specified postprocedural states: Secondary | ICD-10-CM | POA: Diagnosis not present

## 2024-03-15 DIAGNOSIS — Z794 Long term (current) use of insulin: Secondary | ICD-10-CM

## 2024-03-15 DIAGNOSIS — E1169 Type 2 diabetes mellitus with other specified complication: Secondary | ICD-10-CM

## 2024-03-15 DIAGNOSIS — M159 Polyosteoarthritis, unspecified: Secondary | ICD-10-CM | POA: Diagnosis not present

## 2024-03-15 DIAGNOSIS — E785 Hyperlipidemia, unspecified: Secondary | ICD-10-CM

## 2024-03-17 NOTE — Progress Notes (Signed)
 Subjective:    Patient ID: Brian Pacheco, male    DOB: 10/25/1941, 83 y.o.   MRN: [id number] Chief Complaint  Patient presents with   Follow-up    1 year ABI    The patient returns to the office for followup and review of the noninvasive studies.  He previously had intervention on the left lower extremity in 2019.  He notes he has also had intervention on the right done by us   There have been no interval changes in lower extremity symptoms. No interval shortening of the patient's claudication distance or development of rest pain symptoms. No new ulcers or wounds have occurred since the last visit.  There have been no significant changes to the patient's overall health care.  His largest complaint is of pain in his hips and knees.  The patient also has some noted lower back issues as well  The patient denies amaurosis fugax or recent TIA symptoms. There are no documented recent neurological changes noted. There is no history of DVT, PE or superficial thrombophlebitis. The patient denies recent episodes of angina or shortness of breath.   ABI Rt=0.69 and Lt=0.95 (previous ABI's Rt=0.84 and Lt=1.09) Duplex ultrasound of the right lower extremity shows monophasic/biphasic waveforms with biphasic waveforms on the left.  Normal toe waveforms bilaterally.  His studies today are close similar to his previous studies that were done in 2023.    Review of Systems  Cardiovascular:        Claudication  Musculoskeletal:  Positive for arthralgias and back pain.  All other systems reviewed and are negative.      Objective:   Physical Exam Vitals reviewed.  HENT:     Head: Normocephalic.  Cardiovascular:     Rate and Rhythm: Normal rate.     Pulses:          Dorsalis pedis pulses are detected w/ Doppler on the right side and detected w/ Doppler on the left side.       Posterior tibial pulses are detected w/ Doppler on the right side and detected w/ Doppler on the left side.  Pulmonary:      Effort: Pulmonary effort is normal.  Skin:    General: Skin is warm and dry.  Neurological:     Mental Status: He is alert and oriented to person, place, and time.  Psychiatric:        Mood and Affect: Mood normal.        Behavior: Behavior normal.        Thought Content: Thought content normal.        Judgment: Judgment normal.     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)    liver replacement per pt report   Liver transplant status (HCC)    Peripheral vascular disease (HCC)     Social History   Socioeconomic History   Marital status: Widowed    Spouse name:  Not on file   Number of children: Not on file   Years of education: Not on file   Highest education level: Not on file  Occupational History   Not on file  Tobacco Use   Smoking status: Former    Current packs/day: 0.00    Average packs/day: 0.3 packs/day for 60.0 years (15.0 ttl pk-yrs)    Types: Cigarettes    Start date: 10/15/1958    Quit date: 10/15/2018    Years since quitting: 5.4   Smokeless tobacco: Never   Tobacco comments:    currently going to smoking cessation clinic at [facility], smoked 1 ppd until recently, down to 7 a day  Substance and Sexual Activity   Alcohol use: Not Currently    Comment: history of alcohol abuse quit 2003   Drug use: Never   Sexual activity: Not on file  Other Topics Concern   Not on file  Social History Narrative   Not on file   Social Drivers of Health   Financial Resource Strain: Patient Declined (09/22/2023)   Received from [facility]   Overall Financial Resource Strain (CARDIA)    Difficulty of Paying Living Expenses: Patient declined  Food Insecurity: Patient Declined (09/22/2023)   Received from [facility]   Hunger Vital Sign    Worried About Running Out of Food in the Last Year: Patient declined    Ran Out of Food in the Last Year: Patient declined  Transportation Needs: Patient Declined (09/22/2023)   Received from [facility]   PRAPARE - Transportation    In the past 12 months, has lack of transportation kept you from medical appointments or from getting medications?: Patient declined    Lack of Transportation (Non-Medical): Patient declined  Physical Activity: Not on file  Stress: Not on file  Social Connections: Not on file  Intimate Partner Violence: Not on file    Past Surgical History:  Procedure Laterality Date   CAROTID ENDARTERECTOMY     CHOLECYSTECTOMY     HERNIA REPAIR     umbilical incarcerated   IR ANGIOGRAM EXTREMITY BILATERAL Bilateral    patient states he has stents in both legs   LIVER TRANSPLANT  2003   LOWER EXTREMITY ANGIOGRAPHY Left 10/18/2018   Procedure: LOWER EXTREMITY ANGIOGRAPHY;  Surgeon: Celso College, MD;  Location: [facility] INVASIVE CV LAB;  Service: Cardiovascular;  Laterality: Left;   TONSILLECTOMY     VASECTOMY  1977    Family History  Problem Relation Age of Onset   Heart disease Father     Allergies  Allergen Reactions   Hydralazine  Rash and Shortness Of Breath   Fenofibrate  Other (See Comments)   Pravastatin     Other reaction(s): Other (See Comments)   Benazepril     Other reaction(s): Other (See Comments)   Sitagliptin Other (See Comments) and Rash    blisters Other reaction(s): Other (see comments) Other reaction(s): Other (See Comments) blisters blisters blisters Other reaction(s): Other (See Comments) blisters blisters        Latest Ref Rng & Units 08/26/2022    3:06 PM 02/11/2022     6:38 AM 02/10/2022    6:36 AM  CBC  WBC 4.0 - 10.5 K/uL 12.2  9.7  9.4   Hemoglobin 13.0 - 17.0 g/dL 13.2  44.0  10.2   Hematocrit 39.0 - 52.0 % 34.8  42.2  40.5   Platelets 150 - 400 K/uL 214  231  215  CMP     Component Value Date/Time   NA 136 08/26/2022 1506   K 4.3 08/26/2022 1506   CL 101 08/26/2022 1506   CO2 25 08/26/2022 1506   GLUCOSE 202 (H) 08/26/2022 1506   BUN 53 (H) 08/26/2022 1506   CREATININE 2.40 (H) 08/26/2022 1506   CALCIUM  8.5 (L) 08/26/2022 1506   PROT 6.5 08/26/2022 1506   ALBUMIN 3.0 (L) 08/26/2022 1506   AST 22 08/26/2022 1506   ALT 16 08/26/2022 1506   ALKPHOS 67 08/26/2022 1506   BILITOT 0.6 08/26/2022 1506   GFRNONAA 27 (L) 08/26/2022 1506   GFRAA 40 (L) 04/04/2019 0427     No results found.     Assessment & Plan:   1. Peripheral arterial disease with history of revascularization (HCC)  Recommend:  The patient has evidence of atherosclerosis of the lower extremities with claudication.  The patient does not voice lifestyle limiting changes at this point in time.  Noninvasive studies do not suggest clinically significant change.  No invasive studies, angiography or surgery at this time The patient should continue walking and begin a more formal exercise program.  The patient should continue antiplatelet therapy and aggressive treatment of the lipid abnormalities  No changes in the patient's medications at this time  Continued surveillance is indicated as atherosclerosis is likely to progress with time.    The patient will continue follow up with noninvasive studies as ordered.   2. Hyperlipidemia associated with type 2 diabetes mellitus (HCC) Continue statin as ordered and reviewed, no changes at this time  3. Controlled type 2 diabetes mellitus with diabetic nephropathy, with long-term current use of insulin  (HCC) Continue hypoglycemic medications as already ordered, these medications have been reviewed and there are no changes  at this time.  Hgb A1C to be monitored as already arranged by primary service   4. Osteoarthritis of multiple joints, unspecified osteoarthritis type The patient notes that the orthopedic pain he is having are beginning to give him significant symptoms.  Based on that we will refer the patient to orthopedic surgery for further workup and evaluation. - Ambulatory referral to Orthopedic Surgery   Current Outpatient Medications on File Prior to Visit  Medication Sig Dispense Refill   albuterol  (PROVENTIL  HFA;VENTOLIN  HFA) 108 (90 Base) MCG/ACT inhaler Inhale 2 puffs into the lungs every 4 (four) hours as needed.      amLODipine  (NORVASC ) 5 MG tablet Take 1 tablet (5 mg total) by mouth daily. 30 tablet 0   Ascorbic Acid 500 MG CHEW Chew 500 mg by mouth.     B Complex-C (B-COMPLEX WITH VITAMIN C) tablet Take 1 tablet by mouth. Three times per week     bisoprolol  (ZEBETA ) 5 MG tablet Take 1 tablet (5 mg total) by mouth daily. 30 tablet 0   Cholecalciferol (VITAMIN D3) 50 MCG (2000 UT) TABS Take by mouth every other day.     clopidogrel  (PLAVIX ) 75 MG tablet Take 75 mg by mouth daily.      Cobalamin Combinations (VITAMIN B12-FOLIC ACID) 500-400 MCG TABS Take by mouth.     Dupilumab  300 MG/2ML SOPN Inject 300 mg into the skin every 14 (fourteen) days.     ezetimibe  (ZETIA ) 10 MG tablet Take 1 tablet (10 mg total) by mouth daily. 30 tablet 0   fenofibrate  160 MG tablet Take 160 mg by mouth daily.      Fluticasone -Umeclidin-Vilant (TRELEGY ELLIPTA) 100-62.5-25 MCG/INH AEPB Inhale 1 puff into the lungs daily.  furosemide  (LASIX ) 20 MG tablet Take 1 tablet (20 mg total) by mouth daily. 30 tablet 0   glipiZIDE (GLUCOTROL) 5 MG tablet Take 5 mg by mouth 2 (two) times daily before a meal.      insulin  glargine (LANTUS ) 100 UNIT/ML injection Inject 35 Units into the skin.     Magnesium  250 MG TABS Take 1 tablet by mouth.     Melatonin 5 MG CHEW Chew 5 mg by mouth at bedtime as needed.     metFORMIN  (GLUCOPHAGE) 500 MG tablet Take 500 mg by mouth 2 (two) times daily with a meal.      Multiple Vitamins-Minerals (MENS MULTIVITAMIN) CHEW Chew by mouth. Takes 2-3 days per week     mycophenolate  (CELLCEPT ) 250 MG capsule Take 250 mg by mouth 2 (two) times daily.     omega-3 acid ethyl esters (LOVAZA) 1 g capsule Take 1 g by mouth daily.     omeprazole (PRILOSEC) 20 MG capsule Take 20 mg by mouth daily.      rosuvastatin  (CRESTOR ) 10 MG tablet Take 1 tablet (10 mg total) by mouth at bedtime. 30 tablet 11   sildenafil (VIAGRA) 100 MG tablet Take 100 mg by mouth daily.     tamsulosin  (FLOMAX ) 0.4 MG CAPS capsule Take 0.4 mg by mouth daily.      thiamine (VITAMIN B1) 100 MG tablet Take 100 mg by mouth.     venlafaxine  (EFFEXOR ) 75 MG tablet Take 75 mg by mouth 3 (three) times daily.      vitamin E 180 MG (400 UNITS) capsule Take 400 Units by mouth. Takes 3 days per week     No current facility-administered medications on file prior to visit.    There are no Patient Instructions on file for this visit. No follow-ups on file.   Anne Sebring E Gurvir Schrom, NP

## 2024-03-18 LAB — VAS US ABI WITH/WO TBI
Left ABI: 0.95
Right ABI: 0.69

## 2024-03-19 ENCOUNTER — Encounter (INDEPENDENT_AMBULATORY_CARE_PROVIDER_SITE_OTHER): Payer: Self-pay

## 2024-03-28 ENCOUNTER — Encounter: Payer: Self-pay | Admitting: Internal Medicine

## 2025-03-14 ENCOUNTER — Encounter (INDEPENDENT_AMBULATORY_CARE_PROVIDER_SITE_OTHER)

## 2025-03-14 ENCOUNTER — Ambulatory Visit (INDEPENDENT_AMBULATORY_CARE_PROVIDER_SITE_OTHER): Admitting: Nurse Practitioner
